# Patient Record
Sex: Female | Born: 1955 | Smoking: Never smoker
Health system: Southern US, Community
[De-identification: ages and names within clinical notes are randomized; demographics above are authoritative.]

## PROBLEM LIST (undated history)

## (undated) DIAGNOSIS — N289 Disorder of kidney and ureter, unspecified: Secondary | ICD-10-CM

## (undated) DIAGNOSIS — D638 Anemia in other chronic diseases classified elsewhere: Secondary | ICD-10-CM

## (undated) DIAGNOSIS — N186 End stage renal disease: Secondary | ICD-10-CM

## (undated) DIAGNOSIS — I4510 Unspecified right bundle-branch block: Secondary | ICD-10-CM

## (undated) DIAGNOSIS — E785 Hyperlipidemia, unspecified: Secondary | ICD-10-CM

## (undated) DIAGNOSIS — E1151 Type 2 diabetes mellitus with diabetic peripheral angiopathy without gangrene: Secondary | ICD-10-CM

## (undated) DIAGNOSIS — I10 Essential (primary) hypertension: Secondary | ICD-10-CM

## (undated) DIAGNOSIS — I350 Nonrheumatic aortic (valve) stenosis: Secondary | ICD-10-CM

## (undated) DIAGNOSIS — E11319 Type 2 diabetes mellitus with unspecified diabetic retinopathy without macular edema: Secondary | ICD-10-CM

## (undated) DIAGNOSIS — E118 Type 2 diabetes mellitus with unspecified complications: Secondary | ICD-10-CM

## (undated) DIAGNOSIS — E119 Type 2 diabetes mellitus without complications: Secondary | ICD-10-CM

## (undated) HISTORY — PX: OTHER SURGICAL HISTORY: SHX169

---

## 2021-11-27 ENCOUNTER — Other Ambulatory Visit: Payer: Self-pay | Admitting: Student

## 2021-11-27 DIAGNOSIS — N186 End stage renal disease: Secondary | ICD-10-CM

## 2021-12-02 ENCOUNTER — Ambulatory Visit
Admission: RE | Admit: 2021-12-02 | Discharge: 2021-12-02 | Disposition: A | Discharge status: Home / Self Care | Payer: Medicare PPO | Source: Ambulatory Visit | Attending: Student | Admitting: Student

## 2021-12-02 DIAGNOSIS — N186 End stage renal disease: Secondary | ICD-10-CM | POA: Insufficient documentation

## 2022-03-25 ENCOUNTER — Other Ambulatory Visit
Admission: RE | Admit: 2022-03-25 | Discharge: 2022-03-25 | Disposition: A | Discharge status: Home / Self Care | Source: Ambulatory Visit | Attending: Nephrology | Admitting: Nephrology

## 2022-03-25 DIAGNOSIS — N186 End stage renal disease: Secondary | ICD-10-CM | POA: Insufficient documentation

## 2022-03-26 LAB — MISC LABCORP TEST (SEND OUT): Labcorp test code: 6510

## 2022-11-25 ENCOUNTER — Emergency Department: Payer: Medicare PPO

## 2022-11-25 ENCOUNTER — Observation Stay
Admission: EM | Admit: 2022-11-25 | Discharge: 2022-11-26 | Disposition: A | Discharge status: Home / Self Care | Payer: Medicare PPO | Attending: Internal Medicine | Admitting: Internal Medicine

## 2022-11-25 ENCOUNTER — Other Ambulatory Visit: Payer: Self-pay

## 2022-11-25 DIAGNOSIS — Z992 Dependence on renal dialysis: Secondary | ICD-10-CM | POA: Diagnosis not present

## 2022-11-25 DIAGNOSIS — R109 Unspecified abdominal pain: Secondary | ICD-10-CM | POA: Insufficient documentation

## 2022-11-25 DIAGNOSIS — R911 Solitary pulmonary nodule: Secondary | ICD-10-CM | POA: Insufficient documentation

## 2022-11-25 DIAGNOSIS — D631 Anemia in chronic kidney disease: Secondary | ICD-10-CM

## 2022-11-25 DIAGNOSIS — I350 Nonrheumatic aortic (valve) stenosis: Secondary | ICD-10-CM | POA: Diagnosis not present

## 2022-11-25 DIAGNOSIS — R079 Chest pain, unspecified: Secondary | ICD-10-CM | POA: Diagnosis present

## 2022-11-25 DIAGNOSIS — E119 Type 2 diabetes mellitus without complications: Secondary | ICD-10-CM

## 2022-11-25 DIAGNOSIS — N186 End stage renal disease: Secondary | ICD-10-CM

## 2022-11-25 DIAGNOSIS — I7025 Atherosclerosis of native arteries of other extremities with ulceration: Secondary | ICD-10-CM | POA: Insufficient documentation

## 2022-11-25 DIAGNOSIS — R918 Other nonspecific abnormal finding of lung field: Secondary | ICD-10-CM

## 2022-11-25 DIAGNOSIS — K2289 Other specified disease of esophagus: Secondary | ICD-10-CM

## 2022-11-25 DIAGNOSIS — I10 Essential (primary) hypertension: Secondary | ICD-10-CM

## 2022-11-25 DIAGNOSIS — Z79899 Other long term (current) drug therapy: Secondary | ICD-10-CM | POA: Insufficient documentation

## 2022-11-25 DIAGNOSIS — I120 Hypertensive chronic kidney disease with stage 5 chronic kidney disease or end stage renal disease: Secondary | ICD-10-CM | POA: Diagnosis not present

## 2022-11-25 DIAGNOSIS — L98499 Non-pressure chronic ulcer of skin of other sites with unspecified severity: Secondary | ICD-10-CM | POA: Diagnosis not present

## 2022-11-25 DIAGNOSIS — E042 Nontoxic multinodular goiter: Secondary | ICD-10-CM

## 2022-11-25 DIAGNOSIS — E1122 Type 2 diabetes mellitus with diabetic chronic kidney disease: Secondary | ICD-10-CM | POA: Insufficient documentation

## 2022-11-25 DIAGNOSIS — R7989 Other specified abnormal findings of blood chemistry: Secondary | ICD-10-CM | POA: Insufficient documentation

## 2022-11-25 DIAGNOSIS — Z7984 Long term (current) use of oral hypoglycemic drugs: Secondary | ICD-10-CM | POA: Insufficient documentation

## 2022-11-25 DIAGNOSIS — I70209 Unspecified atherosclerosis of native arteries of extremities, unspecified extremity: Secondary | ICD-10-CM

## 2022-11-25 HISTORY — DX: Disorder of kidney and ureter, unspecified: N28.9

## 2022-11-25 HISTORY — DX: Essential (primary) hypertension: I10

## 2022-11-25 HISTORY — DX: Type 2 diabetes mellitus without complications: E11.9

## 2022-11-25 LAB — COMPREHENSIVE METABOLIC PANEL
ALT: 15 U/L (ref 0–44)
AST: 20 U/L (ref 15–41)
Albumin: 4 g/dL (ref 3.5–5.0)
Alkaline Phosphatase: 117 U/L (ref 38–126)
Anion gap: 12 (ref 5–15)
BUN: 52 mg/dL — ABNORMAL HIGH (ref 8–23)
CO2: 26 mmol/L (ref 22–32)
Calcium: 9.4 mg/dL (ref 8.9–10.3)
Chloride: 104 mmol/L (ref 98–111)
Creatinine, Ser: 6.94 mg/dL — ABNORMAL HIGH (ref 0.44–1.00)
GFR, Estimated: 6 mL/min — ABNORMAL LOW (ref >60)
Glucose, Bld: 144 mg/dL — ABNORMAL HIGH (ref 70–99)
Potassium: 3.9 mmol/L (ref 3.5–5.1)
Sodium: 142 mmol/L (ref 135–145)
Total Bilirubin: 0.5 mg/dL (ref 0.3–1.2)
Total Protein: 7.6 g/dL (ref 6.5–8.1)

## 2022-11-25 LAB — CBC
HCT: 31.4 % — ABNORMAL LOW (ref 36.0–46.0)
Hemoglobin: 9.5 g/dL — ABNORMAL LOW (ref 12.0–15.0)
MCH: 31 pg (ref 26.0–34.0)
MCHC: 30.3 g/dL (ref 30.0–36.0)
MCV: 102.6 fL — ABNORMAL HIGH (ref 80.0–100.0)
Platelets: 208 10*3/uL (ref 150–400)
RBC: 3.06 MIL/uL — ABNORMAL LOW (ref 3.87–5.11)
RDW: 14.9 % (ref 11.5–15.5)
WBC: 8.1 10*3/uL (ref 4.0–10.5)
nRBC: 0 % (ref 0.0–0.2)

## 2022-11-25 LAB — LIPASE, BLOOD: Lipase: 34 U/L (ref 11–51)

## 2022-11-25 LAB — TROPONIN I (HIGH SENSITIVITY)
Troponin I (High Sensitivity): 24 ng/L — ABNORMAL HIGH (ref <18)
Troponin I (High Sensitivity): 26 ng/L — ABNORMAL HIGH (ref <18)

## 2022-11-25 LAB — HEPATITIS B SURFACE ANTIGEN: Hepatitis B Surface Ag: NONREACTIVE

## 2022-11-25 MED ORDER — TRAZODONE HCL 50 MG PO TABS: 50.0000 mg | ORAL_TABLET | Freq: Every evening | ORAL | Status: DC | PRN

## 2022-11-25 MED ORDER — MORPHINE SULFATE (PF) 2 MG/ML IV SOLN: 2.0000 mg | INTRAVENOUS | Status: DC | PRN

## 2022-11-25 MED ORDER — PENTAFLUOROPROP-TETRAFLUOROETH EX AERO
INHALATION_SPRAY | CUTANEOUS | Status: AC
  Filled 2022-11-25: qty 30

## 2022-11-25 MED ORDER — ACETAMINOPHEN 325 MG PO TABS: 650.0000 mg | ORAL_TABLET | ORAL | Status: DC | PRN

## 2022-11-25 MED ORDER — INSULIN ASPART 100 UNIT/ML IJ SOLN: 0.0000 [IU] | Freq: Three times a day (TID) | INTRAMUSCULAR | Status: DC

## 2022-11-25 MED ORDER — HYDRALAZINE HCL 20 MG/ML IJ SOLN: 5.0000 mg | Freq: Three times a day (TID) | INTRAMUSCULAR | Status: DC | PRN

## 2022-11-25 MED ORDER — HEPARIN SODIUM (PORCINE) 5000 UNIT/ML IJ SOLN
5000.0000 [IU] | Freq: Three times a day (TID) | INTRAMUSCULAR | Status: DC
  Administered 2022-11-26 (×3): 5000 [IU] via SUBCUTANEOUS
  Filled 2022-11-25 (×3): qty 1

## 2022-11-25 MED ORDER — IOHEXOL 350 MG/ML SOLN
100.0000 mL | Freq: Once | INTRAVENOUS | Status: AC | PRN
  Administered 2022-11-25: 100 mL via INTRAVENOUS

## 2022-11-25 MED ORDER — INSULIN ASPART 100 UNIT/ML IJ SOLN: 0.0000 [IU] | Freq: Every day | INTRAMUSCULAR | Status: DC

## 2022-11-25 MED ORDER — FENTANYL CITRATE PF 50 MCG/ML IJ SOSY: 25.0000 ug | PREFILLED_SYRINGE | INTRAMUSCULAR | Status: DC | PRN

## 2022-11-25 MED ORDER — ONDANSETRON HCL 4 MG/2ML IJ SOLN: 4.0000 mg | Freq: Four times a day (QID) | INTRAMUSCULAR | Status: DC | PRN

## 2022-11-25 NOTE — ED Triage Notes (Signed)
Pt to ED [facility] from dialysis for abd pain radiating to chest and back that felt tearing in sensation.  50 mcg fentanyl IM PTA 1.5 in nitro paste to left chest Had 41 mins dialysis.  Reports chronic numbness tingling in arms and legs from neuropathy

## 2022-11-25 NOTE — ED Notes (Signed)
Pt OTF to dialysis

## 2022-11-25 NOTE — ED Notes (Signed)
Difficulty obtaining IV, Dr Lenard Lance aware

## 2022-11-25 NOTE — ED Notes (Signed)
This RN attempted IV access x 1 unsuccessfully. EDP to attempt IV ultrasound.

## 2022-11-25 NOTE — Assessment & Plan Note (Signed)
Patient completed dialysis yesterday.  Continue same schedule.

## 2022-11-25 NOTE — Assessment & Plan Note (Signed)
Started on Protonix for acid reflux, hiatal hernia.  CT scan of the chest negative for dissection.  Patient chest pain-free upon discharge.  High-sensitivity troponin only borderline at 24 and 26.

## 2022-11-25 NOTE — Assessment & Plan Note (Addendum)
Multiple, Measuring up to 5 mm, incidentally found on CT imaging.  Patient is not a smoker.  Can get a repeat CT scan of the chest in 1 year

## 2022-11-25 NOTE — ED Provider Notes (Signed)
[facility] Provider Note    Event Date/Time   First MD Initiated Contact with Patient 11/25/22 [phone]     (approximate)  History   Chief Complaint: Chest Pain  HPI  Christy Curry is a 67 y.o. female with a past medical history of diabetes, hypertension, presents emergency department for chest and abdominal pain.  According to the patient she was receiving dialysis this morning when she developed pain in center of her chest radiating into her abdomen and up to each shoulder.  Patient describes the pain as a "tearing" sensation to the chest.  Patient received fentanyl and nitroglycerin prior to arrival and states the pain is decreased from a 10/10 to a 2/10 currently.  Physical Exam   Triage Vital Signs: ED Triage Vitals  Enc Vitals Group     BP 11/25/22 0855 (!) 174/67     Pulse Rate 11/25/22 0854 95     Resp 11/25/22 0854 18     Temp 11/25/22 0854 98.4 F (36.9 C)     Temp src --      SpO2 11/25/22 0854 97 %     Weight 11/25/22 0855 147 lb (66.7 kg)     Height 11/25/22 0855 5\' 2"  (1.575 m)     Head Circumference --      Peak Flow --      Pain Score 11/25/22 0848 1     Pain Loc --      Pain Edu? --      Excl. in GC? --     Most recent vital signs: Vitals:   11/25/22 0854 11/25/22 0855  BP:  (!) 174/67  Pulse: 95   Resp: 18   Temp: 98.4 F (36.9 C)   SpO2: 97%     General: Awake, no distress.  CV:  Good peripheral perfusion.  Regular rate and rhythm  Resp:  Normal effort.  Equal breath sounds bilaterally.  Abd:  No distention.  Soft, nontender.  No rebound or guarding. Other:  Upper extremity AV fistula   ED Results / Procedures / Treatments   EKG  EKG viewed and interpreted by myself shows normal sinus rhythm at 93 bpm with a narrow QRS, left axis deviation, largely normal intervals, nonspecific ST changes without ST elevation  RADIOLOGY  I reviewed interpret the CT images no obvious aortic dissection or aneurysm seen on  my evaluation. Radiology is read the CT is negative for aneurysm or dissection there is a small ulcer in the left subclavian artery.   MEDICATIONS ORDERED IN ED: Medications - No data to display   IMPRESSION / MDM / ASSESSMENT AND PLAN / ED COURSE  I reviewed the triage vital signs and the nursing notes.  Patient's presentation is most consistent with acute presentation with potential threat to life or bodily function.  Patient presents emergency department for chest and abdominal pain described as a tearing sensation although much improved after fentanyl by EMS.  Given the patient's description of the pain starting in the chest radiating into the abdomen and both shoulders and a tearing sensation concerning for possible aortic syndrome I discussed the patient with Dr. Cherylann Ratel of nephrology who recommends proceeding with a CTA dissection protocol and he will arrange for the patient to be dialyzed later today as an inpatient.  Recommends admission to the hospitalist service which I believe is reasonable given the patient's high risk chest pain regardless.  Will proceed with CTA imaging I have placed an ultrasound-guided 20-gauge IV  to the right upper extremity.  CTA is negative for any significant abnormality.  Patient's troponin is slightly elevated at 24 however she has end-stage renal disease, repeat at 26.  CBC overall reassuring chronic anemia, chemistry reassuring.  Lipase is normal.  Given the patient's chest pain with high risk patient will admit to the hospitalist service for further workup and treatment.  Spoke with nephrology who will plan to dialyze the patient this afternoon.  FINAL CLINICAL IMPRESSION(S) / ED DIAGNOSES   Chest pain Abdominal pain    Note:  This document was prepared using Dragon voice recognition software and may include unintentional dictation errors.   [name], MD 11/25/22 1527

## 2022-11-25 NOTE — Assessment & Plan Note (Signed)
Per CT read as patulous, distal wall thickening, may reflect sequelae of chronic dysmotility and esophagitis.  Protonix prescribed.

## 2022-11-25 NOTE — Progress Notes (Signed)
[facility]  ROUNDING NOTE   Subjective:   Christy Curry is a 67 y.o. female with past medical history of diabetes, hypertension, sleep apnea, and end-stage renal disease on hemodialysis.  Patient presents to the emergency department from their dialysis clinic complaining of chest pain and has been admitted for Chest pain [R07.9]  Patient is known to our practice and receives outpatient dialysis treatments at [facility] on a MWF schedule, supervised by Dr. Cherylann Ratel.  Patient states she was at dialysis treatment when she began feeling a pain that started in her upper abdomen and quickly radiated to her chest and both arms.  Patient denies nausea or vomiting.  Denies shortness of breath.  States she only had the pain.  Currently not experiencing any pain at this time.  Room air, no lower extremity edema.  Denies cough.  Labs on ED arrival unremarkable for renal patient.  Troponin 24.  CT angio chest negative for aortic aneurysm or dissection, coronary artery calcifications noted, bilateral thyroid nodules noted, and multiple small lung nodules also noted.  Patient denies smoking history however husband had extensive tobacco use history but has stopped 38 years ago.  We have been consulted to manage dialysis needs.   Objective:  Vital signs in last 24 hours:  Temp:  [98.1 F (36.7 C)-98.4 F (36.9 C)] 98.2 F (36.8 C) (06/05 1516) Pulse Rate:  [71-95] 78 (06/05 1830) Resp:  [13-20] 14 (06/05 1830) BP: (113-185)/(55-91) 128/60 (06/05 1830) SpO2:  [96 %-100 %] 99 % (06/05 1830) Weight:  [66.7 kg] 66.7 kg (06/05 0855)  Weight change:  Filed Weights   11/25/22 0855  Weight: 66.7 kg    Intake/Output: No intake/output data recorded.   Intake/Output this shift:  No intake/output data recorded.  Physical Exam: General: NAD  Head: Normocephalic, atraumatic. Moist oral mucosal membranes  Eyes: Anicteric  Neck: Supple, trachea midline  Lungs:  Clear to  auscultation, normal effort  Heart: Regular rate and rhythm  Abdomen:  Soft, nontender,   Extremities: No peripheral edema.  Neurologic: Nonfocal, moving all four extremities  Skin: No lesions  Access: Left AVG    Basic Metabolic Panel: Recent Labs  Lab 11/25/22 0857  NA 142  K 3.9  CL 104  CO2 26  GLUCOSE 144*  BUN 52*  CREATININE 6.94*  CALCIUM 9.4    Liver Function Tests: Recent Labs  Lab 11/25/22 0857  AST 20  ALT 15  ALKPHOS 117  BILITOT 0.5  PROT 7.6  ALBUMIN 4.0   Recent Labs  Lab 11/25/22 0857  LIPASE 34   No results for input(s): "AMMONIA" in the last 168 hours.  CBC: Recent Labs  Lab 11/25/22 0857  WBC 8.1  HGB 9.5*  HCT 31.4*  MCV 102.6*  PLT 208    Cardiac Enzymes: No results for input(s): "CKTOTAL", "CKMB", "CKMBINDEX", "TROPONINI" in the last 168 hours.  BNP: Invalid input(s): "POCBNP"  CBG: No results for input(s): "GLUCAP" in the last 168 hours.  Microbiology: No results found for this or any previous visit.  Coagulation Studies: No results for input(s): "LABPROT", "INR" in the last 72 hours.  Urinalysis: No results for input(s): "COLORURINE", "LABSPEC", "PHURINE", "GLUCOSEU", "HGBUR", "BILIRUBINUR", "KETONESUR", "PROTEINUR", "UROBILINOGEN", "NITRITE", "LEUKOCYTESUR" in the last 72 hours.  Invalid input(s): "APPERANCEUR"    Imaging: CT Angio Chest/Abd/Pel for Dissection W and/or Wo Contrast  Result Date: 11/25/2022 CLINICAL DATA:  Acute aortic syndrome suspected. Patient reports from dialysis with abdominal pain that radiates to chest and back  EXAM: CT ANGIOGRAPHY CHEST, ABDOMEN AND PELVIS TECHNIQUE: Non-contrast CT of the chest was initially obtained. Multidetector CT imaging through the chest, abdomen and pelvis was performed using the standard protocol during bolus administration of intravenous contrast. Multiplanar reconstructed images and MIPs were obtained and reviewed to evaluate the vascular anatomy. RADIATION DOSE  REDUCTION: This exam was performed according to the departmental dose-optimization program which includes automated exposure control, adjustment of the mA and/or kV according to patient size and/or use of iterative reconstruction technique. CONTRAST:  OMNIPAQUE IOHEXOL 350 MG/ML SOLN COMPARISON:  None Available. FINDINGS: CTA CHEST FINDINGS Cardiovascular: Preferential opacification of the thoracic aorta. No evidence of thoracic aortic aneurysm or dissection. Normal heart size. No pericardial effusion. There is a small penetrating atherosclerotic ulcer involving the proximal left subclavian artery, image 30/6. Aortic atherosclerosis. Coronary artery calcifications. No signs of central obstructing pulmonary embolism. Mediastinum/Nodes: Bilateral thyroid nodules are identified. The largest arises off the inferior pole of the right lobe measuring 1.5 cm. Trachea appears patent and midline. Patulous esophagus may reflect sequelae of chronic dysmotility. Wall thickening of the distal esophagus is noted. No enlarged mediastinal or hilar lymph nodes. Lungs/Pleura: No pleural effusion, interstitial edema, or pneumothorax. Several small lung nodules are identified. The largest is in the posterior left apex measuring 5 mm, image 27/7. Musculoskeletal: No chest wall abnormality. No acute or significant osseous findings. Scattered endplate Schmorl node deformities identified within the lower thoracic spine. Review of the MIP images confirms the above findings. CTA ABDOMEN AND PELVIS FINDINGS VASCULAR Aorta: Normal caliber aorta without aneurysm, dissection, vasculitis or significant stenosis. Aortic atherosclerotic calcifications Celiac: Patent without evidence of aneurysm, dissection, vasculitis or significant stenosis. SMA: Patent without evidence of aneurysm, dissection, vasculitis or significant stenosis. Renals: Focal narrowing of the proximal left renal artery noted with at least 50% stenosis. Approximately 40%  stenosis is noted at the origin of the right renal artery. No signs of dissection or vasculitis. IMA: Patent without evidence of aneurysm, dissection, vasculitis or significant stenosis. Inflow: Patent without evidence of aneurysm, dissection, vasculitis or significant stenosis. Veins: No obvious venous abnormality within the limitations of this arterial phase study. Review of the MIP images confirms the above findings. NON-VASCULAR Hepatobiliary: Left lobe of liver appears atrophic. No focal liver lesions identified. Cluster of stones calcifications in the region of the gallbladder neck noted, image 134/6. No signs of gallbladder wall inflammation. No bile duct dilatation. Pancreas: Unremarkable. No pancreatic ductal dilatation or surrounding inflammatory changes. Spleen: Normal in size without focal abnormality. Adrenals/Urinary Tract: Normal adrenal glands. Bilateral renal cortical scarring and atrophy. No nephrolithiasis or obstructive uropathy. Small bilateral kidney cysts. The largest arises off the anterior cortex of the interpolar right kidney measuring 1 cm, image 160/6. No hydronephrosis. Urinary bladder appears normal. Stomach/Bowel: Moderate size hiatal hernia. No pathologic dilatation of the large or small bowel loops. The appendix is visualized and appears normal. No bowel wall thickening or inflammation. Lymphatic: No abdominopelvic adenopathy. Reproductive: Uterus and bilateral adnexa are unremarkable. Other: No free fluid or fluid collections. No signs of pneumoperitoneum. No acute or suspicious osseous findings. Superior endplate Schmorl's node deformities are identified at T12, L1, L2 and L4. Inferior endplate Schmorl's node is also noted at L2. Musculoskeletal: No acute or significant osseous findings. Review of the MIP images confirms the above findings. IMPRESSION: 1. No evidence for aortic aneurysm or dissection. 2. 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 3. Coronary artery calcifications. 4. Bilateral thyroid nodules. The largest nodule  is in the inferior pole of the right lobe measuring 1.5 cm. Recommend non-emergent thyroid ultrasound. Reference: J Am Coll Radiol. 2015 Feb;12(2): 143-50 5. Bilateral renal cortical scarring and atrophy. 6. Cholelithiasis. 7. Moderate size hiatal hernia. 8. Multiple small lung nodules measuring up to 5 mm. Most severe: 5 mm. Per Fleischner Society Guidelines, if patient is low risk for malignancy, no routine follow-up imaging is recommended. If patient is high risk for malignancy, a non-contrast Chest CT at 12 months is optional. If performed and the nodule is stable at 12 months, no further follow-up is recommended. 9. Patulous esophagus with distal wall thickening may reflect sequelae of chronic dysmotility and esophagitis. 10. Aortic Atherosclerosis (ICD10-I70.0). Electronically Signed   By: Signa Kell M.D.   On: 11/25/2022 11:30     Medications:     heparin  5,000 Units Subcutaneous Q8H   insulin aspart  0-5 Units Subcutaneous QHS   insulin aspart  0-6 Units Subcutaneous TID WC   acetaminophen, fentaNYL (SUBLIMAZE) injection, hydrALAZINE, morphine injection, ondansetron (ZOFRAN) IV, traZODone  Assessment/ Plan:  Ms. Christy Curry is a 67 y.o.  female with past medical history of diabetes, hypertension, sleep apnea, and end-stage renal disease on hemodialysis.  Patient presents to the emergency department from their dialysis clinic complaining of chest pain and has been admitted for Chest pain [R07.9]  [facility]/MWF/Lt AVG  End stage renal disease on hemodialysis. Received 41 minutes of dialysis prior to chest pain. Attempting 1.3L at treatment. If workup negative, will complete scheduled dialysis.   2. Anemia of chronic kidney disease Lab Results  Component Value Date   HGB 9.5 (L) 11/25/2022   Hgb within desired range, patent receives Mircera at outpatient clinic.   3.  Secondary Hyperparathyroidism: with outpatient labs: PTH 688, phosphorus 6.7, calcium 8.8 on 11/02/22.   Lab Results  Component Value Date   CALCIUM 9.4 11/25/2022    Patient prescribed calcitriol outpatient  4.Hypertension with chronic kidney disease. Home regimen includes amlodipine, carvedilol, and losartan. All currently held.    LOS: 0 [place] [facility]o 6/5/20246:47 PM

## 2022-11-25 NOTE — H&P (Addendum)
History and Physical   Christy Curry:[id number] DOB: [birth year] DOA: 11/25/2022  PCP: Margorie John, MD  Patient coming from: Hemodialysis center via EMS  I have personally briefly reviewed patient's old medical records in [facility] EMR.  Chief Concern: Chest pain, abdominal pain  HPI: [name] is a 67 year old female with history of end-stage renal disease on hemodialysis, hypertension, non-insulin-dependent diabetes mellitus, obstructive sleep apnea, history of bilateral vitreous hemorrhage of both eyes status post vitrectomy in September 2023, who presents to emergency department from dialysis center for chief concerns of abdominal pain.  Vitals in the ED showed temperature 98.4, respiration rate of 18, heart rate of 95, blood pressure 174/67, SpO2 97% on room air.  Serum sodium is 142, potassium 3.9, chloride 104, bicarb 26, BUN of 52, serum creatinine of 6.94, EGFR of 6, nonfasting blood glucose 144.  WBC 8.1, hemoglobin 9.5, platelets of 2 8.  High sensitive troponin is 24, on repeat was 26.  CT chest abdomen pelvis with contrast for dissection: Read as no evidence of aortic aneurysm or dissection.  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  Coronary artery calcification.  Bilateral thyroid nodules.  Bilateral renal cortical scarring and atrophy.  Cholelithiasis.  Moderate sized hiatal hernia.  Multiple small lung nodules.  ED treatment: None ------------------------- At bedside, she was able to tell me her name, age, current year, current location.   She reports that during HD, she developed epigastric region, radiating up to her chest to bilateral shoulders. She denies associated shortness of breath, nausea, vomiting.  She denies trauma to her person.  She reports the last time she had pain like this before was after receiving anesthesia for procedure.  She denies swelling to her lower extremities, fever,  chills, diarrhea, syncope, vision changes.  Social history: She lives at home with her spouse.  She denies history of tobacco, EtOH, recreational drug use.  She formerly worked at [employer].   ROS: Constitutional: no weight change, no fever ENT/Mouth: no sore throat, no rhinorrhea Eyes: no eye pain, no vision changes Cardiovascular: + chest pain, no dyspnea,  no edema, no palpitations Respiratory: no cough, no sputum, no wheezing Gastrointestinal: no nausea, no vomiting, no diarrhea, no constipation Genitourinary: no urinary incontinence, no dysuria, no hematuria Musculoskeletal: no arthralgias, no myalgias Skin: no skin lesions, no pruritus, Neuro: no  weakness, no loss of consciousness, no syncope Psych: no anxiety, no depression, no decrease appetite Heme/Lymph: no bruising, no bleeding  ED Course: Discussed with emergency medicine provider, patient requiring hospitalization for chief concerns of abdominal pain.  Assessment/Plan  Principal Problem:   Chest pain Active Problems:   Essential hypertension   Pulmonary nodules   Esophageal thickening   Anemia due to chronic kidney disease   Diabetes mellitus type 2, noninsulin dependent (HCC)   Elevated troponin   Atherosclerotic peripheral vascular disease with ulceration (HCC)   ESRD on dialysis (HCC)   Assessment and Plan:  * Chest pain Elevated high sensitive troponin Etiology workup in progress at this time differentials include ACS versus esophagitis Complete echo ordered Admit to telemetry cardiac, observation  ESRD on dialysis [facility]) Nephrology aware, patient received dialysis on day of admission  Elevated troponin Complete echo ordered AM team to consult cardiology pending complete echo  Diabetes mellitus type 2, noninsulin dependent (HCC) Home glipizide not resumed on admission Insulin SSI with agents coverage ordered Goal inpatient blood glucose levels 140-180  Esophageal thickening Per CT read  as patulous,  distal wall thickening, may reflect sequelae of chronic dysmotility and esophagitis  Pulmonary nodules Multiple Measuring up to 5 mm, incidentally found on CT imaging Discussed with patient at bedside and patient is aware  Chart reviewed.   DVT prophylaxis: 5000 units every 8 hours Code Status: Full code Diet: Renal/carb modified Family Communication: No Disposition Plan: pending clinical course, pending complete echo evaluation Consults called: Nephrolog for dialysis Admission status: Telemetry cardiac, observation  Past Medical History:  Diagnosis Date   Diabetes mellitus without complication (HCC)    Hypertension    Renal disorder    Past Surgical History:  Procedure Laterality Date   av fistula Left    Social History:  reports that she has never smoked. She has never used smokeless tobacco. She reports that she does not drink alcohol and does not use drugs.  Allergies  Allergen Reactions   2,4-D Dimethylamine Swelling   Tetanus Toxoids Itching   Family History  Problem Relation Age of Onset   Kidney disease Mother    Diabetes Father    Family history: Family history reviewed and not pertinent.  Prior to Admission medications   Medication Sig Start Date End Date Taking? Authorizing Provider  amLODipine (NORVASC) 5 MG tablet Take 5 mg by mouth daily. 06/02/19  Yes [provider]  b complex-vitamin c-folic acid (NEPHRO-VITE) 0.8 MG TABS tablet Take 1 tablet by mouth daily. 09/02/22  Yes [provider]  carvedilol (COREG) 25 MG tablet Take 1 tablet by mouth 2 (two) times daily with a meal. 03/29/19  Yes [provider]  glipiZIDE (GLUCOTROL XL) 2.5 MG 24 hr tablet Take 1 tablet by mouth daily. 07/27/22  Yes [provider]  losartan (COZAAR) 100 MG tablet Take 100 mg by mouth daily.    [provider]   Physical Exam: Vitals:   11/25/22 1600 11/25/22 1630 11/25/22 1700 11/25/22 1730  BP: (!) 169/71 (!) 141/71  131/73 137/76  Pulse: 71 74 76 74  Resp: 13 17 18 16   Temp:      TempSrc:      SpO2: 100% 99% 100% 100%  Weight:      Height:       Constitutional: appears age-appropriate, NAD , calm Eyes: PERRL, lids and conjunctivae normal ENMT: Mucous membranes are moist. Posterior pharynx clear of any exudate or lesions. Age-appropriate dentition. Hearing appropriate Neck: normal, supple, no masses, no thyromegaly Respiratory: clear to auscultation bilaterally, no wheezing, no crackles. Normal respiratory effort. No accessory muscle use.  Cardiovascular: Regular rate and rhythm, no murmurs / rubs / gallops. No extremity edema. 2+ pedal pulses. No carotid bruits.  Fistula with appropriate bruit in the left upper extremity Abdomen: Obese abdomen, no tenderness, no masses palpated, no hepatosplenomegaly. Bowel sounds positive.  Musculoskeletal: no clubbing / cyanosis. No joint deformity upper and lower extremities. Good ROM, no contractures, no atrophy. Normal muscle tone.  Skin: no rashes, lesions, ulcers. No induration Neurologic: Sensation intact. Strength 5/5 in all 4.  Psychiatric: Normal judgment and insight. Alert and oriented x 3. Normal mood.   EKG: independently reviewed, showing sinus rhythm with rate of 93, QTc 479  Chest x-ray on Admission: I personally reviewed and I agree with radiologist reading as below.  CT Angio Chest/Abd/Pel for Dissection W and/or Wo Contrast  Result Date: 11/25/2022 CLINICAL DATA:  Acute aortic syndrome suspected. Patient reports from dialysis with abdominal pain that radiates to chest and back EXAM: CT ANGIOGRAPHY CHEST, ABDOMEN AND PELVIS TECHNIQUE: Non-contrast CT of the  chest was initially obtained. Multidetector CT imaging through the chest, abdomen and pelvis was performed using the standard protocol during bolus administration of intravenous contrast. Multiplanar reconstructed images and MIPs were obtained and reviewed to evaluate the vascular anatomy.  RADIATION DOSE REDUCTION: This exam was performed according to the departmental dose-optimization program which includes automated exposure control, adjustment of the mA and/or kV according to patient size and/or use of iterative reconstruction technique. CONTRAST:  OMNIPAQUE IOHEXOL 350 MG/ML SOLN COMPARISON:  None Available. FINDINGS: CTA CHEST FINDINGS Cardiovascular: Preferential opacification of the thoracic aorta. No evidence of thoracic aortic aneurysm or dissection. Normal heart size. No pericardial effusion. There is a small penetrating atherosclerotic ulcer involving the proximal left subclavian artery, image 30/6. Aortic atherosclerosis. Coronary artery calcifications. No signs of central obstructing pulmonary embolism. Mediastinum/Nodes: Bilateral thyroid nodules are identified. The largest arises off the inferior pole of the right lobe measuring 1.5 cm. Trachea appears patent and midline. Patulous esophagus may reflect sequelae of chronic dysmotility. Wall thickening of the distal esophagus is noted. No enlarged mediastinal or hilar lymph nodes. Lungs/Pleura: No pleural effusion, interstitial edema, or pneumothorax. Several small lung nodules are identified. The largest is in the posterior left apex measuring 5 mm, image 27/7. Musculoskeletal: No chest wall abnormality. No acute or significant osseous findings. Scattered endplate Schmorl node deformities identified within the lower thoracic spine. Review of the MIP images confirms the above findings. CTA ABDOMEN AND PELVIS FINDINGS VASCULAR Aorta: Normal caliber aorta without aneurysm, dissection, vasculitis or significant stenosis. Aortic atherosclerotic calcifications Celiac: Patent without evidence of aneurysm, dissection, vasculitis or significant stenosis. SMA: Patent without evidence of aneurysm, dissection, vasculitis or significant stenosis. Renals: Focal narrowing of the proximal left renal artery noted with at least 50% stenosis.  Approximately 40% stenosis is noted at the origin of the right renal artery. No signs of dissection or vasculitis. IMA: Patent without evidence of aneurysm, dissection, vasculitis or significant stenosis. Inflow: Patent without evidence of aneurysm, dissection, vasculitis or significant stenosis. Veins: No obvious venous abnormality within the limitations of this arterial phase study. Review of the MIP images confirms the above findings. NON-VASCULAR Hepatobiliary: Left lobe of liver appears atrophic. No focal liver lesions identified. Cluster of stones calcifications in the region of the gallbladder neck noted, image 134/6. No signs of gallbladder wall inflammation. No bile duct dilatation. Pancreas: Unremarkable. No pancreatic ductal dilatation or surrounding inflammatory changes. Spleen: Normal in size without focal abnormality. Adrenals/Urinary Tract: Normal adrenal glands. Bilateral renal cortical scarring and atrophy. No nephrolithiasis or obstructive uropathy. Small bilateral kidney cysts. The largest arises off the anterior cortex of the interpolar right kidney measuring 1 cm, image 160/6. No hydronephrosis. Urinary bladder appears normal. Stomach/Bowel: Moderate size hiatal hernia. No pathologic dilatation of the large or small bowel loops. The appendix is visualized and appears normal. No bowel wall thickening or inflammation. Lymphatic: No abdominopelvic adenopathy. Reproductive: Uterus and bilateral adnexa are unremarkable. Other: No free fluid or fluid collections. No signs of pneumoperitoneum. No acute or suspicious osseous findings. Superior endplate Schmorl's node deformities are identified at T12, L1, L2 and L4. Inferior endplate Schmorl's node is also noted at L2. Musculoskeletal: No acute or significant osseous findings. Review of the MIP images confirms the above findings. IMPRESSION: 1. No evidence for aortic aneurysm or dissection. 2. Small penetrating atherosclerotic ulcer involving the  proximal left subclavian artery. 3. Coronary artery calcifications. 4. Bilateral thyroid nodules. The largest nodule is in the inferior pole of the right lobe measuring 1.5 cm.  Recommend non-emergent thyroid ultrasound. Reference: J Am Coll Radiol. 2015 Feb;12(2): 143-50 5. Bilateral renal cortical scarring and atrophy. 6. Cholelithiasis. 7. Moderate size hiatal hernia. 8. Multiple small lung nodules measuring up to 5 mm. Most severe: 5 mm. Per Fleischner Society Guidelines, if patient is low risk for malignancy, no routine follow-up imaging is recommended. If patient is high risk for malignancy, a non-contrast Chest CT at 12 months is optional. If performed and the nodule is stable at 12 months, no further follow-up is recommended. 9. Patulous esophagus with distal wall thickening may reflect sequelae of chronic dysmotility and esophagitis. 10. Aortic Atherosclerosis (ICD10-I70.0). Electronically Signed   By: Signa Kell M.D.   On: 11/25/2022 11:30    Labs on Admission: I have personally reviewed following labs  CBC: Recent Labs  Lab 11/25/22 0857  WBC 8.1  HGB 9.5*  HCT 31.4*  MCV 102.6*  PLT 208   Basic Metabolic Panel: Recent Labs  Lab 11/25/22 0857  NA 142  K 3.9  CL 104  CO2 26  GLUCOSE 144*  BUN 52*  CREATININE 6.94*  CALCIUM 9.4   GFR: Estimated Creatinine Clearance: 7 mL/min (A) (by C-G formula based on SCr of 6.94 mg/dL (H)).  Liver Function Tests: Recent Labs  Lab 11/25/22 0857  AST 20  ALT 15  ALKPHOS 117  BILITOT 0.5  PROT 7.6  ALBUMIN 4.0   Recent Labs  Lab 11/25/22 0857  LIPASE 34   This document was prepared using Dragon Voice Recognition software and may include unintentional dictation errors.  Dr. Sedalia Muta [facility]  If 7PM-7AM, please contact overnight-coverage provider If 7AM-7PM, please contact day attending provider [email or web address]  11/25/2022, 5:44 PM

## 2022-11-25 NOTE — Progress Notes (Signed)
Received patient in bed to unit.    Informed consent signed and in chart.    TX duration:3     Transported By hospital transport back to ER Hand-off given to patient's nurse.    Access used: L Graft Access issues:none    Total UF removed:1.5  Medication(s) given:None Post HD VS: Stable Post HD weight:65.2KG     [facility]

## 2022-11-25 NOTE — Hospital Course (Signed)
[name] is a 67 year old female with history of end-stage renal disease on hemodialysis, hypertension, non-insulin-dependent diabetes mellitus, obstructive sleep apnea, history of bilateral vitreous hemorrhage of both eyes status post vitrectomy in September 2023, who presents to emergency department from dialysis center for chief concerns of abdominal pain.  Vitals in the ED showed temperature 98.4, respiration rate of 18, heart rate of 95, blood pressure 174/67, SpO2 97% on room air.  Serum sodium is 142, potassium 3.9, chloride 104, bicarb 26, BUN of 52, serum creatinine of 6.94, EGFR of 6, nonfasting blood glucose 144.  WBC 8.1, hemoglobin 9.5, platelets of 2 8.  High sensitive troponin is 24, on repeat was 26.  CT chest abdomen pelvis with contrast for dissection: Read as no evidence of aortic aneurysm or dissection.  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  Coronary artery calcification.  Bilateral thyroid nodules.  Bilateral renal cortical scarring and atrophy.  Cholelithiasis.  Moderate sized hiatal hernia.  Multiple small lung nodules.  ED treatment: None

## 2022-11-25 NOTE — Assessment & Plan Note (Signed)
Complete echo ordered AM team to consult cardiology pending complete echo

## 2022-11-25 NOTE — ED Notes (Signed)
RN contacted lab to check on status of troponin, lab states troponin still running and should be finished soon.

## 2022-11-25 NOTE — Assessment & Plan Note (Signed)
Home glipizide not resumed on admission Insulin SSI with agents coverage ordered Goal inpatient blood glucose levels 140-180

## 2022-11-26 ENCOUNTER — Observation Stay (HOSPITAL_BASED_OUTPATIENT_CLINIC_OR_DEPARTMENT_OTHER)
Admit: 2022-11-26 | Discharge: 2022-11-26 | Disposition: A | Discharge status: Home / Self Care | Payer: Medicare PPO | Attending: Internal Medicine | Admitting: Internal Medicine

## 2022-11-26 DIAGNOSIS — R079 Chest pain, unspecified: Secondary | ICD-10-CM

## 2022-11-26 DIAGNOSIS — D631 Anemia in chronic kidney disease: Secondary | ICD-10-CM

## 2022-11-26 DIAGNOSIS — Z992 Dependence on renal dialysis: Secondary | ICD-10-CM

## 2022-11-26 DIAGNOSIS — E119 Type 2 diabetes mellitus without complications: Secondary | ICD-10-CM

## 2022-11-26 DIAGNOSIS — I350 Nonrheumatic aortic (valve) stenosis: Secondary | ICD-10-CM

## 2022-11-26 DIAGNOSIS — K2289 Other specified disease of esophagus: Secondary | ICD-10-CM

## 2022-11-26 DIAGNOSIS — I10 Essential (primary) hypertension: Secondary | ICD-10-CM

## 2022-11-26 DIAGNOSIS — R918 Other nonspecific abnormal finding of lung field: Secondary | ICD-10-CM

## 2022-11-26 DIAGNOSIS — R7989 Other specified abnormal findings of blood chemistry: Secondary | ICD-10-CM

## 2022-11-26 DIAGNOSIS — E042 Nontoxic multinodular goiter: Secondary | ICD-10-CM

## 2022-11-26 DIAGNOSIS — N186 End stage renal disease: Secondary | ICD-10-CM

## 2022-11-26 LAB — CBC
HCT: 31.3 % — ABNORMAL LOW (ref 36.0–46.0)
Hemoglobin: 9.6 g/dL — ABNORMAL LOW (ref 12.0–15.0)
MCH: 31.2 pg (ref 26.0–34.0)
MCHC: 30.7 g/dL (ref 30.0–36.0)
MCV: 101.6 fL — ABNORMAL HIGH (ref 80.0–100.0)
Platelets: 203 10*3/uL (ref 150–400)
RBC: 3.08 MIL/uL — ABNORMAL LOW (ref 3.87–5.11)
RDW: 14.8 % (ref 11.5–15.5)
WBC: 7.4 10*3/uL (ref 4.0–10.5)
nRBC: 0 % (ref 0.0–0.2)

## 2022-11-26 LAB — ECHOCARDIOGRAM COMPLETE
AR max vel: 1.07 cm2
AV Area VTI: 1.26 cm2
AV Area mean vel: 1.05 cm2
AV Mean grad: 18.3 mmHg
AV Peak grad: 32.8 mmHg
Ao pk vel: 2.86 m/s
Area-P 1/2: 2.48 cm2
Height: 62 in
MV VTI: 2.6 cm2
S' Lateral: 2.8 cm
Weight: 2299.84 oz

## 2022-11-26 LAB — HEPATITIS B SURFACE ANTIBODY, QUANTITATIVE: Hep B S AB Quant (Post): 56.7 m[IU]/mL (ref >9.9)

## 2022-11-26 LAB — BASIC METABOLIC PANEL
Anion gap: 10 (ref 5–15)
BUN: 32 mg/dL — ABNORMAL HIGH (ref 8–23)
CO2: 28 mmol/L (ref 22–32)
Calcium: 9.3 mg/dL (ref 8.9–10.3)
Chloride: 101 mmol/L (ref 98–111)
Creatinine, Ser: 5.51 mg/dL — ABNORMAL HIGH (ref 0.44–1.00)
GFR, Estimated: 8 mL/min — ABNORMAL LOW (ref >60)
Glucose, Bld: 135 mg/dL — ABNORMAL HIGH (ref 70–99)
Potassium: 4.3 mmol/L (ref 3.5–5.1)
Sodium: 139 mmol/L (ref 135–145)

## 2022-11-26 LAB — CBG MONITORING, ED
Glucose-Capillary: 148 mg/dL — ABNORMAL HIGH (ref 70–99)
Glucose-Capillary: 185 mg/dL — ABNORMAL HIGH (ref 70–99)
Glucose-Capillary: 94 mg/dL (ref 70–99)

## 2022-11-26 LAB — TSH: TSH: 4.992 u[IU]/mL — ABNORMAL HIGH (ref 0.350–4.500)

## 2022-11-26 MED ORDER — AMLODIPINE BESYLATE 5 MG PO TABS
5.0000 mg | ORAL_TABLET | Freq: Every day | ORAL | Status: DC
  Administered 2022-11-26: 5 mg via ORAL
  Filled 2022-11-26: qty 1

## 2022-11-26 MED ORDER — CARVEDILOL 6.25 MG PO TABS
25.0000 mg | ORAL_TABLET | Freq: Two times a day (BID) | ORAL | Status: DC
  Administered 2022-11-26: 25 mg via ORAL
  Filled 2022-11-26: qty 4

## 2022-11-26 MED ORDER — LOSARTAN POTASSIUM 50 MG PO TABS
100.0000 mg | ORAL_TABLET | Freq: Every day | ORAL | Status: DC
  Administered 2022-11-26: 100 mg via ORAL
  Filled 2022-11-26: qty 2

## 2022-11-26 MED ORDER — PANTOPRAZOLE SODIUM 40 MG PO TBEC
40.0000 mg | DELAYED_RELEASE_TABLET | Freq: Every day | ORAL | Status: DC
  Administered 2022-11-26: 40 mg via ORAL
  Filled 2022-11-26: qty 1

## 2022-11-26 MED ORDER — PANTOPRAZOLE SODIUM 40 MG PO TBEC: 40.0000 mg | DELAYED_RELEASE_TABLET | Freq: Every day | ORAL | 0 refills | Status: DC

## 2022-11-26 NOTE — Progress Notes (Signed)
*  PRELIMINARY RESULTS* Echocardiogram 2D Echocardiogram has been performed.  Christy Curry 11/26/2022, 8:44 AM

## 2022-11-26 NOTE — Assessment & Plan Note (Signed)
Moderate as per echocardiogram.  Follow-up with cardiology as outpatient.  Try not to take off too much fluid at a fast rate as per cardiology.

## 2022-11-26 NOTE — Discharge Summary (Signed)
Physician Discharge Summary   Patient: Christy Curry MRN: [id number] DOB: Nov 03, 1955  Admit date:     11/25/2022  Discharge date: 11/26/22  Discharge Physician: Alford Highland   PCP: Margorie John, MD   Recommendations at discharge:   Follow-up PCP 5 days Follow-up cardiology 3 to 4 weeks Can obtain a CT scan of the chest in 1 year to follow-up pulmonary nodules.  Discharge Diagnoses: Principal Problem:   Chest pain Active Problems:   Aortic stenosis   ESRD on dialysis [facility])   Essential hypertension   Anemia due to chronic kidney disease   Diabetes mellitus type 2, noninsulin dependent (HCC)   Pulmonary nodules   Atherosclerotic peripheral vascular disease with ulceration (HCC)   Esophageal thickening   Multiple thyroid nodules    Hospital Course: Christy Curry is a 67 year old female with history of end-stage renal disease on hemodialysis, hypertension, non-insulin-dependent diabetes mellitus, obstructive sleep apnea, history of bilateral vitreous hemorrhage of both eyes status post vitrectomy in September 2023, who presents to emergency department from dialysis center for chief concerns of abdominal pain.  Vitals in the ED showed temperature 98.4, respiration rate of 18, heart rate of 95, blood pressure 174/67, SpO2 97% on room air.  Serum sodium is 142, potassium 3.9, chloride 104, bicarb 26, BUN of 52, serum creatinine of 6.94, EGFR of 6, nonfasting blood glucose 144.  WBC 8.1, hemoglobin 9.5, platelets of 2 8.  High sensitive troponin is 24, on repeat was 26.  CT chest abdomen pelvis with contrast for dissection: Read as no evidence of aortic aneurysm or dissection.  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  Coronary artery calcification.  Bilateral thyroid nodules.  Bilateral renal cortical scarring and atrophy.  Cholelithiasis.  Moderate sized hiatal hernia.  Multiple small lung nodules.  6/6.  Patient  feeling better.  No chest pain or abdominal pain.  Patient describes the pain starting in her upper abdomen and going up into her chest and radiating to both shoulders.  Started with dialysis.  Resolved now.  Walk back and forth to the bathroom without any symptoms.  Echocardiogram showed a normal EF of 60% with moderate aortic stenosis.  Assessment and Plan: * Chest pain Started on Protonix for acid reflux, hiatal hernia.  CT scan of the chest negative for dissection.  Patient chest pain-free upon discharge.  High-sensitivity troponin only borderline at 24 and 26.  Aortic stenosis Moderate as per echocardiogram.  Follow-up with cardiology as outpatient.  Try not to take off too much fluid at a fast rate as per cardiology.  ESRD on dialysis [facility]) Patient completed dialysis yesterday.  Continue same schedule.  Essential hypertension Continue Norvasc Coreg and losartan  Anemia due to chronic kidney disease Hemoglobin stable at 9.6.  Diabetes mellitus type 2, noninsulin dependent (HCC) Can go back on glipizide but careful watching sugars as outpatient.  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  Case discussed with vascular surgery on-call and recommended outpatient follow-up.  The patient wanted to follow-up with her vascular surgeon.  Pulmonary nodules Multiple, Measuring up to 5 mm, incidentally found on CT imaging.  Patient is not a smoker.  Can get a repeat CT scan of the chest in 1 year  Esophageal thickening Per CT read as patulous, distal wall thickening, may reflect sequelae of chronic dysmotility and esophagitis.  Protonix prescribed.  Multiple thyroid nodules Patient already knows about this and has  had biopsies in the past.  TSH slightly elevated 4.992.  Continue to watch as outpatient.         Consultants: Nephrology.  (Case discussed with cardiology and  vascular surgery). Procedures performed: Dialysis Disposition: Home Diet recommendation:  Cardiac and Carb modified diet DISCHARGE MEDICATION: Allergies as of 11/26/2022       Reactions   2,4-d Dimethylamine Swelling   Tetanus Toxoids Itching        Medication List     TAKE these medications    amLODipine 5 MG tablet Commonly known as: NORVASC Take 5 mg by mouth daily.   b complex-vitamin c-folic acid 0.8 MG Tabs tablet Take 1 tablet by mouth daily.   carvedilol 25 MG tablet Commonly known as: COREG Take 25 mg by mouth 2 (two) times daily with a meal.   glipiZIDE 2.5 MG 24 hr tablet Commonly known as: GLUCOTROL XL Take 2.5 mg by mouth daily.   losartan 100 MG tablet Commonly known as: COZAAR Take 100 mg by mouth daily.   pantoprazole 40 MG tablet Commonly known as: PROTONIX Take 1 tablet (40 mg total) by mouth daily. Start taking on: November 27, 2022        Follow-up Information     Margorie John, MD Follow up in 5 day(s).   Specialty: Internal Medicine Contact information: [address] [place] [address] [phone]         Antonieta Iba, MD Follow up in 3 week(s).   Specialty: Cardiology Contact information: [address] [place] [address] [place] [address] [phone]                Discharge Exam: Ceasar Mons Weights   11/25/22 0855 11/25/22 1916  Weight: 66.7 kg 65.2 kg   Physical Exam HENT:     Head: Normocephalic.     Mouth/Throat:     Pharynx: No oropharyngeal exudate.  Eyes:     General: Lids are normal.     Conjunctiva/sclera: Conjunctivae normal.  Cardiovascular:     Rate and Rhythm: Normal rate and regular rhythm.     Heart sounds: S1 normal and S2 normal. Murmur heard.     Systolic murmur is present with a grade of 2/6.  Pulmonary:     Breath sounds: No decreased breath sounds, wheezing, rhonchi or rales.  Abdominal:     Palpations: Abdomen is soft.     Tenderness: There is no abdominal tenderness.   Musculoskeletal:     Right lower leg: No swelling.     Left lower leg: No swelling.  Skin:    General: Skin is warm.     Findings: No rash.  Neurological:     Mental Status: She is alert and oriented to person, place, and time.      Condition at discharge: stable  The results of significant diagnostics from this hospitalization (including imaging, microbiology, ancillary and laboratory) are listed below for reference.   Imaging Studies: ECHOCARDIOGRAM COMPLETE  Result Date: 11/26/2022    ECHOCARDIOGRAM REPORT   Patient Name:   Christy Curry Date of Exam: 11/26/2022 Medical Rec #:  [id number]               Height:       62.0 in Accession #:    [id number]              Weight:       143.7 lb Date of Birth:  [birth year]  BSA:          1.661 m Patient Age:    66 years                BP:           140/46 mmHg Patient Gender: F                       HR:           70 bpm. Exam Location:  [facility] Procedure: 2D Echo, Color Doppler and Cardiac Doppler Indications:     Chest pain R07.9                  Elevated Troponin  History:         Patient has no prior history of Echocardiogram examinations.                  Risk Factors:Diabetes and Hypertension.  Sonographer:     Cristela Blue Referring Phys:  [id number] AMY N COX Diagnosing Phys: Julien Nordmann MD IMPRESSIONS  1. Left ventricular ejection fraction, by estimation, is 60 to 65%. The left ventricle has normal function. The left ventricle has no regional wall motion abnormalities. There is moderate left ventricular hypertrophy. Left ventricular diastolic parameters are consistent with Grade I diastolic dysfunction (impaired relaxation).  2. Right ventricular systolic function is normal. The right ventricular size is normal. There is normal pulmonary artery systolic pressure. The estimated right ventricular systolic pressure is 24.5 mmHg.  3. The mitral valve is normal in structure. No evidence of mitral valve regurgitation. No evidence of  mitral stenosis.  4. The aortic valve is normal in structure. There is severe calcifcation of the aortic valve. Aortic valve regurgitation is not visualized. Moderate aortic valve stenosis. Aortic valve area, by VTI measures 1.26 cm. Aortic valve mean gradient measures 18.3 mmHg. Aortic valve Vmax measures 2.86 m/s.  5. The inferior vena cava is normal in size with greater than 50% respiratory variability, suggesting right atrial pressure of 3 mmHg. FINDINGS  Left Ventricle: Left ventricular ejection fraction, by estimation, is 60 to 65%. The left ventricle has normal function. The left ventricle has no regional wall motion abnormalities. The left ventricular internal cavity size was normal in size. There is  moderate left ventricular hypertrophy. Left ventricular diastolic parameters are consistent with Grade I diastolic dysfunction (impaired relaxation). Right Ventricle: The right ventricular size is normal. No increase in right ventricular wall thickness. Right ventricular systolic function is normal. There is normal pulmonary artery systolic pressure. The tricuspid regurgitant velocity is 2.21 m/s, and  with an assumed right atrial pressure of 5 mmHg, the estimated right ventricular systolic pressure is 24.5 mmHg. Left Atrium: Left atrial size was normal in size. Right Atrium: Right atrial size was normal in size. Pericardium: There is no evidence of pericardial effusion. Mitral Valve: The mitral valve is normal in structure. Mild mitral annular calcification. No evidence of mitral valve regurgitation. No evidence of mitral valve stenosis. MV peak gradient, 7.4 mmHg. The mean mitral valve gradient is 3.0 mmHg. Tricuspid Valve: The tricuspid valve is normal in structure. Tricuspid valve regurgitation is not demonstrated. No evidence of tricuspid stenosis. Aortic Valve: The aortic valve is normal in structure. There is severe calcifcation of the aortic valve. Aortic valve regurgitation is not visualized. Moderate  aortic stenosis is present. Aortic valve mean gradient measures 18.3 mmHg. Aortic valve peak gradient measures 32.8 mmHg. Aortic valve area, by VTI measures 1.26  cm. Pulmonic Valve: The pulmonic valve was normal in structure. Pulmonic valve regurgitation is not visualized. No evidence of pulmonic stenosis. Aorta: The aortic root is normal in size and structure. Venous: The inferior vena cava is normal in size with greater than 50% respiratory variability, suggesting right atrial pressure of 3 mmHg. IAS/Shunts: No atrial level shunt detected by color flow Doppler.  LEFT VENTRICLE PLAX 2D LVIDd:         4.20 cm   Diastology LVIDs:         2.80 cm   LV e' medial:    6.31 cm/s LV PW:         1.10 cm   LV E/e' medial:  16.0 LV IVS:        1.50 cm   LV e' lateral:   7.40 cm/s LVOT diam:     2.00 cm   LV E/e' lateral: 13.6 LV SV:         76 LV SV Index:   46 LVOT Area:     3.14 cm  RIGHT VENTRICLE RV Basal diam:  3.50 cm RV Mid diam:    2.30 cm RV S prime:     10.70 cm/s TAPSE (M-mode): 1.8 cm LEFT ATRIUM             Index        RIGHT ATRIUM          Index LA diam:        3.50 cm 2.11 cm/m   RA Area:     8.87 cm LA Vol (A2C):   29.5 ml 17.76 ml/m  RA Volume:   16.80 ml 10.11 ml/m LA Vol (A4C):   38.9 ml 23.42 ml/m LA Biplane Vol: 34.7 ml 20.89 ml/m  AORTIC VALVE AV Area (Vmax):    1.07 cm AV Area (Vmean):   1.05 cm AV Area (VTI):     1.26 cm AV Vmax:           286.33 cm/s AV Vmean:          194.333 cm/s AV VTI:            0.604 m AV Peak Grad:      32.8 mmHg AV Mean Grad:      18.3 mmHg LVOT Vmax:         97.40 cm/s LVOT Vmean:        64.900 cm/s LVOT VTI:          0.243 m LVOT/AV VTI ratio: 0.40  AORTA Ao Root diam: 2.60 cm MITRAL VALVE                TRICUSPID VALVE MV Area (PHT): 2.48 cm     TR Peak grad:   19.5 mmHg MV Area VTI:   2.60 cm     TR Vmax:        221.00 cm/s MV Peak grad:  7.4 mmHg MV Mean grad:  3.0 mmHg     SHUNTS MV Vmax:       1.36 m/s     Systemic VTI:  0.24 m MV Vmean:      73.7 cm/s     Systemic Diam: 2.00 cm MV Decel Time: 306 msec MV E velocity: 101.00 cm/s MV A velocity: 134.00 cm/s MV E/A ratio:  0.75 Julien Nordmann MD Electronically signed by Julien Nordmann MD Signature Date/Time: 11/26/2022/12:57:47 PM    Final    CT Angio Chest/Abd/Pel for Dissection W and/or Wo Contrast  Result Date:  11/25/2022 CLINICAL DATA:  Acute aortic syndrome suspected. Patient reports from dialysis with abdominal pain that radiates to chest and back EXAM: CT ANGIOGRAPHY CHEST, ABDOMEN AND PELVIS TECHNIQUE: Non-contrast CT of the chest was initially obtained. Multidetector CT imaging through the chest, abdomen and pelvis was performed using the standard protocol during bolus administration of intravenous contrast. Multiplanar reconstructed images and MIPs were obtained and reviewed to evaluate the vascular anatomy. RADIATION DOSE REDUCTION: This exam was performed according to the departmental dose-optimization program which includes automated exposure control, adjustment of the mA and/or kV according to patient size and/or use of iterative reconstruction technique. CONTRAST:  OMNIPAQUE IOHEXOL 350 MG/ML SOLN COMPARISON:  None Available. FINDINGS: CTA CHEST FINDINGS Cardiovascular: Preferential opacification of the thoracic aorta. No evidence of thoracic aortic aneurysm or dissection. Normal heart size. No pericardial effusion. There is a small penetrating atherosclerotic ulcer involving the proximal left subclavian artery, image 30/6. Aortic atherosclerosis. Coronary artery calcifications. No signs of central obstructing pulmonary embolism. Mediastinum/Nodes: Bilateral thyroid nodules are identified. The largest arises off the inferior pole of the right lobe measuring 1.5 cm. Trachea appears patent and midline. Patulous esophagus may reflect sequelae of chronic dysmotility. Wall thickening of the distal esophagus is noted. No enlarged mediastinal or hilar lymph nodes. Lungs/Pleura: No pleural effusion,  interstitial edema, or pneumothorax. Several small lung nodules are identified. The largest is in the posterior left apex measuring 5 mm, image 27/7. Musculoskeletal: No chest wall abnormality. No acute or significant osseous findings. Scattered endplate Schmorl node deformities identified within the lower thoracic spine. Review of the MIP images confirms the above findings. CTA ABDOMEN AND PELVIS FINDINGS VASCULAR Aorta: Normal caliber aorta without aneurysm, dissection, vasculitis or significant stenosis. Aortic atherosclerotic calcifications Celiac: Patent without evidence of aneurysm, dissection, vasculitis or significant stenosis. SMA: Patent without evidence of aneurysm, dissection, vasculitis or significant stenosis. Renals: Focal narrowing of the proximal left renal artery noted with at least 50% stenosis. Approximately 40% stenosis is noted at the origin of the right renal artery. No signs of dissection or vasculitis. IMA: Patent without evidence of aneurysm, dissection, vasculitis or significant stenosis. Inflow: Patent without evidence of aneurysm, dissection, vasculitis or significant stenosis. Veins: No obvious venous abnormality within the limitations of this arterial phase study. Review of the MIP images confirms the above findings. NON-VASCULAR Hepatobiliary: Left lobe of liver appears atrophic. No focal liver lesions identified. Cluster of stones calcifications in the region of the gallbladder neck noted, image 134/6. No signs of gallbladder wall inflammation. No bile duct dilatation. Pancreas: Unremarkable. No pancreatic ductal dilatation or surrounding inflammatory changes. Spleen: Normal in size without focal abnormality. Adrenals/Urinary Tract: Normal adrenal glands. Bilateral renal cortical scarring and atrophy. No nephrolithiasis or obstructive uropathy. Small bilateral kidney cysts. The largest arises off the anterior cortex of the interpolar right kidney measuring 1 cm, image 160/6. No  hydronephrosis. Urinary bladder appears normal. Stomach/Bowel: Moderate size hiatal hernia. No pathologic dilatation of the large or small bowel loops. The appendix is visualized and appears normal. No bowel wall thickening or inflammation. Lymphatic: No abdominopelvic adenopathy. Reproductive: Uterus and bilateral adnexa are unremarkable. Other: No free fluid or fluid collections. No signs of pneumoperitoneum. No acute or suspicious osseous findings. Superior endplate Schmorl's node deformities are identified at T12, L1, L2 and L4. Inferior endplate Schmorl's node is also noted at L2. Musculoskeletal: No acute or significant osseous findings. Review of the MIP images confirms the above findings. IMPRESSION: 1. No evidence for aortic aneurysm or dissection. 2. Small  penetrating atherosclerotic ulcer involving the proximal left subclavian artery. 3. Coronary artery calcifications. 4. Bilateral thyroid nodules. The largest nodule is in the inferior pole of the right lobe measuring 1.5 cm. Recommend non-emergent thyroid ultrasound. Reference: J Am Coll Radiol. 2015 Feb;12(2): 143-50 5. Bilateral renal cortical scarring and atrophy. 6. Cholelithiasis. 7. Moderate size hiatal hernia. 8. Multiple small lung nodules measuring up to 5 mm. Most severe: 5 mm. Per Fleischner Society Guidelines, if patient is low risk for malignancy, no routine follow-up imaging is recommended. If patient is high risk for malignancy, a non-contrast Chest CT at 12 months is optional. If performed and the nodule is stable at 12 months, no further follow-up is recommended. 9. Patulous esophagus with distal wall thickening may reflect sequelae of chronic dysmotility and esophagitis. 10. Aortic Atherosclerosis (ICD10-I70.0). Electronically Signed   By: Signa Kell M.D.   On: 11/25/2022 11:30    Microbiology: No results found for this or any previous visit.  Labs: CBC: Recent Labs  Lab 11/25/22 0857 11/26/22 0904  WBC 8.1 7.4  HGB 9.5*  9.6*  HCT 31.4* 31.3*  MCV 102.6* 101.6*  PLT 208 203   Basic Metabolic Panel: Recent Labs  Lab 11/25/22 0857 11/26/22 0904  NA 142 139  K 3.9 4.3  CL 104 101  CO2 26 28  GLUCOSE 144* 135*  BUN 52* 32*  CREATININE 6.94* 5.51*  CALCIUM 9.4 9.3   Liver Function Tests: Recent Labs  Lab 11/25/22 0857  AST 20  ALT 15  ALKPHOS 117  BILITOT 0.5  PROT 7.6  ALBUMIN 4.0   CBG: Recent Labs  Lab 11/26/22 0006 11/26/22 0751 11/26/22 1119  GLUCAP 185* 94 148*    Discharge time spent: greater than 30 minutes.  Signed: Alford Highland, MD [facility] 11/26/2022

## 2022-11-26 NOTE — Discharge Instructions (Addendum)
Pulmonary Nodules:  can repeat ct scan in 12 months Follow up with you vascular surgeon in 3- 4 weeks to review CT scan for ulcer left subclavian artery

## 2022-11-26 NOTE — Assessment & Plan Note (Signed)
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  Case discussed with vascular surgery on-call and recommended outpatient follow-up.  The patient wanted to follow-up with her vascular surgeon.

## 2022-11-26 NOTE — Progress Notes (Signed)
[facility]  ROUNDING NOTE   Subjective:   Christy Curry is a 67 y.o. female with past medical history of diabetes, hypertension, sleep apnea, and end-stage renal disease on hemodialysis.  Patient presents to the emergency department from their dialysis clinic complaining of chest pain and has been admitted for Chest pain [R07.9]  Patient is known to our practice and receives outpatient dialysis treatments at [facility] on a MWF schedule, supervised by Dr. Cherylann Ratel.    Patient sitting up in bed Alert and oriented  Remains on room air No lower extremity edema  Denies chest pain  Objective:  Vital signs in last 24 hours:  Temp:  [97.7 F (36.5 C)-98.3 F (36.8 C)] 97.9 F (36.6 C) (06/06 0915) Pulse Rate:  [65-88] 72 (06/06 0915) Resp:  [13-20] 18 (06/06 0915) BP: (113-185)/(42-91) 134/51 (06/06 0955) SpO2:  [96 %-100 %] 99 % (06/06 0915) Weight:  [65.2 kg] 65.2 kg (06/05 1916)  Weight change:  Filed Weights   11/25/22 0855 11/25/22 1916  Weight: 66.7 kg 65.2 kg    Intake/Output: I/O last 3 completed shifts: In: -  Out: 1500 [Other:1500]   Intake/Output this shift:  No intake/output data recorded.  Physical Exam: General: NAD  Head: Normocephalic, atraumatic. Moist oral mucosal membranes  Eyes: Anicteric  Neck: Supple, trachea midline  Lungs:  Clear to auscultation, normal effort  Heart: Regular rate and rhythm  Abdomen:  Soft, nontender,   Extremities: No peripheral edema.  Neurologic: Nonfocal, moving all four extremities  Skin: No lesions  Access: Left AVG    Basic Metabolic Panel: Recent Labs  Lab 11/25/22 0857 11/26/22 0904  NA 142 139  K 3.9 4.3  CL 104 101  CO2 26 28  GLUCOSE 144* 135*  BUN 52* 32*  CREATININE 6.94* 5.51*  CALCIUM 9.4 9.3     Liver Function Tests: Recent Labs  Lab 11/25/22 0857  AST 20  ALT 15  ALKPHOS 117  BILITOT 0.5  PROT 7.6  ALBUMIN 4.0    Recent Labs  Lab 11/25/22 0857   LIPASE 34    No results for input(s): "AMMONIA" in the last 168 hours.  CBC: Recent Labs  Lab 11/25/22 0857 11/26/22 0904  WBC 8.1 7.4  HGB 9.5* 9.6*  HCT 31.4* 31.3*  MCV 102.6* 101.6*  PLT 208 203     Cardiac Enzymes: No results for input(s): "CKTOTAL", "CKMB", "CKMBINDEX", "TROPONINI" in the last 168 hours.  BNP: Invalid input(s): "POCBNP"  CBG: Recent Labs  Lab 11/26/22 0006 11/26/22 0751 11/26/22 1119  GLUCAP 185* 94 148*    Microbiology: No results found for this or any previous visit.  Coagulation Studies: No results for input(s): "LABPROT", "INR" in the last 72 hours.  Urinalysis: No results for input(s): "COLORURINE", "LABSPEC", "PHURINE", "GLUCOSEU", "HGBUR", "BILIRUBINUR", "KETONESUR", "PROTEINUR", "UROBILINOGEN", "NITRITE", "LEUKOCYTESUR" in the last 72 hours.  Invalid input(s): "APPERANCEUR"    Imaging: CT Angio Chest/Abd/Pel for Dissection W and/or Wo Contrast  Result Date: 11/25/2022 CLINICAL DATA:  Acute aortic syndrome suspected. Patient reports from dialysis with abdominal pain that radiates to chest and back EXAM: CT ANGIOGRAPHY CHEST, ABDOMEN AND PELVIS TECHNIQUE: Non-contrast CT of the chest was initially obtained. Multidetector CT imaging through the chest, abdomen and pelvis was performed using the standard protocol during bolus administration of intravenous contrast. Multiplanar reconstructed images and MIPs were obtained and reviewed to evaluate the vascular anatomy. RADIATION DOSE REDUCTION: This exam was performed according to the departmental dose-optimization program which includes automated exposure  control, adjustment of the mA and/or kV according to patient size and/or use of iterative reconstruction technique. CONTRAST:  OMNIPAQUE IOHEXOL 350 MG/ML SOLN COMPARISON:  None Available. FINDINGS: CTA CHEST FINDINGS Cardiovascular: Preferential opacification of the thoracic aorta. No evidence of thoracic aortic aneurysm or dissection.  Normal heart size. No pericardial effusion. There is a small penetrating atherosclerotic ulcer involving the proximal left subclavian artery, image 30/6. Aortic atherosclerosis. Coronary artery calcifications. No signs of central obstructing pulmonary embolism. Mediastinum/Nodes: Bilateral thyroid nodules are identified. The largest arises off the inferior pole of the right lobe measuring 1.5 cm. Trachea appears patent and midline. Patulous esophagus may reflect sequelae of chronic dysmotility. Wall thickening of the distal esophagus is noted. No enlarged mediastinal or hilar lymph nodes. Lungs/Pleura: No pleural effusion, interstitial edema, or pneumothorax. Several small lung nodules are identified. The largest is in the posterior left apex measuring 5 mm, image 27/7. Musculoskeletal: No chest wall abnormality. No acute or significant osseous findings. Scattered endplate Schmorl node deformities identified within the lower thoracic spine. Review of the MIP images confirms the above findings. CTA ABDOMEN AND PELVIS FINDINGS VASCULAR Aorta: Normal caliber aorta without aneurysm, dissection, vasculitis or significant stenosis. Aortic atherosclerotic calcifications Celiac: Patent without evidence of aneurysm, dissection, vasculitis or significant stenosis. SMA: Patent without evidence of aneurysm, dissection, vasculitis or significant stenosis. Renals: Focal narrowing of the proximal left renal artery noted with at least 50% stenosis. Approximately 40% stenosis is noted at the origin of the right renal artery. No signs of dissection or vasculitis. IMA: Patent without evidence of aneurysm, dissection, vasculitis or significant stenosis. Inflow: Patent without evidence of aneurysm, dissection, vasculitis or significant stenosis. Veins: No obvious venous abnormality within the limitations of this arterial phase study. Review of the MIP images confirms the above findings. NON-VASCULAR Hepatobiliary: Left lobe of liver  appears atrophic. No focal liver lesions identified. Cluster of stones calcifications in the region of the gallbladder neck noted, image 134/6. No signs of gallbladder wall inflammation. No bile duct dilatation. Pancreas: Unremarkable. No pancreatic ductal dilatation or surrounding inflammatory changes. Spleen: Normal in size without focal abnormality. Adrenals/Urinary Tract: Normal adrenal glands. Bilateral renal cortical scarring and atrophy. No nephrolithiasis or obstructive uropathy. Small bilateral kidney cysts. The largest arises off the anterior cortex of the interpolar right kidney measuring 1 cm, image 160/6. No hydronephrosis. Urinary bladder appears normal. Stomach/Bowel: Moderate size hiatal hernia. No pathologic dilatation of the large or small bowel loops. The appendix is visualized and appears normal. No bowel wall thickening or inflammation. Lymphatic: No abdominopelvic adenopathy. Reproductive: Uterus and bilateral adnexa are unremarkable. Other: No free fluid or fluid collections. No signs of pneumoperitoneum. No acute or suspicious osseous findings. Superior endplate Schmorl's node deformities are identified at T12, L1, L2 and L4. Inferior endplate Schmorl's node is also noted at L2. Musculoskeletal: No acute or significant osseous findings. Review of the MIP images confirms the above findings. IMPRESSION: 1. No evidence for aortic aneurysm or dissection. 2. 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 3. Coronary artery calcifications. 4. Bilateral thyroid nodules. The largest nodule is in the inferior pole of the right lobe measuring 1.5 cm. Recommend non-emergent thyroid ultrasound. Reference: J Am Coll Radiol. 2015 Feb;12(2): 143-50 5. Bilateral renal cortical scarring and atrophy. 6. Cholelithiasis. 7. Moderate size hiatal hernia. 8. Multiple small lung nodules measuring up to 5 mm. Most severe: 5 mm. Per Fleischner Society Guidelines, if patient is  low risk for malignancy, no routine follow-up imaging is  recommended. If patient is high risk for malignancy, a non-contrast Chest CT at 12 months is optional. If performed and the nodule is stable at 12 months, no further follow-up is recommended. 9. Patulous esophagus with distal wall thickening may reflect sequelae of chronic dysmotility and esophagitis. 10. Aortic Atherosclerosis (ICD10-I70.0). Electronically Signed   By: Signa Kell M.D.   On: 11/25/2022 11:30     Medications:     amLODipine  5 mg Oral Daily   carvedilol  25 mg Oral BID WC   heparin  5,000 Units Subcutaneous Q8H   insulin aspart  0-5 Units Subcutaneous QHS   insulin aspart  0-6 Units Subcutaneous TID WC   losartan  100 mg Oral Daily   pantoprazole  40 mg Oral Daily   acetaminophen, hydrALAZINE, ondansetron (ZOFRAN) IV, traZODone  Assessment/ Plan:  Christy Curry is a 67 y.o.  female with past medical history of diabetes, hypertension, sleep apnea, and end-stage renal disease on hemodialysis.  Patient presents to the emergency department from their dialysis clinic complaining of chest pain and has been admitted for Chest pain [R07.9]  [facility]/MWF/Lt AVG  End stage renal disease on hemodialysis.  Dialysis received yesterday with UF 1.5L achieved. Patient tolerated treatment well without chest pain.  Next treatment scheduled for Friday.   2. Anemia of chronic kidney disease Lab Results  Component Value Date   HGB 9.6 (L) 11/26/2022   Hgb stable. Patent receives Mircera at outpatient clinic.   3. Secondary Hyperparathyroidism: with outpatient labs: PTH 688, phosphorus 6.7, calcium 8.8 on 11/02/22.   Lab Results  Component Value Date   CALCIUM 9.3 11/26/2022    Patient prescribed calcitriol outpatient  4.Hypertension with chronic kidney disease. Home regimen includes amlodipine, carvedilol, and losartan. All medications restarted.    LOS: 0 Christy Curry 6/6/202412:40 PM

## 2022-11-26 NOTE — Assessment & Plan Note (Signed)
Continue Norvasc Coreg and losartan

## 2022-11-26 NOTE — Assessment & Plan Note (Signed)
Hemoglobin stable at 9.6.

## 2022-11-26 NOTE — ED Notes (Addendum)
Pt up and ambulated to and from the bathroom. Pt able to perform ADL's independently. Pt provided cup of ice per request, and was reconnected to monitoring devices. Pt also provided with a smaller pair of skid-free socks for pts comfort and safety. Pt denied needing anything else at this time. Pts call bell still within reach.

## 2022-11-26 NOTE — Assessment & Plan Note (Signed)
Patient already knows about this and has had biopsies in the past.  TSH slightly elevated 4.992.  Continue to watch as outpatient.

## 2022-11-27 LAB — HEMOGLOBIN A1C
Hgb A1c MFr Bld: 5.7 % — ABNORMAL HIGH (ref 4.8–5.6)
Mean Plasma Glucose: 117 mg/dL

## 2023-03-21 DIAGNOSIS — I2510 Atherosclerotic heart disease of native coronary artery without angina pectoris: Secondary | ICD-10-CM | POA: Insufficient documentation

## 2023-03-21 NOTE — Progress Notes (Unsigned)
Cardiology Office Note  Date:  03/22/2023   ID:  Mauriana, Fogleman May 27, 1956, MRN [id number]  PCP:  Margorie John, MD   Chief Complaint  Patient presents with   New Patient (Initial Visit)    Ref by Alford Highland for chest pain & coronary artery calcification. Patient c/o cramping in heads, shortness of breath with little activity & chest pain with quivering. Medications reviewed by the patient verbally.     HPI:  Ms. Towana Badger is a 67 y.o.female patient with past medical history of Diabetes Chronic kidney disease stage IV on dialysis x 3 years, Mon/Wed/Fri Makes urine 3-4 a day Essential hypertension Hyperlipidemia Who presents by referral for  Dr.  Alford Highland for chest pain, aortic valve stenosis  Admitted to the hospital in June 2024 with abdominal pain, BP elevated 200 systolic  Troponin negative CT scan chest showing moderate-sized hiatal hernia, gallstones Small penetrating atherosclerotic ulcer involving proximal left subclavian artery. Coronary artery calcification, mild aortic atherosclerosis Home  Echo with ejection fraction 60% moderate aortic valve stenosis  Daughter died, amputations  Recent hospitalization discussed, She denies any significant chest pain symptoms on exertion Blood pressure currently stable on current regiment Reports she is taking amlodipine 10 mg at home, possibly from old prescription  Recent stressors, lost her spouse 1 week ago, daughter 1 year ago  She reports that she is trying to get listing for kidney transplant Was told that she needs a stress test  Lab work reviewed Total cholesterol 159 LDL 102 A1C 5 to 5.6  EKG personally reviewed by myself on todays visit EKG Interpretation Date/Time:  Monday March 22 2023 11:10:06 EDT Ventricular Rate:  74 PR Interval:  152 QRS Duration:  118 QT Interval:  434 QTC Calculation: 481 R Axis:   -25  Text Interpretation: Normal sinus rhythm Left ventricular  hypertrophy with QRS widening ( R in aVL , Cornell product ) Inferior infarct , age undetermined When compared with ECG of 25-Nov-2022 08:53, PREVIOUS ECG IS PRESENT Confirmed by Julien Nordmann [id number]) on 03/22/2023 11:12:11 AM    PMH:   has a past medical history of Diabetes mellitus without complication (HCC), Hypertension, and Renal disorder.  PSH:    Past Surgical History:  Procedure Laterality Date   av fistula Left     Current Outpatient Medications  Medication Sig Dispense Refill   amLODipine (NORVASC) 5 MG tablet Take 5 mg by mouth daily.     atorvastatin (LIPITOR) 20 MG tablet Take 1 tablet by mouth at bedtime.     b complex-vitamin c-folic acid (NEPHRO-VITE) 0.8 MG TABS tablet Take 1 tablet by mouth daily.     carvedilol (COREG) 25 MG tablet Take 25 mg by mouth 2 (two) times daily with a meal.     cloNIDine (CATAPRES) 0.2 MG tablet Take 0.2 mg by mouth 2 (two) times daily.     losartan (COZAAR) 100 MG tablet Take 100 mg by mouth daily.     glipiZIDE (GLUCOTROL XL) 2.5 MG 24 hr tablet Take 2.5 mg by mouth daily. (Patient not taking: Reported on 03/22/2023)     pantoprazole (PROTONIX) 40 MG tablet Take 1 tablet (40 mg total) by mouth daily. 90 tablet 1   No current facility-administered medications for this visit.     Allergies:   2,4-d dimethylamine; Other; Tetanus antitoxin; Tetanus toxoid; and Tetanus toxoids   Social History:  The patient  reports that she has never smoked. She has never used smokeless tobacco. She reports that she  does not drink alcohol and does not use drugs.   Family History:   family history includes Diabetes in her father; Kidney disease in her mother; Peripheral Artery Disease (age of onset: 60) in her daughter.    Review of Systems: Review of Systems  Constitutional: Negative.   HENT: Negative.    Respiratory: Negative.    Cardiovascular: Negative.   Gastrointestinal: Negative.   Musculoskeletal: Negative.   Neurological: Negative.    Psychiatric/Behavioral: Negative.    All other systems reviewed and are negative.    PHYSICAL EXAM: VS:  BP (!) 122/54 (BP Location: Right Arm, Patient Position: Sitting, Cuff Size: Normal)   Pulse 74   Ht 5\' 1"  (1.549 m)   Wt 139 lb 8 oz (63.3 kg)   SpO2 96%   BMI 26.36 kg/m  , BMI Body mass index is 26.36 kg/m. GEN: Well nourished, well developed, in no acute distress HEENT: normal Neck: no JVD, carotid bruits, or masses Cardiac: RRR; no murmurs, rubs, or gallops,no edema  Respiratory:  clear to auscultation bilaterally, normal work of breathing GI: soft, nontender, nondistended, + BS MS: no deformity or atrophy Skin: warm and dry, no rash Neuro:  Strength and sensation are intact Psych: euthymic mood, full affect   Recent Labs: 11/25/2022: ALT 15 11/26/2022: BUN 32; Creatinine, Ser 5.51; Hemoglobin 9.6; Platelets 203; Potassium 4.3; Sodium 139; TSH 4.992    Lipid Panel No results found for: "CHOL", "HDL", "LDLCALC", "TRIG"    Wt Readings from Last 3 Encounters:  03/22/23 139 lb 8 oz (63.3 kg)  11/25/22 143 lb 11.8 oz (65.2 kg)      ASSESSMENT AND PLAN:  Problem List Items Addressed This Visit       Cardiology Problems   Coronary artery calcification - Primary   Relevant Medications   atorvastatin (LIPITOR) 20 MG tablet   cloNIDine (CATAPRES) 0.2 MG tablet   Other Relevant Orders   EKG 12-Lead (Completed)   Essential hypertension   Relevant Medications   atorvastatin (LIPITOR) 20 MG tablet   cloNIDine (CATAPRES) 0.2 MG tablet   Other Relevant Orders   EKG 12-Lead (Completed)   Atherosclerotic peripheral vascular disease with ulceration (HCC)   Relevant Medications   atorvastatin (LIPITOR) 20 MG tablet   cloNIDine (CATAPRES) 0.2 MG tablet   Other Relevant Orders   EKG 12-Lead (Completed)   Aortic stenosis   Relevant Medications   atorvastatin (LIPITOR) 20 MG tablet   cloNIDine (CATAPRES) 0.2 MG tablet   Other Relevant Orders   EKG 12-Lead  (Completed)     Other   Anemia due to chronic kidney disease   Diabetes mellitus type 2, noninsulin dependent (HCC)   Relevant Medications   atorvastatin (LIPITOR) 20 MG tablet   ESRD on dialysis [facility])   Coronary calcification Noted on CT scan She is going to check with her nephrology team managing transplant listing to determine if she can do stress test locally or if it needs to be done at outside tertiary center Continue statin, will add Zetia 10 mg daily  Aortic atherosclerosis/penetrating ulcer Images pulled up and reviewed in the office today Aggressive lipid management as above Aggressive blood pressure management Was instructed by hospitalist service to follow-up with vascular  End-stage renal disease on hemodialysis Monday Wednesday Friday Reports that she continues to make urine 3-4 times per day She has restricted her fluid intake significantly, denies problems with hypotension  Essential hypertension Blood pressure is well controlled on today's visit. No changes made to the  medications.  Discussed complications from clonidine if she misses or is not on time with her doses, can have rebound hypertension  Diabetes type 2 A1c well-controlled 5.3 She reports glipizide was recently held   Total encounter time more than 60 minutes  Greater than 50% was spent in counseling and coordination of care with the patient    Signed, Dossie Arbour, M.D., Ph.D. [facility] [place], [place] [phone]

## 2023-03-22 ENCOUNTER — Ambulatory Visit: Payer: Medicare PPO | Attending: Cardiovascular Disease | Admitting: Cardiovascular Disease

## 2023-03-22 ENCOUNTER — Encounter: Payer: Self-pay | Admitting: Cardiovascular Disease

## 2023-03-22 VITALS — BP 122/54 | HR 74 | Ht 61.0 in | Wt 139.5 lb

## 2023-03-22 DIAGNOSIS — I2510 Atherosclerotic heart disease of native coronary artery without angina pectoris: Secondary | ICD-10-CM

## 2023-03-22 DIAGNOSIS — I350 Nonrheumatic aortic (valve) stenosis: Secondary | ICD-10-CM | POA: Diagnosis not present

## 2023-03-22 DIAGNOSIS — I10 Essential (primary) hypertension: Secondary | ICD-10-CM

## 2023-03-22 DIAGNOSIS — I7025 Atherosclerosis of native arteries of other extremities with ulceration: Secondary | ICD-10-CM

## 2023-03-22 DIAGNOSIS — I2584 Coronary atherosclerosis due to calcified coronary lesion: Secondary | ICD-10-CM | POA: Diagnosis not present

## 2023-03-22 DIAGNOSIS — D631 Anemia in chronic kidney disease: Secondary | ICD-10-CM

## 2023-03-22 DIAGNOSIS — E119 Type 2 diabetes mellitus without complications: Secondary | ICD-10-CM

## 2023-03-22 DIAGNOSIS — Z992 Dependence on renal dialysis: Secondary | ICD-10-CM

## 2023-03-22 DIAGNOSIS — N186 End stage renal disease: Secondary | ICD-10-CM

## 2023-03-22 DIAGNOSIS — N189 Chronic kidney disease, unspecified: Secondary | ICD-10-CM

## 2023-03-22 MED ORDER — PANTOPRAZOLE SODIUM 40 MG PO TBEC: 40.0000 mg | DELAYED_RELEASE_TABLET | Freq: Every day | ORAL | 1 refills | Status: DC

## 2023-03-22 MED ORDER — EZETIMIBE 10 MG PO TABS
10.0000 mg | ORAL_TABLET | Freq: Every day | ORAL | 3 refills | Status: DC
Start: 2023-03-22 — End: 2023-07-12

## 2023-03-22 NOTE — Patient Instructions (Addendum)
Please call nephrology transplant team and see if stress test can be done in [facility]   Medication Instructions:  Please start zetia 10 mg daily  If you need a refill on your cardiac medications before your next appointment, please call your pharmacy.   Lab work: No new labs needed  Testing/Procedures: Echo in 1 year   Your physician has requested that you have an echocardiogram. Echocardiography is a painless test that uses sound waves to create images of your heart. It provides your doctor with information about the size and shape of your heart and how well your heart's chambers and valves are working.   You may receive an ultrasound enhancing agent through an IV if needed to better visualize your heart during the echo. This procedure takes approximately one hour.  There are no restrictions for this procedure.  This will take place at [address], [place] [address]   Follow-Up: At [facility], you and your health needs are our priority.  As part of our continuing mission to provide you with exceptional heart care, we have created designated Provider Care Teams.  These Care Teams include your primary Cardiologist (physician) and Advanced Practice Providers (APPs -  Physician Assistants and Nurse Practitioners) who all work together to provide you with the care you need, when you need it.  You will need a follow up appointment in 12 months after echo  Providers on your designated Care Team:   Nicolasa Ducking, NP Eula Listen, PA-C Cadence Fransico Michael, [place]  COVID-19 Vaccine Information can be found at: [email or web address] For questions related to vaccine distribution or appointments, please email [email or web address] or call [phone].

## 2023-04-13 ENCOUNTER — Other Ambulatory Visit

## 2023-05-26 ENCOUNTER — Other Ambulatory Visit: Payer: Self-pay

## 2023-05-26 ENCOUNTER — Emergency Department
Admission: EM | Admit: 2023-05-26 | Discharge: 2023-05-26 | Disposition: A | Discharge status: Home / Self Care | Payer: Medicare PPO | Attending: Emergency Medicine | Admitting: Emergency Medicine

## 2023-05-26 ENCOUNTER — Emergency Department: Payer: Medicare PPO

## 2023-05-26 DIAGNOSIS — K219 Gastro-esophageal reflux disease without esophagitis: Secondary | ICD-10-CM | POA: Diagnosis not present

## 2023-05-26 DIAGNOSIS — E1122 Type 2 diabetes mellitus with diabetic chronic kidney disease: Secondary | ICD-10-CM | POA: Diagnosis not present

## 2023-05-26 DIAGNOSIS — Z992 Dependence on renal dialysis: Secondary | ICD-10-CM | POA: Insufficient documentation

## 2023-05-26 DIAGNOSIS — N186 End stage renal disease: Secondary | ICD-10-CM | POA: Insufficient documentation

## 2023-05-26 DIAGNOSIS — K449 Diaphragmatic hernia without obstruction or gangrene: Secondary | ICD-10-CM | POA: Diagnosis not present

## 2023-05-26 DIAGNOSIS — I120 Hypertensive chronic kidney disease with stage 5 chronic kidney disease or end stage renal disease: Secondary | ICD-10-CM | POA: Diagnosis not present

## 2023-05-26 DIAGNOSIS — R109 Unspecified abdominal pain: Secondary | ICD-10-CM | POA: Diagnosis present

## 2023-05-26 LAB — COMPREHENSIVE METABOLIC PANEL
ALT: 12 U/L (ref 0–44)
AST: 25 U/L (ref 15–41)
Albumin: 3.6 g/dL (ref 3.5–5.0)
Alkaline Phosphatase: 78 U/L (ref 38–126)
Anion gap: 12 (ref 5–15)
BUN: 26 mg/dL — ABNORMAL HIGH (ref 8–23)
CO2: 27 mmol/L (ref 22–32)
Calcium: 9.1 mg/dL (ref 8.9–10.3)
Chloride: 99 mmol/L (ref 98–111)
Creatinine, Ser: 5.03 mg/dL — ABNORMAL HIGH (ref 0.44–1.00)
GFR, Estimated: 9 mL/min — ABNORMAL LOW (ref >60)
Glucose, Bld: 170 mg/dL — ABNORMAL HIGH (ref 70–99)
Potassium: 4.1 mmol/L (ref 3.5–5.1)
Sodium: 138 mmol/L (ref 135–145)
Total Bilirubin: 0.7 mg/dL (ref <1.2)
Total Protein: 6.6 g/dL (ref 6.5–8.1)

## 2023-05-26 LAB — CBC WITH DIFFERENTIAL/PLATELET
Abs Immature Granulocytes: 0.03 10*3/uL (ref 0.00–0.07)
Basophils Absolute: 0.1 10*3/uL (ref 0.0–0.1)
Basophils Relative: 1 %
Eosinophils Absolute: 0.2 10*3/uL (ref 0.0–0.5)
Eosinophils Relative: 3 %
HCT: 34.3 % — ABNORMAL LOW (ref 36.0–46.0)
Hemoglobin: 10.5 g/dL — ABNORMAL LOW (ref 12.0–15.0)
Immature Granulocytes: 1 %
Lymphocytes Relative: 21 %
Lymphs Abs: 1.2 10*3/uL (ref 0.7–4.0)
MCH: 30.8 pg (ref 26.0–34.0)
MCHC: 30.6 g/dL (ref 30.0–36.0)
MCV: 100.6 fL — ABNORMAL HIGH (ref 80.0–100.0)
Monocytes Absolute: 0.5 10*3/uL (ref 0.1–1.0)
Monocytes Relative: 8 %
Neutro Abs: 3.7 10*3/uL (ref 1.7–7.7)
Neutrophils Relative %: 66 %
Platelets: 165 10*3/uL (ref 150–400)
RBC: 3.41 MIL/uL — ABNORMAL LOW (ref 3.87–5.11)
RDW: 14.7 % (ref 11.5–15.5)
WBC: 5.6 10*3/uL (ref 4.0–10.5)
nRBC: 0 % (ref 0.0–0.2)

## 2023-05-26 LAB — LIPASE, BLOOD: Lipase: 24 U/L (ref 11–51)

## 2023-05-26 LAB — TROPONIN I (HIGH SENSITIVITY)
Troponin I (High Sensitivity): 25 ng/L — ABNORMAL HIGH (ref <18)
Troponin I (High Sensitivity): 37 ng/L — ABNORMAL HIGH (ref <18)
Troponin I (High Sensitivity): 26 ng/L — ABNORMAL HIGH (ref <18)

## 2023-05-26 LAB — MAGNESIUM: Magnesium: 2 mg/dL (ref 1.7–2.4)

## 2023-05-26 MED ORDER — ALUM & MAG HYDROXIDE-SIMETH 200-200-20 MG/5ML PO SUSP
30.0000 mL | Freq: Once | ORAL | Status: AC
  Administered 2023-05-26: 30 mL via ORAL
  Filled 2023-05-26: qty 30

## 2023-05-26 MED ORDER — FENTANYL CITRATE PF 50 MCG/ML IJ SOSY
50.0000 ug | PREFILLED_SYRINGE | Freq: Once | INTRAMUSCULAR | Status: AC
  Administered 2023-05-26: 50 ug via INTRAVENOUS
  Filled 2023-05-26: qty 1

## 2023-05-26 MED ORDER — ONDANSETRON HCL 4 MG/2ML IJ SOLN
4.0000 mg | Freq: Once | INTRAMUSCULAR | Status: AC
  Administered 2023-05-26: 4 mg via INTRAVENOUS
  Filled 2023-05-26: qty 2

## 2023-05-26 MED ORDER — PANTOPRAZOLE SODIUM 40 MG PO TBEC: 40.0000 mg | DELAYED_RELEASE_TABLET | Freq: Every day | ORAL | 0 refills | Status: DC

## 2023-05-26 MED ORDER — PANTOPRAZOLE SODIUM 40 MG IV SOLR
40.0000 mg | Freq: Once | INTRAVENOUS | Status: AC
  Administered 2023-05-26: 40 mg via INTRAVENOUS
  Filled 2023-05-26: qty 10

## 2023-05-26 NOTE — ED Provider Notes (Addendum)
[facility] Provider Note    Event Date/Time   First MD Initiated Contact with Patient 05/26/23 [phone]     (approximate)   History   Abdominal Pain   HPI  Christy Curry is a 67 y.o. female with ESRD on hemodialysis, hypertension, diabetes who comes in with abdominal pain.  I reviewed a admission summary from 11/26/2022 where patient also had abdominal pain.  Troponins went from 24-26 patient had abdominal pain and had CT imaging that did not show any evidence of aortic aneurysm or dissection there was a small penetrating atherosclerotic ulcer.  She was started on Protonix for acid reflux, hiatal hernia.  Patient reports that she has not been taking her acid reducer because she thought she was post to just take it as needed.  She states that this pain feels pretty similar to previous and denies any shortness of breath.  She reports it is more of the same epigastric discomfort that goes up into her chest.  She received 2 hours of her dialysis.  She states that she has not taken her acid reducer in a very long time.      Physical Exam   Triage Vital Signs: ED Triage Vitals  Encounter Vitals Group     BP 05/26/23 0810 (!) 155/95     Systolic BP Percentile --      Diastolic BP Percentile --      Pulse Rate 05/26/23 0810 86     Resp 05/26/23 0810 18     Temp 05/26/23 0810 98.2 F (36.8 C)     Temp Source 05/26/23 0810 Oral     SpO2 05/26/23 0810 100 %     Weight 05/26/23 0810 136 lb 11 oz (62 kg)     Height 05/26/23 0810 5\' 1"  (1.549 m)     Head Circumference --      Peak Flow --      Pain Score 05/26/23 0809 8     Pain Loc --      Pain Education --      Exclude from Growth Chart --     Most recent vital signs: Vitals:   05/26/23 0810  BP: (!) 155/95  Pulse: 86  Resp: 18  Temp: 98.2 F (36.8 C)  SpO2: 100%     General: Awake, no distress.  CV:  Good peripheral perfusion.  Resp:  Normal effort.  Abd:  No distention.  Soft and  nontender Other:  Fistula clamp noted. Sensation intact in all extremities.equal radial pulses   ED Results / Procedures / Treatments   Labs (all labs ordered are listed, but only abnormal results are displayed) Labs Reviewed  CBC WITH DIFFERENTIAL/PLATELET - Abnormal; Notable for the following components:      Result Value   RBC 3.41 (*)    Hemoglobin 10.5 (*)    HCT 34.3 (*)    MCV 100.6 (*)    All other components within normal limits  COMPREHENSIVE METABOLIC PANEL  MAGNESIUM  LIPASE, BLOOD  TROPONIN I (HIGH SENSITIVITY)     EKG  My interpretation of EKG:  Normal sinus rate of 81 without any ST elevation, T wave version in lead III, right bundle branch block  RADIOLOGY I have reviewed the xray personally and interpreted no free air   PROCEDURES:  Critical Care performed: No  .1-3 Lead EKG Interpretation  Performed by: Concha Se, MD Authorized by: Concha Se, MD     Interpretation: normal  ECG rate:  80   ECG rate assessment: normal     Rhythm: sinus rhythm     Ectopy: none     Conduction: normal      MEDICATIONS ORDERED IN ED: Medications  pantoprazole (PROTONIX) injection 40 mg (40 mg Intravenous Given 05/26/23 0903)  alum & mag hydroxide-simeth (MAALOX/MYLANTA) 200-200-20 MG/5ML suspension 30 mL (30 mLs Oral Given 05/26/23 0903)  fentaNYL (SUBLIMAZE) injection 50 mcg (50 mcg Intravenous Given 05/26/23 0903)  ondansetron (ZOFRAN) injection 4 mg (4 mg Intravenous Given 05/26/23 0903)     IMPRESSION / MDM / ASSESSMENT AND PLAN / ED COURSE  I reviewed the triage vital signs and the nursing notes.   Patient's presentation is most consistent with acute presentation with potential threat to life or bodily function.   Patient comes in with burning sensation from her abdomen up into her chest.  Given her history of hiatal hernia and acid reflux I suspect this is most likely the cause.  Chest x-ray ordered evaluate for any free air and blood work  ordered evaluate for ACS, electrolyte abnormalities given she did not complete her dialysis session.  Her vitals are otherwise stable and given she reports this is similar to previous my suspicion for dissection is very low given she is already had contrast looking for dissection previously and that has been negative and she reports this feels very similar.  We discussed CT imaging and she wanted to hold off.  Patient did look uncomfortable so fentanyl was given   Patient was given 1 dose of IV fentanyl, Zofran, Maalox, Protonix  Patient did get a little bit desatted after the fentanyl was briefly placed on oxygen.  10:35 AM Re-evaluated patient repeat abdominal exam is soft and nontender she denies any continued pain.  We discussed CT imaging of opted to hold off on this time given reassuring workup previously and this seems more likely related to her reflux given she has had this previously.  Repeat abdominal exam remains soft and nontender we rediscussed CT imaging and have opted to hold off.  She denies any current pain.   2:55 PM  We discussed her troponins that were slightly uptrending but then downtrending again.  We offered admission to rule out her heart but patient declined.  She states that this feels exactly like when she had her acid reflux before and she has not been taking her PPI.  She would prefer to go home.  She understands to return to the ER if she develops return of chest pain for repeat evaluation.  At this time we will start her on her PPI again.  I did send a message to Dr. Mariah Milling given he is on-call so that she can get close follow-up from a cardiac perspective but at this time she is declined admission and would prefer to go home.  He stated he would have the scheduler call to make appoitnment.  Abdominal exam remains soft and nontender she has been ambulatory around the room tolerating p.o.  The patient is on the cardiac monitor to evaluate for evidence of arrhythmia and/or  significant heart rate changes.      FINAL CLINICAL IMPRESSION(S) / ED DIAGNOSES   Final diagnoses:  Gastroesophageal reflux disease without esophagitis  Hiatal hernia     Rx / DC Orders   ED Discharge Orders          Ordered    pantoprazole (PROTONIX) 40 MG tablet  Daily  05/26/23 1457             Note:  This document was prepared using Dragon voice recognition software and may include unintentional dictation errors.   Concha Se, MD 05/26/23 1503    Concha Se, MD 05/26/23 1518    Concha Se, MD 05/26/23 1610    Concha Se, MD 05/26/23 [phone]

## 2023-05-26 NOTE — Discharge Instructions (Addendum)
We discussed admission versus going home but given your chest pain is resolved and you are cardiac markers were going back down you have opted to want to go home.  We are starting you back on your acid reducer if you develop return of chest pain please return for repeat evaluation of your heart.  Get over-the-counter Maalox as well but if the symptoms are not getting better then please return

## 2023-05-26 NOTE — Progress Notes (Unsigned)
Cardiology Office Note  Date:  05/27/2023   ID:  Christy Curry, DOB 11-16-1955, MRN [id number]  PCP:  Margorie John, MD   Chief Complaint  Patient presents with   [facility] ER follow up     Patient c/o dizziness & lightheaded. Medications reviewed by the patient verbally.     HPI:  Christy Curry is a 67 y.o.female patient with past medical history of Diabetes Chronic kidney disease stage IV on dialysis x 3 years, Mon/Wed/Fri Makes urine 3-4 a day Essential hypertension Hyperlipidemia Who presents for f/u of her chest pain, aortic valve stenosis  LOV 9/24 Reports that she was at dialysis yesterday when she developed upper epigastric pressure with mild radiation into central mediastinal area Pain was not severe but reports it lasted 2 hours Started 15 minutes into dialysis  Wonders if it could be from the Benadryl which she gets before dialysis, seems to think it has happened before  EMS was called and she was taken to the emergency room given 1 dose of IV fentanyl, Zofran, Maalox, Protonix  Reports that she fell asleep from the fentanyl, woke up and pain had resolved Other workup in the ER negative, troponin negative, EKG nonacute and presents today for further evaluation  Reports that nobody called her with results of echocardiogram at [facility] or recent stress testing Echo with ejection fraction 60% moderate aortic valve stenosis  Stress test showed moderate-sized partially reversible defect apical lateral, mid inferolateral, mid anterolateral segments with concern for ischemia, ejection fraction greater than 60%, coronary calcifications noted Lateral defect new compared to 2023  Reports that she feels out of it, still dealing with the loss of her daughter 1 year ago, loss of spouse over 5 weeks ago  Statics positive today Blood pressure 142/64 supine and sitting Down to 96/52 standing up to 125/60 standing at 3 minutes Took amlodipine 10, clonidine 0.2 twice daily,  losartan 100 daily this morning  EKG performed May 26, 2023 Normal sinus rhythm no significant ST-T wave changes  Other past medical history reviewed Admitted to the hospital in June 2024 with abdominal pain, BP elevated 200 systolic  Troponin negative CT scan chest showing moderate-sized hiatal hernia, gallstones 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 Coronary artery calcification, mild aortic atherosclerosis Home  Daughter died, amputations  Lab work reviewed Total cholesterol 159 LDL 102 A1C 5 to 5.6   PMH:   has a past medical history of Diabetes mellitus without complication (HCC), Hypertension, and Renal disorder.  PSH:    Past Surgical History:  Procedure Laterality Date   av fistula Left     Current Outpatient Medications  Medication Sig Dispense Refill   amLODipine (NORVASC) 10 MG tablet Take 1 tablet (10 mg total) by mouth daily.     atorvastatin (LIPITOR) 20 MG tablet Take 1 tablet by mouth at bedtime.     b complex-vitamin c-folic acid (NEPHRO-VITE) 0.8 MG TABS tablet Take 1 tablet by mouth daily.     carvedilol (COREG) 25 MG tablet Take 25 mg by mouth 2 (two) times daily with a meal.     cloNIDine (CATAPRES) 0.2 MG tablet Take 0.2 mg by mouth 2 (two) times daily.     losartan (COZAAR) 100 MG tablet Take 100 mg by mouth daily.     ondansetron (ZOFRAN) 4 MG tablet Take 4 mg by mouth every 8 (eight) hours as needed.     pantoprazole (PROTONIX) 40 MG tablet Take 1 tablet (40 mg total) by mouth  daily for 14 days. 14 tablet 0   sevelamer carbonate (RENVELA) 0.8 g PACK packet Take 0.8 g by mouth 3 (three) times daily with meals.     ezetimibe (ZETIA) 10 MG tablet Take 1 tablet (10 mg total) by mouth daily. (Patient not taking: Reported on 05/27/2023) 90 tablet 3   glipiZIDE (GLUCOTROL XL) 2.5 MG 24 hr tablet Take 2.5 mg by mouth daily. (Patient not taking: Reported on 05/27/2023)     No current facility-administered  medications for this visit.     Allergies:   2,4-d dimethylamine; Other; Tetanus antitoxin; Tetanus toxoid; and Tetanus toxoids   Social History:  The patient  reports that she has never smoked. She has never used smokeless tobacco. She reports that she does not drink alcohol and does not use drugs.   Family History:   family history includes Diabetes in her father; Kidney disease in her mother; Peripheral Artery Disease (age of onset: 32) in her daughter.    Review of Systems: Review of Systems  Constitutional: Negative.   HENT: Negative.    Respiratory: Negative.    Cardiovascular:  Positive for chest pain.  Gastrointestinal: Negative.   Musculoskeletal: Negative.   Neurological: Negative.   Psychiatric/Behavioral: Negative.    All other systems reviewed and are negative.   PHYSICAL EXAM: VS:  BP (!) 142/64 (BP Location: Left Arm, Patient Position: Sitting, Cuff Size: Normal)   Pulse 65   Ht 5' 1.5" (1.562 m)   Wt 138 lb 8 oz (62.8 kg)   SpO2 99%   BMI 25.75 kg/m  , BMI Body mass index is 25.75 kg/m. Constitutional:  oriented to person, place, and time. No distress.  HENT:  Head: Grossly normal Eyes:  no discharge. No scleral icterus.  Neck: No JVD, no carotid bruits  Cardiovascular: Regular rate and rhythm, no murmurs appreciated Pulmonary/Chest: Clear to auscultation bilaterally, no wheezes or rails Abdominal: Soft.  no distension.  no tenderness.  Musculoskeletal: Normal range of motion Neurological:  normal muscle tone. Coordination normal. No atrophy Skin: Skin warm and dry Psychiatric: normal affect, pleasant  Recent Labs: 11/26/2022: TSH 4.992 05/26/2023: ALT 12; BUN 26; Creatinine, Ser 5.03; Hemoglobin 10.5; Magnesium 2.0; Platelets 165; Potassium 4.1; Sodium 138    Lipid Panel No results found for: "CHOL", "HDL", "LDLCALC", "TRIG"    Wt Readings from Last 3 Encounters:  05/27/23 138 lb 8 oz (62.8 kg)  05/26/23 136 lb 11 oz (62 kg)  03/22/23 139 lb 8  oz (63.3 kg)      ASSESSMENT AND PLAN:  Problem List Items Addressed This Visit       Cardiology Problems   Coronary artery calcification - Primary   Relevant Orders   EKG 12-Lead   Essential hypertension   Relevant Orders   EKG 12-Lead   Atherosclerotic peripheral vascular disease with ulceration (HCC)   Relevant Orders   EKG 12-Lead   Aortic stenosis   Relevant Orders   EKG 12-Lead     Other   Anemia due to chronic kidney disease   Diabetes mellitus type 2, noninsulin dependent (HCC)   Coronary disease with stable angina Recent episode of atypical discomfort at dialysis Records reviewed from [facility], abnormal Myoview November 2024 Known coronary calcification noted on CT scan Discussed that she may need cardiac catheterization She is noncommittal on today's visit, reports that she feels tired and does not want to schedule anything Recommend she talk with her nephrology transplant coordinator Abnormal Myoview may be a barrier to being  listed on transplant list would likely need cardiac catheterization to clarify findings Discussed procedure in detail, typically radial artery access, relatively quick recovery -If she would like to have cardiac catheterization done in [place], recommended she call our office and this could be scheduled  Aortic atherosclerosis/penetrating ulcer Plaque noted on CT scan Stressed the importance of aggressive cholesterol management Suggest she continue Lipitor 20 daily, On her last clinic visit we added Zetia 10 mg daily.  She has crossed this out, she is not taking it  End-stage renal disease on hemodialysis Monday Wednesday Friday Reports that she continues to make urine 3-4 times per day She has restricted her fluid intake significantly, denies problems with hypotension  Essential hypertension Orthostatic numbers positive today On days with dizziness in the mornings postdialysis, may need to cut her medications in half including  amlodipine, clonidine Reports she is not particular orthostatic today  Diabetes type 2 A1c well-controlled 5.3 She reports glipizide was recently held    Signed, Dossie Arbour, M.D., Ph.D. [facility] [place], [place] [phone]

## 2023-05-26 NOTE — ED Triage Notes (Signed)
Pt arrives via [facility] from dialysis where she developed abdominal pain that "feels like indigestion." Pt ran on dialysis for 2 hours before having to discontinue treatment. Pt takes a daily medication for indigestion but does not take her medication prior to dialysis. Pt denies nausea/vomiting and denies worsening pain with palpation. Pt normally takes protonix daily.

## 2023-05-26 NOTE — ED Notes (Signed)
Pt resting comfortably in bed. Daughter at bedside. Pt reports no pain.

## 2023-05-27 ENCOUNTER — Encounter: Payer: Self-pay | Admitting: Cardiovascular Disease

## 2023-05-27 ENCOUNTER — Ambulatory Visit: Payer: Medicare PPO | Attending: Cardiovascular Disease | Admitting: Cardiovascular Disease

## 2023-05-27 VITALS — BP 142/64 | HR 65 | Ht 61.5 in | Wt 138.5 lb

## 2023-05-27 DIAGNOSIS — I7025 Atherosclerosis of native arteries of other extremities with ulceration: Secondary | ICD-10-CM | POA: Diagnosis not present

## 2023-05-27 DIAGNOSIS — N189 Chronic kidney disease, unspecified: Secondary | ICD-10-CM

## 2023-05-27 DIAGNOSIS — E119 Type 2 diabetes mellitus without complications: Secondary | ICD-10-CM

## 2023-05-27 DIAGNOSIS — I2510 Atherosclerotic heart disease of native coronary artery without angina pectoris: Secondary | ICD-10-CM | POA: Diagnosis not present

## 2023-05-27 DIAGNOSIS — I10 Essential (primary) hypertension: Secondary | ICD-10-CM

## 2023-05-27 DIAGNOSIS — I350 Nonrheumatic aortic (valve) stenosis: Secondary | ICD-10-CM

## 2023-05-27 DIAGNOSIS — D631 Anemia in chronic kidney disease: Secondary | ICD-10-CM

## 2023-05-27 NOTE — Patient Instructions (Addendum)
Please call if you would like heart cardiac cath  Medication Instructions:  No changes  If you need a refill on your cardiac medications before your next appointment, please call your pharmacy.   Lab work: No new labs needed  Testing/Procedures: No new testing needed  Follow-Up: At [facility], you and your health needs are our priority.  As part of our continuing mission to provide you with exceptional heart care, we have created designated Provider Care Teams.  These Care Teams include your primary Cardiologist (physician) and Advanced Practice Providers (APPs -  Physician Assistants and Nurse Practitioners) who all work together to provide you with the care you need, when you need it.  You will need a follow up appointment in 6 months  Providers on your designated Care Team:   Nicolasa Ducking, NP Eula Listen, PA-C Cadence Fransico Michael, [place]  COVID-19 Vaccine Information can be found at: [email or web address] For questions related to vaccine distribution or appointments, please email [email or web address] or call [phone].

## 2023-07-04 ENCOUNTER — Other Ambulatory Visit: Payer: Self-pay

## 2023-07-04 ENCOUNTER — Inpatient Hospital Stay: Payer: Medicare PPO

## 2023-07-04 ENCOUNTER — Encounter: Payer: Self-pay | Admitting: Internal Medicine

## 2023-07-04 ENCOUNTER — Inpatient Hospital Stay
Admission: EM | Admit: 2023-07-04 | Discharge: 2023-07-12 | DRG: 871 | Disposition: A | Discharge status: Other Acute Inpatient Hospital | Payer: Medicare PPO | Attending: Internal Medicine | Admitting: Internal Medicine

## 2023-07-04 ENCOUNTER — Emergency Department: Payer: Medicare PPO

## 2023-07-04 DIAGNOSIS — I2583 Coronary atherosclerosis due to lipid rich plaque: Secondary | ICD-10-CM | POA: Diagnosis not present

## 2023-07-04 DIAGNOSIS — R000 Tachycardia, unspecified: Secondary | ICD-10-CM | POA: Diagnosis not present

## 2023-07-04 DIAGNOSIS — I2510 Atherosclerotic heart disease of native coronary artery without angina pectoris: Secondary | ICD-10-CM | POA: Diagnosis not present

## 2023-07-04 DIAGNOSIS — R918 Other nonspecific abnormal finding of lung field: Secondary | ICD-10-CM | POA: Diagnosis not present

## 2023-07-04 DIAGNOSIS — N186 End stage renal disease: Secondary | ICD-10-CM

## 2023-07-04 DIAGNOSIS — I480 Paroxysmal atrial fibrillation: Secondary | ICD-10-CM | POA: Diagnosis not present

## 2023-07-04 DIAGNOSIS — E1122 Type 2 diabetes mellitus with diabetic chronic kidney disease: Secondary | ICD-10-CM | POA: Diagnosis present

## 2023-07-04 DIAGNOSIS — E8721 Acute metabolic acidosis: Secondary | ICD-10-CM | POA: Diagnosis present

## 2023-07-04 DIAGNOSIS — Z7682 Awaiting organ transplant status: Secondary | ICD-10-CM

## 2023-07-04 DIAGNOSIS — I10 Essential (primary) hypertension: Secondary | ICD-10-CM | POA: Diagnosis present

## 2023-07-04 DIAGNOSIS — I214 Non-ST elevation (NSTEMI) myocardial infarction: Secondary | ICD-10-CM | POA: Diagnosis not present

## 2023-07-04 DIAGNOSIS — E042 Nontoxic multinodular goiter: Secondary | ICD-10-CM | POA: Diagnosis present

## 2023-07-04 DIAGNOSIS — T17908A Unspecified foreign body in respiratory tract, part unspecified causing other injury, initial encounter: Secondary | ICD-10-CM

## 2023-07-04 DIAGNOSIS — K219 Gastro-esophageal reflux disease without esophagitis: Secondary | ICD-10-CM | POA: Insufficient documentation

## 2023-07-04 DIAGNOSIS — I350 Nonrheumatic aortic (valve) stenosis: Secondary | ICD-10-CM | POA: Diagnosis present

## 2023-07-04 DIAGNOSIS — E785 Hyperlipidemia, unspecified: Secondary | ICD-10-CM | POA: Insufficient documentation

## 2023-07-04 DIAGNOSIS — J9602 Acute respiratory failure with hypercapnia: Secondary | ICD-10-CM | POA: Diagnosis present

## 2023-07-04 DIAGNOSIS — I700 Atherosclerosis of aorta: Secondary | ICD-10-CM | POA: Diagnosis present

## 2023-07-04 DIAGNOSIS — Z7984 Long term (current) use of oral hypoglycemic drugs: Secondary | ICD-10-CM

## 2023-07-04 DIAGNOSIS — Z841 Family history of disorders of kidney and ureter: Secondary | ICD-10-CM

## 2023-07-04 DIAGNOSIS — T82818A Embolism of vascular prosthetic devices, implants and grafts, initial encounter: Secondary | ICD-10-CM | POA: Diagnosis not present

## 2023-07-04 DIAGNOSIS — I4510 Unspecified right bundle-branch block: Secondary | ICD-10-CM | POA: Diagnosis not present

## 2023-07-04 DIAGNOSIS — J159 Unspecified bacterial pneumonia: Secondary | ICD-10-CM | POA: Diagnosis present

## 2023-07-04 DIAGNOSIS — R0603 Acute respiratory distress: Secondary | ICD-10-CM | POA: Diagnosis present

## 2023-07-04 DIAGNOSIS — D72829 Elevated white blood cell count, unspecified: Secondary | ICD-10-CM | POA: Insufficient documentation

## 2023-07-04 DIAGNOSIS — I120 Hypertensive chronic kidney disease with stage 5 chronic kidney disease or end stage renal disease: Secondary | ICD-10-CM | POA: Diagnosis present

## 2023-07-04 DIAGNOSIS — Y848 Other medical procedures as the cause of abnormal reaction of the patient, or of later complication, without mention of misadventure at the time of the procedure: Secondary | ICD-10-CM | POA: Diagnosis not present

## 2023-07-04 DIAGNOSIS — I82621 Acute embolism and thrombosis of deep veins of right upper extremity: Secondary | ICD-10-CM | POA: Diagnosis present

## 2023-07-04 DIAGNOSIS — I21A1 Myocardial infarction type 2: Secondary | ICD-10-CM | POA: Diagnosis present

## 2023-07-04 DIAGNOSIS — J9601 Acute respiratory failure with hypoxia: Secondary | ICD-10-CM

## 2023-07-04 DIAGNOSIS — N2581 Secondary hyperparathyroidism of renal origin: Secondary | ICD-10-CM | POA: Diagnosis present

## 2023-07-04 DIAGNOSIS — E119 Type 2 diabetes mellitus without complications: Secondary | ICD-10-CM

## 2023-07-04 DIAGNOSIS — A419 Sepsis, unspecified organism: Principal | ICD-10-CM

## 2023-07-04 DIAGNOSIS — R7989 Other specified abnormal findings of blood chemistry: Secondary | ICD-10-CM | POA: Diagnosis not present

## 2023-07-04 DIAGNOSIS — Y832 Surgical operation with anastomosis, bypass or graft as the cause of abnormal reaction of the patient, or of later complication, without mention of misadventure at the time of the procedure: Secondary | ICD-10-CM | POA: Diagnosis not present

## 2023-07-04 DIAGNOSIS — J121 Respiratory syncytial virus pneumonia: Secondary | ICD-10-CM

## 2023-07-04 DIAGNOSIS — E1151 Type 2 diabetes mellitus with diabetic peripheral angiopathy without gangrene: Secondary | ICD-10-CM | POA: Diagnosis present

## 2023-07-04 DIAGNOSIS — G4733 Obstructive sleep apnea (adult) (pediatric): Secondary | ICD-10-CM | POA: Diagnosis present

## 2023-07-04 DIAGNOSIS — D631 Anemia in chronic kidney disease: Secondary | ICD-10-CM | POA: Diagnosis present

## 2023-07-04 DIAGNOSIS — I340 Nonrheumatic mitral (valve) insufficiency: Secondary | ICD-10-CM | POA: Diagnosis not present

## 2023-07-04 DIAGNOSIS — R197 Diarrhea, unspecified: Secondary | ICD-10-CM

## 2023-07-04 DIAGNOSIS — G928 Other toxic encephalopathy: Secondary | ICD-10-CM | POA: Diagnosis present

## 2023-07-04 DIAGNOSIS — N289 Disorder of kidney and ureter, unspecified: Secondary | ICD-10-CM | POA: Diagnosis present

## 2023-07-04 DIAGNOSIS — Z992 Dependence on renal dialysis: Secondary | ICD-10-CM

## 2023-07-04 DIAGNOSIS — Z833 Family history of diabetes mellitus: Secondary | ICD-10-CM

## 2023-07-04 DIAGNOSIS — K5641 Fecal impaction: Secondary | ICD-10-CM | POA: Diagnosis not present

## 2023-07-04 DIAGNOSIS — Z7982 Long term (current) use of aspirin: Secondary | ICD-10-CM

## 2023-07-04 DIAGNOSIS — Z1152 Encounter for screening for COVID-19: Secondary | ICD-10-CM | POA: Diagnosis not present

## 2023-07-04 DIAGNOSIS — J189 Pneumonia, unspecified organism: Secondary | ICD-10-CM | POA: Diagnosis present

## 2023-07-04 DIAGNOSIS — G8929 Other chronic pain: Secondary | ICD-10-CM | POA: Diagnosis present

## 2023-07-04 DIAGNOSIS — R5381 Other malaise: Secondary | ICD-10-CM | POA: Diagnosis present

## 2023-07-04 DIAGNOSIS — T82848A Pain from vascular prosthetic devices, implants and grafts, initial encounter: Secondary | ICD-10-CM | POA: Diagnosis present

## 2023-07-04 DIAGNOSIS — R112 Nausea with vomiting, unspecified: Secondary | ICD-10-CM

## 2023-07-04 DIAGNOSIS — K8020 Calculus of gallbladder without cholecystitis without obstruction: Secondary | ICD-10-CM | POA: Diagnosis present

## 2023-07-04 DIAGNOSIS — I361 Nonrheumatic tricuspid (valve) insufficiency: Secondary | ICD-10-CM | POA: Diagnosis not present

## 2023-07-04 DIAGNOSIS — R0902 Hypoxemia: Secondary | ICD-10-CM

## 2023-07-04 DIAGNOSIS — R6520 Severe sepsis without septic shock: Secondary | ICD-10-CM | POA: Diagnosis present

## 2023-07-04 DIAGNOSIS — J690 Pneumonitis due to inhalation of food and vomit: Secondary | ICD-10-CM | POA: Diagnosis not present

## 2023-07-04 DIAGNOSIS — I083 Combined rheumatic disorders of mitral, aortic and tricuspid valves: Secondary | ICD-10-CM | POA: Diagnosis present

## 2023-07-04 DIAGNOSIS — E1142 Type 2 diabetes mellitus with diabetic polyneuropathy: Secondary | ICD-10-CM | POA: Diagnosis present

## 2023-07-04 DIAGNOSIS — Z79899 Other long term (current) drug therapy: Secondary | ICD-10-CM

## 2023-07-04 DIAGNOSIS — Z8249 Family history of ischemic heart disease and other diseases of the circulatory system: Secondary | ICD-10-CM

## 2023-07-04 HISTORY — DX: Hyperlipidemia, unspecified: E78.5

## 2023-07-04 HISTORY — DX: Unspecified right bundle-branch block: I45.10

## 2023-07-04 HISTORY — DX: Type 2 diabetes mellitus with unspecified diabetic retinopathy without macular edema: E11.319

## 2023-07-04 HISTORY — DX: End stage renal disease: N18.6

## 2023-07-04 HISTORY — DX: Type 2 diabetes mellitus with unspecified complications: E11.8

## 2023-07-04 HISTORY — DX: Type 2 diabetes mellitus with diabetic peripheral angiopathy without gangrene: E11.51

## 2023-07-04 HISTORY — DX: Anemia in other chronic diseases classified elsewhere: D63.8

## 2023-07-04 HISTORY — DX: Nonrheumatic aortic (valve) stenosis: I35.0

## 2023-07-04 LAB — COMPREHENSIVE METABOLIC PANEL
ALT: 13 U/L (ref 0–44)
AST: 23 U/L (ref 15–41)
Albumin: 3.9 g/dL (ref 3.5–5.0)
Alkaline Phosphatase: 81 U/L (ref 38–126)
Anion gap: 15 (ref 5–15)
BUN: 28 mg/dL — ABNORMAL HIGH (ref 8–23)
CO2: 19 mmol/L — ABNORMAL LOW (ref 22–32)
Calcium: 9.1 mg/dL (ref 8.9–10.3)
Chloride: 106 mmol/L (ref 98–111)
Creatinine, Ser: 7.07 mg/dL — ABNORMAL HIGH (ref 0.44–1.00)
GFR, Estimated: 6 mL/min — ABNORMAL LOW (ref >60)
Glucose, Bld: 171 mg/dL — ABNORMAL HIGH (ref 70–99)
Potassium: 3.8 mmol/L (ref 3.5–5.1)
Sodium: 140 mmol/L (ref 135–145)
Total Bilirubin: 0.7 mg/dL (ref 0.0–1.2)
Total Protein: 7.2 g/dL (ref 6.5–8.1)

## 2023-07-04 LAB — URINALYSIS, ROUTINE W REFLEX MICROSCOPIC
Bilirubin Urine: NEGATIVE
Glucose, UA: >=500 mg/dL — AB
Hgb urine dipstick: NEGATIVE
Ketones, ur: NEGATIVE mg/dL
Leukocytes,Ua: NEGATIVE
Nitrite: NEGATIVE
Protein, ur: >=300 mg/dL — AB
Specific Gravity, Urine: 1.012 (ref 1.005–1.030)
pH: 8 (ref 5.0–8.0)

## 2023-07-04 LAB — BLOOD GAS, ARTERIAL
Acid-Base Excess: 2.5 mmol/L — ABNORMAL HIGH (ref 0.0–2.0)
Bicarbonate: 27.9 mmol/L (ref 20.0–28.0)
Delivery systems: POSITIVE
Expiratory PAP: 6 cm[H2O]
FIO2: 60 %
Inspiratory PAP: 10 cm[H2O]
O2 Saturation: 97.2 %
Patient temperature: 37
pCO2 arterial: 45 mm[Hg] (ref 32–48)
pH, Arterial: 7.4 (ref 7.35–7.45)
pO2, Arterial: 195 mm[Hg] — ABNORMAL HIGH (ref 83–108)

## 2023-07-04 LAB — CBC WITH DIFFERENTIAL/PLATELET
Abs Immature Granulocytes: 0.15 10*3/uL — ABNORMAL HIGH (ref 0.00–0.07)
Basophils Absolute: 0.2 10*3/uL — ABNORMAL HIGH (ref 0.0–0.1)
Basophils Relative: 1 %
Eosinophils Absolute: 0.5 10*3/uL (ref 0.0–0.5)
Eosinophils Relative: 2 %
HCT: 40.5 % (ref 36.0–46.0)
Hemoglobin: 11.9 g/dL — ABNORMAL LOW (ref 12.0–15.0)
Immature Granulocytes: 1 %
Lymphocytes Relative: 6 %
Lymphs Abs: 1.4 10*3/uL (ref 0.7–4.0)
MCH: 31.4 pg (ref 26.0–34.0)
MCHC: 29.4 g/dL — ABNORMAL LOW (ref 30.0–36.0)
MCV: 106.9 fL — ABNORMAL HIGH (ref 80.0–100.0)
Monocytes Absolute: 0.9 10*3/uL (ref 0.1–1.0)
Monocytes Relative: 4 %
Neutro Abs: 18.5 10*3/uL — ABNORMAL HIGH (ref 1.7–7.7)
Neutrophils Relative %: 86 %
Platelets: 295 10*3/uL (ref 150–400)
RBC: 3.79 MIL/uL — ABNORMAL LOW (ref 3.87–5.11)
RDW: 15 % (ref 11.5–15.5)
WBC: 21.6 10*3/uL — ABNORMAL HIGH (ref 4.0–10.5)
nRBC: 0 % (ref 0.0–0.2)

## 2023-07-04 LAB — RESP PANEL BY RT-PCR (RSV, FLU A&B, COVID)  RVPGX2
Influenza A by PCR: NEGATIVE
Influenza B by PCR: NEGATIVE
Resp Syncytial Virus by PCR: POSITIVE — AB
SARS Coronavirus 2 by RT PCR: NEGATIVE

## 2023-07-04 LAB — LACTIC ACID, PLASMA: Lactic Acid, Venous: 1.7 mmol/L (ref 0.5–1.9)

## 2023-07-04 LAB — MAGNESIUM: Magnesium: 2.5 mg/dL — ABNORMAL HIGH (ref 1.7–2.4)

## 2023-07-04 LAB — APTT: aPTT: 25 s (ref 24–36)

## 2023-07-04 LAB — LIPASE, BLOOD: Lipase: 21 U/L (ref 11–51)

## 2023-07-04 LAB — CBG MONITORING, ED
Glucose-Capillary: 156 mg/dL — ABNORMAL HIGH (ref 70–99)
Glucose-Capillary: 150 mg/dL — ABNORMAL HIGH (ref 70–99)
Glucose-Capillary: 171 mg/dL — ABNORMAL HIGH (ref 70–99)

## 2023-07-04 LAB — TROPONIN I (HIGH SENSITIVITY)
Troponin I (High Sensitivity): 100 ng/L (ref <18)
Troponin I (High Sensitivity): 169 ng/L (ref <18)

## 2023-07-04 LAB — MRSA NEXT GEN BY PCR, NASAL: MRSA by PCR Next Gen: NOT DETECTED

## 2023-07-04 LAB — PROTIME-INR
INR: 1.2 (ref 0.8–1.2)
Prothrombin Time: 15.3 s — ABNORMAL HIGH (ref 11.4–15.2)

## 2023-07-04 LAB — PROCALCITONIN: Procalcitonin: 0.34 ng/mL

## 2023-07-04 MED ORDER — ATORVASTATIN CALCIUM 20 MG PO TABS
20.0000 mg | ORAL_TABLET | Freq: Every day | ORAL | Status: DC
  Filled 2023-07-04: qty 1

## 2023-07-04 MED ORDER — LOSARTAN POTASSIUM 50 MG PO TABS
100.0000 mg | ORAL_TABLET | Freq: Every day | ORAL | Status: DC
  Administered 2023-07-05: 100 mg via ORAL
  Filled 2023-07-04: qty 2

## 2023-07-04 MED ORDER — SODIUM CHLORIDE 0.9 % IV SOLN
2.0000 g | INTRAVENOUS | Status: DC
  Administered 2023-07-05 – 2023-07-06 (×2): 2 g via INTRAVENOUS
  Filled 2023-07-04 (×3): qty 20

## 2023-07-04 MED ORDER — FENTANYL BOLUS VIA INFUSION
25.0000 ug | INTRAVENOUS | Status: DC | PRN
  Administered 2023-07-05: 50 ug via INTRAVENOUS
  Administered 2023-07-05: 100 ug via INTRAVENOUS
  Administered 2023-07-05: 75 ug via INTRAVENOUS

## 2023-07-04 MED ORDER — SEVELAMER CARBONATE 0.8 G PO PACK
0.8000 g | PACK | Freq: Three times a day (TID) | ORAL | Status: DC
  Administered 2023-07-05: 0.8 g via ORAL
  Filled 2023-07-04 (×4): qty 1

## 2023-07-04 MED ORDER — FENTANYL 2500MCG IN NS 250ML (10MCG/ML) PREMIX INFUSION
25.0000 ug/h | INTRAVENOUS | Status: DC
Start: 2023-07-04 — End: 2023-07-06
  Administered 2023-07-04: 25 ug/h via INTRAVENOUS
  Administered 2023-07-06: 100 ug/h via INTRAVENOUS
  Filled 2023-07-04 (×2): qty 250

## 2023-07-04 MED ORDER — LACTATED RINGERS IV SOLN: 150.0000 mL/h | INTRAVENOUS | Status: DC

## 2023-07-04 MED ORDER — ACETAMINOPHEN 325 MG PO TABS
650.0000 mg | ORAL_TABLET | Freq: Four times a day (QID) | ORAL | Status: DC | PRN
  Administered 2023-07-04 – 2023-07-05 (×2): 650 mg via ORAL
  Filled 2023-07-04: qty 2

## 2023-07-04 MED ORDER — ONDANSETRON HCL 4 MG/2ML IJ SOLN: 4.0000 mg | Freq: Four times a day (QID) | INTRAMUSCULAR | Status: DC | PRN

## 2023-07-04 MED ORDER — DOCUSATE SODIUM 50 MG/5ML PO LIQD
100.0000 mg | Freq: Two times a day (BID) | ORAL | Status: DC
  Administered 2023-07-05 – 2023-07-06 (×3): 100 mg
  Filled 2023-07-04 (×3): qty 10

## 2023-07-04 MED ORDER — ONDANSETRON HCL 4 MG PO TABS
4.0000 mg | ORAL_TABLET | Freq: Four times a day (QID) | ORAL | Status: DC | PRN
  Administered 2023-07-04: 4 mg via ORAL
  Filled 2023-07-04: qty 1

## 2023-07-04 MED ORDER — FENTANYL CITRATE PF 50 MCG/ML IJ SOSY: 25.0000 ug | PREFILLED_SYRINGE | Freq: Once | INTRAMUSCULAR | Status: DC

## 2023-07-04 MED ORDER — ROCURONIUM BROMIDE 10 MG/ML (PF) SYRINGE
PREFILLED_SYRINGE | INTRAVENOUS | Status: AC
  Filled 2023-07-04: qty 10

## 2023-07-04 MED ORDER — NITROGLYCERIN 2 % TD OINT
1.0000 [in_us] | TOPICAL_OINTMENT | Freq: Once | TRANSDERMAL | Status: AC
  Administered 2023-07-04: 1 [in_us] via TOPICAL
  Filled 2023-07-04: qty 1

## 2023-07-04 MED ORDER — HEPARIN SODIUM (PORCINE) 5000 UNIT/ML IJ SOLN
5000.0000 [IU] | Freq: Three times a day (TID) | INTRAMUSCULAR | Status: DC
Start: 2023-07-04 — End: 2023-07-04
  Filled 2023-07-04: qty 1

## 2023-07-04 MED ORDER — SODIUM CHLORIDE 0.9 % IV SOLN
500.0000 mg | INTRAVENOUS | Status: AC
  Administered 2023-07-05 – 2023-07-06 (×2): 500 mg via INTRAVENOUS
  Filled 2023-07-04 (×2): qty 5

## 2023-07-04 MED ORDER — PROPOFOL 1000 MG/100ML IV EMUL
0.0000 ug/kg/min | INTRAVENOUS | Status: DC
  Administered 2023-07-04: 5 ug/kg/min via INTRAVENOUS
  Administered 2023-07-05 – 2023-07-06 (×4): 40 ug/kg/min via INTRAVENOUS
  Filled 2023-07-04 (×4): qty 100

## 2023-07-04 MED ORDER — POLYETHYLENE GLYCOL 3350 17 G PO PACK
17.0000 g | PACK | Freq: Every day | ORAL | Status: DC
  Administered 2023-07-05 – 2023-07-06 (×2): 17 g
  Filled 2023-07-04 (×2): qty 1

## 2023-07-04 MED ORDER — HEPARIN BOLUS VIA INFUSION
4000.0000 [IU] | INTRAVENOUS | Status: AC
  Administered 2023-07-05: 4000 [IU] via INTRAVENOUS
  Filled 2023-07-04: qty 4000

## 2023-07-04 MED ORDER — HEPARIN (PORCINE) 25000 UT/250ML-% IV SOLN
900.0000 [IU]/h | INTRAVENOUS | Status: DC
Start: 2023-07-04 — End: 2023-07-10
  Administered 2023-07-05: 950 [IU]/h via INTRAVENOUS
  Administered 2023-07-05: 1100 [IU]/h via INTRAVENOUS
  Administered 2023-07-07 – 2023-07-09 (×3): 900 [IU]/h via INTRAVENOUS
  Filled 2023-07-04 (×5): qty 250

## 2023-07-04 MED ORDER — LABETALOL HCL 5 MG/ML IV SOLN: 5.0000 mg | INTRAVENOUS | Status: DC | PRN

## 2023-07-04 MED ORDER — ETOMIDATE 2 MG/ML IV SOLN
INTRAVENOUS | Status: AC
  Filled 2023-07-04: qty 10

## 2023-07-04 MED ORDER — ACETAMINOPHEN 650 MG RE SUPP: 650.0000 mg | Freq: Four times a day (QID) | RECTAL | Status: DC | PRN

## 2023-07-04 MED ORDER — HYDRALAZINE HCL 20 MG/ML IJ SOLN: 5.0000 mg | Freq: Four times a day (QID) | INTRAMUSCULAR | Status: DC | PRN

## 2023-07-04 MED ORDER — AZITHROMYCIN 500 MG IV SOLR
500.0000 mg | Freq: Once | INTRAVENOUS | Status: AC
  Administered 2023-07-04: 500 mg via INTRAVENOUS
  Filled 2023-07-04: qty 5

## 2023-07-04 MED ORDER — INSULIN ASPART 100 UNIT/ML IJ SOLN
0.0000 [IU] | Freq: Three times a day (TID) | INTRAMUSCULAR | Status: DC
  Administered 2023-07-05: 1 [IU] via SUBCUTANEOUS
  Filled 2023-07-04: qty 1

## 2023-07-04 MED ORDER — CLONIDINE HCL 0.1 MG PO TABS
0.2000 mg | ORAL_TABLET | Freq: Two times a day (BID) | ORAL | Status: DC
  Administered 2023-07-05 (×2): 0.2 mg via ORAL
  Filled 2023-07-04 (×3): qty 2

## 2023-07-04 MED ORDER — SODIUM CHLORIDE 0.9 % IV SOLN
2.0000 g | Freq: Once | INTRAVENOUS | Status: AC
  Administered 2023-07-04: 2 g via INTRAVENOUS
  Filled 2023-07-04: qty 20

## 2023-07-04 MED ORDER — EZETIMIBE 10 MG PO TABS
10.0000 mg | ORAL_TABLET | Freq: Every day | ORAL | Status: DC
Start: 2023-07-05 — End: 2023-07-05
  Administered 2023-07-05: 10 mg via ORAL
  Filled 2023-07-04: qty 1

## 2023-07-04 MED ORDER — OXYCODONE-ACETAMINOPHEN 5-325 MG PO TABS
1.0000 | ORAL_TABLET | Freq: Four times a day (QID) | ORAL | Status: DC | PRN
Start: 2023-07-04 — End: 2023-07-05

## 2023-07-04 MED ORDER — PANTOPRAZOLE SODIUM 40 MG PO TBEC
40.0000 mg | DELAYED_RELEASE_TABLET | Freq: Every day | ORAL | Status: DC
Start: 2023-07-05 — End: 2023-07-05
  Administered 2023-07-05: 40 mg via ORAL
  Filled 2023-07-04: qty 1

## 2023-07-04 MED ORDER — CARVEDILOL 25 MG PO TABS
25.0000 mg | ORAL_TABLET | Freq: Two times a day (BID) | ORAL | Status: DC
  Administered 2023-07-04 – 2023-07-05 (×3): 25 mg via ORAL
  Filled 2023-07-04 (×3): qty 1

## 2023-07-04 MED ORDER — INSULIN ASPART 100 UNIT/ML IJ SOLN: 0.0000 [IU] | Freq: Every day | INTRAMUSCULAR | Status: DC

## 2023-07-04 NOTE — ED Notes (Signed)
 RN attempted multiple times to get IV placement.

## 2023-07-04 NOTE — Assessment & Plan Note (Addendum)
Home glipizide not resumed on admission Insulin SSI with at bedtime coverage, renal dosing ordered Goal inpatient blood glucose levels 140-180

## 2023-07-04 NOTE — ED Notes (Signed)
 Called Amy Cox DO per pt request for "breathing treatment." "I don't feel like I did earlier." Pt requested emesis bag, was provided. No PRN's available. Phone call wasn't answered

## 2023-07-04 NOTE — Assessment & Plan Note (Addendum)
 Atorvastatin 20 mg nightly, ezetimibe 10 mg daily were resumed

## 2023-07-04 NOTE — Progress Notes (Signed)
 ANTICOAGULATION CONSULT NOTE  Pharmacy Consult for heparin  infusion Indication: VTE Tx  Allergies  Allergen Reactions   2,4-D Dimethylamine Swelling   Other Swelling   Tetanus Antitoxin Itching   Tetanus Toxoid    Tetanus Toxoids Itching    Patient Measurements: Weight: 60.8 kg (134 lb) Heparin  Dosing Weight: 60.8 kg  Vital Signs: Temp: 98.5 F (36.9 C) (01/12 1344) Temp Source: Oral (01/12 1344) BP: 139/94 (01/12 2210) Pulse Rate: 91 (01/12 2210)  Labs: Recent Labs    07/04/23 1348 07/04/23 1541  HGB 11.9*  --   HCT 40.5  --   PLT 295  --   APTT  --  25  LABPROT  --  15.3*  INR  --  1.2  CREATININE 7.07*  --   TROPONINIHS 100* 169*    Estimated Creatinine Clearance: 6.5 mL/min (A) (by C-G formula based on SCr of 7.07 mg/dL (H)).   Medical History: Past Medical History:  Diagnosis Date   Diabetes mellitus without complication (HCC)    Hypertension    Renal disorder     Assessment: Pt is a 68 yo female presenting to ED c/o SOB, found with acute symptomatic nonocclusive RUExt DVT.  Goal of Therapy:  Heparin  level 0.3-0.7 units/ml Monitor platelets by anticoagulation protocol: Yes   Plan:  Bolus 4000 units x 1 Start heparin  infusion at 950 units/hr Will check HL in 8 hr after start of infusion CBC daily while on heparin   Rankin CANDIE Dills, PharmD, [facility] 07/04/2023 11:29 PM

## 2023-07-04 NOTE — ED Notes (Signed)
 Pt family member stormed into ED lobby with a large bag demanding to speak to a patient advocate and a supervisor and walked straight to door B. This RN advised the pt advocate was gone for the night. She then stated I need to see the supervisor. Family appears to be very upset. Charge RN made aware and pt went back.

## 2023-07-04 NOTE — Assessment & Plan Note (Addendum)
 Secondary to multifocal pneumonia in setting of RSV in a patient with end-stage renal disease on hemodialysis Check VBG Continue oxygen supplementation to maintain SpO2 greater than 92% Continuous pulse oximetry ordered on admission

## 2023-07-04 NOTE — Assessment & Plan Note (Addendum)
 Check MRSA by PCR due to increased risk of MRSA pneumonia If MRSA PCR is positive will initiate vancomycin per pharmacy for MRSA pneumonia

## 2023-07-04 NOTE — Progress Notes (Signed)
       CROSS COVER NOTE  NAME: Christy Curry MRN: [id number] DOB : July 01, 1955    Date of Service   07/04/2023   HPI/Events of Note   Nurse paged because patient's respiratory effort size increase with decline in her pulse oximetry.  Chart review showed patient is being treated currently for RSV and pneumonia.  Patient initially was on BiPAP but weaned off to nasal cannula.  Per care nurse patient became unresponsive prior to this NP arriving at bedside to complete assessment.  Interventions   Plan: Patient was intubated in the ED by Dr. Robinette This NP consulted ICU and patient was transferred to critical care. Family was at bedside they were updated.       Yoshito Gaza Lamin Amaiah Cristiano, MSN, APRN, AGACNP-BC [facility] [place] Pager: [phone]. Check Amion for Availability

## 2023-07-04 NOTE — Progress Notes (Signed)
 CODE SEPSIS - PHARMACY COMMUNICATION  **Broad Spectrum Antibiotics should be administered within 1 hour of Sepsis diagnosis**  Time Code Sepsis Called/Page Received: 1527  Antibiotics Ordered: ceftriaxone , azithromycin   Time of 1st antibiotic administration: 1522  Additional action taken by pharmacy: N/A  If necessary, Name of Provider/Nurse Contacted: N/A    Lum VEAR Mania ,PharmD Clinical Pharmacist  07/04/2023  3:28 PM

## 2023-07-04 NOTE — ED Notes (Signed)
 Received call from [facility] transfer [facility]:[id number] per Admin they are declining patient due to capacity @1913 

## 2023-07-04 NOTE — ED Provider Notes (Signed)
-----------------------------------------   11:21 PM on 07/04/2023 -----------------------------------------   Called into the room of this admitted patient for respiratory distress. Patient hypoxic on BiPAP, minimally responsive.  Quick confirmation with daughter who is at bedside that patient is full code.  She was intubated for airway protection with etomidate  and rocuronium  as she is a dialysis patient.  Hospitalist midlevel and CCU NP at bedside to assume care of patient after intubation.  Procedure Name: Intubation Date/Time: 07/05/2023 12:21 AM  Performed by: Robinette Vermell PARAS, MDPre-anesthesia Checklist: Patient identified, Emergency Drugs available, Suction available and Patient being monitored Oxygen Delivery Method: Ambu bag Induction Type: Rapid sequence Ventilation: Mask ventilation without difficulty Laryngoscope Size: Glidescope and 3 Number of attempts: 1 Airway Equipment and Method: Rigid stylet Placement Confirmation: ETT inserted through vocal cords under direct vision, Positive ETCO2, CO2 detector and Breath sounds checked- equal and bilateral Dental Injury: Teeth and Oropharynx as per pre-operative assessment         Robinette Vermell PARAS, MD 07/05/23 [phone]

## 2023-07-04 NOTE — H&P (Addendum)
 Addendum (17:03): per RN message, patient has been trialed off BIPAP for approximately and she is tolerating nasal cannula.  I presented to bedside, patient is sitting up and appears improved. She states she feels so much better. She is not ready to have full diet yet, just would like ginger ale. She reports the IV on the right arm is hurting her.  - BiPAP discontinued - Diet: NPO changed to clear liquid with order: advance as tolerated to full liquid diet - Stepdown changed to telemetry cardiac - Continue 4 L [place] and continuous pulse oximetry   History and Physical   Brie Eppard FMW:[id number] DOB: 1956-01-03 DOA: 07/04/2023  PCP: Gwenyth Barnacle, MD  Outpatient Specialists: Dr. Oral, [facility] Patient coming from: home via EMS  I have personally briefly reviewed patient's old medical records in [facility] EMR.  Chief Concern: shortness of breath  HPI: Mr. 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  Vitals in the ED showed temperature of 98.5, respiration rate 22, heart rate of 111, blood pressure 189/73, SpO2 100% on BiPAP.  Serum sodium is 140, potassium 3.8, chloride 106, bicarb 16, BUN of 27, serum creatinine 707, EGFR 6, nonfasting blood glucose 171, WBC 21.6, hemoglobin 11.9, platelets of 295.  Magnesium level is 2.5.  LFTs were within normal limits.  Lipase is 21.  High sensitivity troponin is 100.  ED treatment: Azithromycin  500 mg IV one-time dose, ceftriaxone  2 g IV one-time dose, nitroglycerin  1 inch. ---------------------------------- At bedside, patient was able to tell me her first and last name, age, location, current calendar year.  Patient is currently on BiPAP therapy.     She states that she feels better than this morning.  She reports that she was up and watching TV at home when she developed shortness of breath.  She denies known sick contacts.  She reports that on Thursday she had several episodes, too many to count of initially loose bowel movements and then diarrhea.  The diarrhea resolved on Friday and Saturday.  Today, she vomited 2-3 times and it was the soup that she had eaten earlier.  There is no report of coffee-ground emesis or blood.  She endorses generalized weakness this morning.  She denies trauma to her person.  She denies chest pain, abdominal pain, dysuria, hematuria, diarrhea, syncope, swelling of her lower extremities.  Social history: She currently lives at home on her own.  She is a widow, her husband of 40+ years passed away in 03-07-23.  Patient denies history of tobacco, EtOH, recreational drug use.  ROS: Constitutional: no weight change, no fever ENT/Mouth: no sore throat, no rhinorrhea Eyes: no eye pain, no vision changes Cardiovascular: no chest pain, + dyspnea,  no edema, no palpitations Respiratory: no cough, no sputum, no wheezing Gastrointestinal: + nausea, + vomiting, + diarrhea, no constipation Genitourinary: no urinary incontinence, no dysuria, no hematuria Musculoskeletal: no arthralgias, no myalgias Skin: no skin lesions, no pruritus, Neuro: + weakness, no loss of consciousness, no syncope Psych: no anxiety, no depression, + decrease appetite Heme/Lymph: no bruising, no bleeding  ED Course: Discussed with the EDP, patient requiring hospitalization for chief concerns of acute hypoxic respiratory failure  Assessment/Plan  Principal Problem:   Multilobar lung infiltrate Active Problems:   ESRD on dialysis (  HCC)   Elevated troponin   Acute hypoxic respiratory failure (HCC)   RSV (respiratory syncytial virus pneumonia)   Nausea vomiting and diarrhea   Essential hypertension   Diabetes mellitus type 2,  noninsulin dependent (HCC)   Multiple thyroid  nodules   Aortic stenosis   Leukocytosis   Hyperlipidemia   GERD (gastroesophageal reflux disease)   Assessment and Plan:  * Multilobar lung infiltrate Check MRSA PCR, procalcitonin on admission Recheck procalcitonin in the a.m. to ensure appropriate therapy Continue with azithromycin  500 mg IV daily, ceftriaxone  2 g IV daily to complete 5-day course Will check lactic acid x 2, pending collection Blood cultures x 2 are ordered and pending collection at this time in process UA has been ordered and pending collection  Acute hypoxic respiratory failure (HCC) Secondary to multifocal pneumonia in setting of RSV in a patient with end-stage renal disease on hemodialysis Check VBG Continue oxygen supplementation to maintain SpO2 greater than 92% Continuous pulse oximetry ordered on admission  Elevated troponin Suspect secondary to demand ischemia in setting of end-stage renal disease on hemodialysis with new multifocal pneumonia and RSV infection Initial high sensitive troponin was 100 and second HS was 169. At bedside, patient denies chest pain and states her shortness of breath has improved significantly compared to when she was at home. No indication for heparin  per pharmacy at this time Recheck Trops in the AM once she has gotten sufficient treatment with bipap therapy and antibiotics  ESRD on dialysis [facility]) Nephrology has been consulted  Nausea vomiting and diarrhea Query gastroenteritis secondary to viral infection Lactated ringer 150 mL/h, 20 hrs ordered on admission Strict I's and O's  RSV (respiratory syncytial virus pneumonia) Check MRSA by PCR due to increased risk of MRSA pneumonia If MRSA PCR is positive will initiate vancomycin per pharmacy for MRSA pneumonia  Diabetes mellitus type 2, noninsulin dependent (HCC) Home glipizide not resumed on admission Insulin  SSI with at bedtime coverage, renal dosing ordered Goal  inpatient blood glucose levels 140-180  Essential hypertension Uncontrolled Home carvedilol  25 mg p.o. twice daily with meals, clonidine  0.25 mg p.o. twice daily, losartan  100 mg daily were resumed on admission Hydralazine  5 mg IV every 6 hours as needed for SBP greater than 165, 5 days ordered; labetalol  5 mg IV every 3 hours as needed for SBP greater 165, not responsive to IV hydralazine , 4 doses ordered  GERD (gastroesophageal reflux disease) Home PPI resumed  Hyperlipidemia Atorvastatin  20 mg nightly, ezetimibe  10 mg daily were resumed  Leukocytosis reCheck CBC in the a.m.  Chart reviewed.   DVT prophylaxis: Heparin  5000 units subcutaneous every 8 hours Code Status: Full code Diet: n.P.o. as patient is on BiPAP Family Communication: Updated daughter, Rosina at bedside with patient's permission Disposition Plan: Pending clinical course, guarded prognosis Consults called: Nephrology Admission status: Inpatient, stepdown  Past Medical History:  Diagnosis Date   Diabetes mellitus without complication (HCC)    Hypertension    Renal disorder    Past Surgical History:  Procedure Laterality Date   av fistula Left    Social History:  reports that she has never smoked. She has never used smokeless tobacco. She reports that she does not drink alcohol and does not use drugs.  Allergies  Allergen Reactions   2,4-D Dimethylamine Swelling   Other Swelling   Tetanus Antitoxin Itching   Tetanus Toxoid    Tetanus Toxoids Itching   Family History  Problem Relation Age of Onset   Kidney disease Mother  Diabetes Father    Peripheral Artery Disease Daughter 42       bilateral amputation   Family history: Family history reviewed and not pertinent.  Prior to Admission medications   Medication Sig Start Date End Date Taking? Authorizing Provider  cloNIDine  (CATAPRES ) 0.1 MG tablet Take 1 tablet by mouth 2 (two) times daily. 03/27/23  Yes [provider]  amLODipine   (NORVASC ) 10 MG tablet Take 1 tablet (10 mg total) by mouth daily. 03/22/23   Gollan, Timothy J, MD  atorvastatin  (LIPITOR ) 20 MG tablet Take 1 tablet by mouth at bedtime. 01/05/23   [provider]  b complex-vitamin c-folic acid  (NEPHRO-VITE) 0.8 MG TABS tablet Take 1 tablet by mouth daily. 09/02/22   [provider]  carvedilol  (COREG ) 25 MG tablet Take 25 mg by mouth 2 (two) times daily with a meal. 03/29/19   [provider]  cloNIDine  (CATAPRES ) 0.2 MG tablet Take 0.2 mg by mouth 2 (two) times daily. 03/03/23   [provider]  ezetimibe  (ZETIA ) 10 MG tablet Take 1 tablet (10 mg total) by mouth daily. Patient not taking: Reported on 05/27/2023 03/22/23   Gollan, Timothy J, MD  glipiZIDE (GLUCOTROL XL) 2.5 MG 24 hr tablet Take 2.5 mg by mouth daily. Patient not taking: Reported on 05/27/2023 07/27/22   [provider]  losartan  (COZAAR ) 100 MG tablet Take 100 mg by mouth daily.    [provider]  ondansetron  (ZOFRAN ) 4 MG tablet Take 4 mg by mouth every 8 (eight) hours as needed. 05/14/23   [provider]  pantoprazole  (PROTONIX ) 40 MG tablet Take 1 tablet (40 mg total) by mouth daily for 14 days. 05/26/23 06/09/23  Ernest Ronal BRAVO, MD  sevelamer  carbonate (RENVELA ) 0.8 g PACK packet Take 0.8 g by mouth 3 (three) times daily with meals. 05/24/23   [provider]   Physical Exam: Vitals:   07/04/23 1344 07/04/23 1350 07/04/23 1405 07/04/23 1635  BP:  (!) 189/73    Pulse: (!) 111  (!) 110 99  Resp: (!) 22  (!) 28 18  Temp: 98.5 F (36.9 C)     TempSrc: Oral     SpO2: 100%  100% 100%  Weight:  60.8 kg     Constitutional: appears age-appropriate, frail, BiPAP therapy in place Eyes: PERRL, lids and conjunctivae normal ENMT: Mucous membranes are moist. Posterior pharynx clear of any exudate or lesions. Age-appropriate dentition. Hearing appropriate Neck: normal, supple, no masses, no thyromegaly Respiratory: Bilevel positive  airway pressure lung sounds auscultated, no wheezing, no crackles.  Increased respiratory effort.  Increased accessory muscle use.  Cardiovascular: Tachycardic, regular rhythm, no murmurs / rubs / gallops. No extremity edema. 2+ pedal pulses. No carotid bruits.  Abdomen: Obese abdomen, no tenderness, no masses palpated, no hepatosplenomegaly. Bowel sounds positive.  Musculoskeletal: no clubbing / cyanosis. No joint deformity upper and lower extremities. Good ROM, no contractures, no atrophy. Normal muscle tone.  Skin: no rashes, lesions, ulcers. No induration Neurologic: Sensation intact. Strength 5/5 in all 4.  Psychiatric: Normal judgment and insight. Alert and oriented x 3. Normal mood.   EKG: independently reviewed, showing sinus tachycardia with rate of 110, QTc 412, right bundle branch block  Chest x-ray on Admission: I personally reviewed and I agree with radiologist reading as below.  DG Chest Portable 1 View Result Date: 07/04/2023 CLINICAL DATA:  Shortness of breath. EXAM: PORTABLE CHEST 1 VIEW COMPARISON:  Chest x-ray dated May 26, 2023. FINDINGS: Borderline cardiomegaly. Asymmetrically increased  interstitial thickening in the right lung. Hazy airspace disease in the right mid lung and left lower lung. Suspected small bilateral pleural effusions. No pneumothorax. No acute osseous abnormality. Left axillary stents noted. IMPRESSION: 1. Asymmetric interstitial thickening in the right lung with hazy airspace disease in the right mid lung and left lower lung, concerning for moderate pulmonary edema versus multifocal pneumonia. 2. Suspected small bilateral pleural effusions. Electronically Signed   By: Elsie ONEIDA Shoulder M.D.   On: 07/04/2023 14:09   Labs on Admission: I have personally reviewed following labs  CBC: Recent Labs  Lab 07/04/23 1348  WBC 21.6*  NEUTROABS 18.5*  HGB 11.9*  HCT 40.5  MCV 106.9*  PLT 295   Basic Metabolic Panel: Recent Labs  Lab 07/04/23 1348  NA 140   K 3.8  CL 106  CO2 19*  GLUCOSE 171*  BUN 28*  CREATININE 7.07*  CALCIUM  9.1  MG 2.5*   GFR: Estimated Creatinine Clearance: 6.5 mL/min (A) (by C-G formula based on SCr of 7.07 mg/dL (H)).  Liver Function Tests: Recent Labs  Lab 07/04/23 1348  AST 23  ALT 13  ALKPHOS 81  BILITOT 0.7  PROT 7.2  ALBUMIN 3.9   Recent Labs  Lab 07/04/23 1348  LIPASE 21   CRITICAL CARE Performed by: Dr. Sherre  Total critical care time: 32 minutes  Critical care time was exclusive of separately billable procedures and treating other patients.  Critical care was necessary to treat or prevent imminent or life-threatening deterioration.  Critical care was time spent personally by me on the following activities: development of treatment plan with patient and/or surrogate as well as nursing, discussions with consultants, evaluation of patient's response to treatment, examination of patient, obtaining history from patient or surrogate, ordering and performing treatments and interventions, ordering and review of laboratory studies, ordering and review of radiographic studies, pulse oximetry and re-evaluation of patient's condition.  This document was prepared using Dragon Voice Recognition software and may include unintentional dictation errors.  Dr. Sherre [facility]  If 7PM-7AM, please contact overnight-coverage provider If 7AM-7PM, please contact day attending provider [email or web address]  07/04/2023, 5:39 PM

## 2023-07-04 NOTE — ED Notes (Signed)
 Attempted to call Dr. Sedalia Muta per family request

## 2023-07-04 NOTE — Assessment & Plan Note (Signed)
 Query gastroenteritis secondary to viral infection Lactated ringer 150 mL/h, 20 hrs ordered on admission Strict I's and O's

## 2023-07-04 NOTE — Assessment & Plan Note (Addendum)
 Suspect secondary to demand ischemia in setting of end-stage renal disease on hemodialysis with new multifocal pneumonia and RSV infection Initial high sensitive troponin was 100 and second HS was 169. At bedside, patient denies chest pain and states her shortness of breath has improved significantly compared to when she was at home. No indication for heparin  per pharmacy at this time Recheck Trops in the AM once she has gotten sufficient treatment with bipap therapy and antibiotics

## 2023-07-04 NOTE — ED Provider Notes (Signed)
 [facility] Provider Note    Event Date/Time   First MD Initiated Contact with Patient 07/04/23 1347     (approximate)   History   Respiratory Distress   HPI  Christy Curry is a 68 y.o. female who presents to the ED for evaluation of Respiratory Distress   Review of cardiology clinic visit from 12/5.  Hemodialysis patient MWF, history of aortic valve stenosis, HTN, HLD, normal EF.  Gallstones.  Patient presents from home via EMS for evaluation of weakness, shortness of breath, cough.  She has been compliant with her dialysis this week and has not missed any sessions, including Friday.   Physical Exam   Triage Vital Signs: ED Triage Vitals  Encounter Vitals Group     BP      Systolic BP Percentile      Diastolic BP Percentile      Pulse      Resp      Temp      Temp src      SpO2      Weight      Height      Head Circumference      Peak Flow      Pain Score      Pain Loc      Pain Education      Exclude from Growth Chart     Most recent vital signs: Vitals:   07/04/23 1350 07/04/23 1405  BP: (!) 189/73   Pulse:  (!) 110  Resp:  (!) 28  Temp:    SpO2:  100%    General: Awake, looks uncomfortable.  Tripoding CV:  Good peripheral perfusion.  Tachycardic Resp:  Tachypneic and tripoding. Abd:  No distention.  Soft MSK:  No deformity noted.  Neuro:  No focal deficits appreciated. Other:     ED Results / Procedures / Treatments   Labs (all labs ordered are listed, but only abnormal results are displayed) Labs Reviewed  RESP PANEL BY RT-PCR (RSV, FLU A&B, COVID)  RVPGX2 - Abnormal; Notable for the following components:      Result Value   Resp Syncytial Virus by PCR POSITIVE (*)    All other components within normal limits  COMPREHENSIVE METABOLIC PANEL - Abnormal; Notable for the following components:   CO2 19 (*)    Glucose, Bld 171 (*)    BUN 28 (*)    Creatinine, Ser 7.07 (*)    GFR, Estimated 6 (*)    All  other components within normal limits  CBC WITH DIFFERENTIAL/PLATELET - Abnormal; Notable for the following components:   WBC 21.6 (*)    RBC 3.79 (*)    Hemoglobin 11.9 (*)    MCV 106.9 (*)    MCHC 29.4 (*)    Neutro Abs 18.5 (*)    Basophils Absolute 0.2 (*)    Abs Immature Granulocytes 0.15 (*)    All other components within normal limits  MAGNESIUM - Abnormal; Notable for the following components:   Magnesium 2.5 (*)    All other components within normal limits  TROPONIN I (HIGH SENSITIVITY) - Abnormal; Notable for the following components:   Troponin I (High Sensitivity) 100 (*)    All other components within normal limits  CULTURE, BLOOD (ROUTINE X 2)  CULTURE, BLOOD (ROUTINE X 2)  LIPASE, BLOOD  URINALYSIS, ROUTINE W REFLEX MICROSCOPIC  LACTIC ACID, PLASMA  LACTIC ACID, PLASMA  PROTIME-INR  APTT    EKG Poor quality EKG  seems to demonstrate a sinus tachycardia with rate of 110 bpm.  Right bundle.  No STEMI.  RADIOLOGY 1 view CXR interpreted by me with right sided infiltrates concerning for pneumonia.  Official radiology report(s): DG Chest Portable 1 View Result Date: 07/04/2023 CLINICAL DATA:  Shortness of breath. EXAM: PORTABLE CHEST 1 VIEW COMPARISON:  Chest x-ray dated May 26, 2023. FINDINGS: Borderline cardiomegaly. Asymmetrically increased interstitial thickening in the right lung. Hazy airspace disease in the right mid lung and left lower lung. Suspected small bilateral pleural effusions. No pneumothorax. No acute osseous abnormality. Left axillary stents noted. IMPRESSION: 1. Asymmetric interstitial thickening in the right lung with hazy airspace disease in the right mid lung and left lower lung, concerning for moderate pulmonary edema versus multifocal pneumonia. 2. Suspected small bilateral pleural effusions. Electronically Signed   By: Elsie ONEIDA Shoulder M.D.   On: 07/04/2023 14:09    PROCEDURES and INTERVENTIONS:  .1-3 Lead EKG Interpretation  Performed  by: Claudene Rover, MD Authorized by: Claudene Rover, MD     Interpretation: abnormal     ECG rate:  110   ECG rate assessment: tachycardic     Rhythm: sinus tachycardia     Ectopy: none     Conduction: normal   .Critical Care  Performed by: Claudene Rover, MD Authorized by: Claudene Rover, MD   Critical care provider statement:    Critical care time (minutes):  30   Critical care time was exclusive of:  Separately billable procedures and treating other patients   Critical care was necessary to treat or prevent imminent or life-threatening deterioration of the following conditions:  Respiratory failure and sepsis   Critical care was time spent personally by me on the following activities:  Development of treatment plan with patient or surrogate, discussions with consultants, evaluation of patient's response to treatment, examination of patient, ordering and review of laboratory studies, ordering and review of radiographic studies, ordering and performing treatments and interventions, pulse oximetry, re-evaluation of patient's condition and review of old charts .Ultrasound ED Peripheral IV (Provider)  Date/Time: 07/04/2023 3:11 PM  Performed by: Claudene Rover, MD Authorized by: Claudene Rover, MD   Procedure details:    Indications: multiple failed IV attempts and poor IV access     Skin Prep: chlorhexidine  gluconate     Location: Right cephalic vein.   Angiocath:  20 G   Bedside Ultrasound Guided: Yes     Images: not archived     Patient tolerated procedure without complications: Yes     Dressing applied: Yes     Medications  cefTRIAXone  (ROCEPHIN ) 2 g in sodium chloride  0.9 % 100 mL IVPB (has no administration in time range)  azithromycin  (ZITHROMAX ) 500 mg in sodium chloride  0.9 % 250 mL IVPB (has no administration in time range)  nitroGLYCERIN  (NITROGLYN) 2 % ointment 1 inch (1 inch Topical Given 07/04/23 1358)     IMPRESSION / MDM / ASSESSMENT AND PLAN / ED COURSE  I reviewed the  triage vital signs and the nursing notes.  Differential diagnosis includes, but is not limited to, ACS, PTX, PNA, muscle strain/spasm, PE, dissection, anxiety, pleural effusion  {Patient presents with symptoms of an acute illness or injury that is potentially life-threatening.  Patient presents short of breath and weak with evidence of hypoxic respiratory failure due to pneumonia and sepsis.  Rapidly improving clinical picture with BiPAP at [place].  Leukocytosis, tachypnea and tachycardia meet sepsis criteria alongside infiltrates on x-ray.  Testing positive for RSV as well.  Troponin is moderately high and we will trend this.  Consult with medicine for admission  Clinical Course as of 07/04/23 1516  Sun Jul 04, 2023  1426 Reassessed.  Looking better on the BiPAP.  Discussed x-ray results my concern for pneumonia. [DS]  1459 USIV placed by me, right cephalic v [DS]    Clinical Course User Index [DS] Claudene Rover, MD     FINAL CLINICAL IMPRESSION(S) / ED DIAGNOSES   Final diagnoses:  Sepsis due to pneumonia [facility])  Hypoxia  Respiratory distress     Rx / DC Orders   ED Discharge Orders     None        Note:  This document was prepared using Dragon voice recognition software and may include unintentional dictation errors.   Claudene Rover, MD 07/04/23 [phone]

## 2023-07-04 NOTE — Assessment & Plan Note (Signed)
 Check MRSA PCR, procalcitonin on admission Recheck procalcitonin in the a.m. to ensure appropriate therapy Continue with azithromycin  500 mg IV daily, ceftriaxone  2 g IV daily to complete 5-day course Will check lactic acid x 2, pending collection Blood cultures x 2 are ordered and pending collection at this time in process UA has been ordered and pending collection

## 2023-07-04 NOTE — Assessment & Plan Note (Signed)
-   Nephrology has been consulted

## 2023-07-04 NOTE — ED Triage Notes (Signed)
 Pt arrived via EMS from home. EMS sts that per FD pt had low o2 sats and they applied 15l/min via NRB. EMS sts that they attempted to take pt off of NRB buy was not able to. Pt is a dialysis pt and goes M,W,F. Pt sts that she had a full treatment on Friday

## 2023-07-04 NOTE — ED Notes (Signed)
 Phlebotomy at bedside.

## 2023-07-04 NOTE — Assessment & Plan Note (Signed)
 Home PPI resumed

## 2023-07-04 NOTE — Hospital Course (Signed)
 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  Vitals in the ED showed temperature of 98.5, respiration rate 22, heart rate of 111, blood pressure 189/73, SpO2 100% on BiPAP.  Serum sodium is 140, potassium 3.8, chloride 106, bicarb 16, BUN of 27, serum creatinine 707, EGFR 6, nonfasting blood glucose 171, WBC 21.6, hemoglobin 11.9, platelets of 295.  Magnesium level is 2.5.  LFTs were within normal limits.  Lipase is 21.  High sensitivity troponin is 100.  ED treatment: Azithromycin  500 mg IV one-time dose, ceftriaxone  2 g IV one-time dose, nitroglycerin  1 inch.

## 2023-07-04 NOTE — ED Notes (Signed)
 Pt notified RN that should would like to be transferred to [facility] due to "the lack of care that she is getting at this hospital." RN notified provider of pt wishes.

## 2023-07-04 NOTE — Assessment & Plan Note (Addendum)
 Uncontrolled Home carvedilol  25 mg p.o. twice daily with meals, clonidine  0.25 mg p.o. twice daily, losartan  100 mg daily were resumed on admission Hydralazine  5 mg IV every 6 hours as needed for SBP greater than 165, 5 days ordered; labetalol  5 mg IV every 3 hours as needed for SBP greater 165, not responsive to IV hydralazine , 4 doses ordered

## 2023-07-04 NOTE — ED Notes (Signed)
 Pt requested Ice pack for her upper right arm. Pt sts that she would like pain meds also for the discomfort. RN to notify MD.

## 2023-07-04 NOTE — Progress Notes (Addendum)
 I was informed by RN that patient's daughter would like to be transferred to [facility] because she is on transplant list and also because daughter does not feel like she is receiving appropriate care here.   Daughter states that her mother did not get enough pillows and that she has for pain management for her mother's arm and this has not been.  Daughter is also requesting an ice pack for the right arm.   I discussed with RN please use acetaminophen  650 mg PO/rectal as needed as ordered.  # Right arm pain at IV site, secondary to IV infiltration  - IV team attempted twice, RN attempted and EDP attempted - PCCM has been consulted for IV access - Percocet 5-325 mg PO q6h prn for moderate pain, 2 doses ordered  # Patient initiated transfer - [facility] was called and states their medical director has closed to all medical transfer from outside facility - [facility] attempted and at the time of this dictation, I am pending callback from [facility] medical director - Per ED secretary, [facility] has declined patient transfer due to capacity. I attempted to call Ms. Laymon Daring at [phone], no pick and voicemail greeting did not indicate a name, therefore I could not leave a message due to HIPAA protection. I attempted to call [phone] in the chart; voicemail did not indicate a name so I could not leave a message due to HIPAA protection.  Addendum: Was able to speak with daughter Rosina Daring and I informed her that both [facility] and [facility] has declined the transfer request due to the capacity.  Dr. Sherre

## 2023-07-04 NOTE — ED Notes (Signed)
 Pt sts that IV is painful. RN assessed the IV site and was able to feel small infiltration. RN to place order for IV team.

## 2023-07-04 NOTE — ED Notes (Signed)
 Pt taken off Bi pap and is able to talk in full sentences. Pt breathing is no longer labored.

## 2023-07-04 NOTE — Assessment & Plan Note (Signed)
 reCheck CBC in the a.m.

## 2023-07-05 ENCOUNTER — Encounter: Payer: Self-pay | Admitting: Internal Medicine

## 2023-07-05 ENCOUNTER — Inpatient Hospital Stay
Admit: 2023-07-05 | Discharge: 2023-07-05 | Disposition: A | Discharge status: Home / Self Care | Payer: Medicare PPO | Attending: Nurse Practitioner | Admitting: Nurse Practitioner

## 2023-07-05 ENCOUNTER — Inpatient Hospital Stay: Admit: 2023-07-05 | Payer: Medicare PPO

## 2023-07-05 ENCOUNTER — Inpatient Hospital Stay: Payer: Medicare PPO

## 2023-07-05 ENCOUNTER — Other Ambulatory Visit: Payer: Self-pay

## 2023-07-05 DIAGNOSIS — R918 Other nonspecific abnormal finding of lung field: Secondary | ICD-10-CM | POA: Diagnosis not present

## 2023-07-05 DIAGNOSIS — J9601 Acute respiratory failure with hypoxia: Secondary | ICD-10-CM | POA: Diagnosis not present

## 2023-07-05 LAB — CBC
HCT: 31.6 % — ABNORMAL LOW (ref 36.0–46.0)
Hemoglobin: 10 g/dL — ABNORMAL LOW (ref 12.0–15.0)
MCH: 30.9 pg (ref 26.0–34.0)
MCHC: 31.6 g/dL (ref 30.0–36.0)
MCV: 97.5 fL (ref 80.0–100.0)
Platelets: 264 10*3/uL (ref 150–400)
RBC: 3.24 MIL/uL — ABNORMAL LOW (ref 3.87–5.11)
RDW: 14.7 % (ref 11.5–15.5)
WBC: 13.5 10*3/uL — ABNORMAL HIGH (ref 4.0–10.5)
nRBC: 0 % (ref 0.0–0.2)

## 2023-07-05 LAB — HEPATITIS B SURFACE ANTIGEN
Hepatitis B Surface Ag: NONREACTIVE
Hepatitis B Surface Ag: NONREACTIVE

## 2023-07-05 LAB — BASIC METABOLIC PANEL
Anion gap: 17 — ABNORMAL HIGH (ref 5–15)
BUN: 35 mg/dL — ABNORMAL HIGH (ref 8–23)
CO2: 23 mmol/L (ref 22–32)
Calcium: 8.8 mg/dL — ABNORMAL LOW (ref 8.9–10.3)
Chloride: 104 mmol/L (ref 98–111)
Creatinine, Ser: 8.01 mg/dL — ABNORMAL HIGH (ref 0.44–1.00)
GFR, Estimated: 5 mL/min — ABNORMAL LOW (ref >60)
Glucose, Bld: 188 mg/dL — ABNORMAL HIGH (ref 70–99)
Potassium: 3.7 mmol/L (ref 3.5–5.1)
Sodium: 144 mmol/L (ref 135–145)

## 2023-07-05 LAB — BLOOD GAS, ARTERIAL
Acid-base deficit: 0.5 mmol/L (ref 0.0–2.0)
Bicarbonate: 26.3 mmol/L (ref 20.0–28.0)
FIO2: 100 %
MECHVT: 370 mL
Mechanical Rate: 18
O2 Saturation: 97.6 %
PEEP: 5 cmH2O
Patient temperature: 37
pCO2 arterial: 51 mm[Hg] — ABNORMAL HIGH (ref 32–48)
pH, Arterial: 7.32 — ABNORMAL LOW (ref 7.35–7.45)
pO2, Arterial: 152 mm[Hg] — ABNORMAL HIGH (ref 83–108)

## 2023-07-05 LAB — TROPONIN I (HIGH SENSITIVITY)
Troponin I (High Sensitivity): 795 ng/L (ref <18)
Troponin I (High Sensitivity): 727 ng/L (ref <18)
Troponin I (High Sensitivity): 553 ng/L (ref <18)

## 2023-07-05 LAB — HEMOGLOBIN A1C
Hgb A1c MFr Bld: 5.2 % (ref 4.8–5.6)
Mean Plasma Glucose: 102.54 mg/dL

## 2023-07-05 LAB — GLUCOSE, CAPILLARY
Glucose-Capillary: 150 mg/dL — ABNORMAL HIGH (ref 70–99)
Glucose-Capillary: 153 mg/dL — ABNORMAL HIGH (ref 70–99)
Glucose-Capillary: 130 mg/dL — ABNORMAL HIGH (ref 70–99)
Glucose-Capillary: 126 mg/dL — ABNORMAL HIGH (ref 70–99)
Glucose-Capillary: 179 mg/dL — ABNORMAL HIGH (ref 70–99)
Glucose-Capillary: 167 mg/dL — ABNORMAL HIGH (ref 70–99)
Glucose-Capillary: 200 mg/dL — ABNORMAL HIGH (ref 70–99)

## 2023-07-05 LAB — TSH: TSH: 2.373 u[IU]/mL (ref 0.350–4.500)

## 2023-07-05 LAB — T4, FREE: Free T4: 1.36 ng/dL — ABNORMAL HIGH (ref 0.61–1.12)

## 2023-07-05 LAB — TRIGLYCERIDES: Triglycerides: 70 mg/dL (ref <150)

## 2023-07-05 LAB — PROCALCITONIN: Procalcitonin: 5.22 ng/mL

## 2023-07-05 LAB — LACTIC ACID, PLASMA: Lactic Acid, Venous: 0.9 mmol/L (ref 0.5–1.9)

## 2023-07-05 LAB — HEPARIN LEVEL (UNFRACTIONATED): Heparin Unfractionated: 0.29 [IU]/mL — ABNORMAL LOW (ref 0.30–0.70)

## 2023-07-05 MED ORDER — PANTOPRAZOLE SODIUM 40 MG IV SOLR
40.0000 mg | Freq: Every day | INTRAVENOUS | Status: DC
  Administered 2023-07-06 – 2023-07-12 (×7): 40 mg via INTRAVENOUS
  Filled 2023-07-05 (×7): qty 10

## 2023-07-05 MED ORDER — CLONIDINE HCL 0.1 MG PO TABS
0.2000 mg | ORAL_TABLET | Freq: Two times a day (BID) | ORAL | Status: DC
  Administered 2023-07-06 – 2023-07-07 (×2): 0.2 mg
  Filled 2023-07-05 (×3): qty 2

## 2023-07-05 MED ORDER — SEVELAMER CARBONATE 0.8 G PO PACK
0.8000 g | PACK | Freq: Three times a day (TID) | ORAL | Status: DC
  Administered 2023-07-07 (×2): 0.8 g
  Filled 2023-07-05 (×12): qty 1

## 2023-07-05 MED ORDER — ONDANSETRON HCL 4 MG/2ML IJ SOLN
4.0000 mg | Freq: Four times a day (QID) | INTRAMUSCULAR | Status: AC | PRN
  Administered 2023-07-06 – 2023-07-08 (×3): 4 mg via INTRAVENOUS
  Filled 2023-07-05 (×5): qty 2

## 2023-07-05 MED ORDER — ORAL CARE MOUTH RINSE
15.0000 mL | OROMUCOSAL | Status: DC
  Administered 2023-07-05 – 2023-07-06 (×18): 15 mL via OROMUCOSAL

## 2023-07-05 MED ORDER — HEPARIN BOLUS VIA INFUSION
1000.0000 [IU] | Freq: Once | INTRAVENOUS | Status: AC
  Administered 2023-07-05: 1000 [IU] via INTRAVENOUS
  Filled 2023-07-05: qty 1000

## 2023-07-05 MED ORDER — ACETAMINOPHEN 650 MG RE SUPP: 650.0000 mg | Freq: Four times a day (QID) | RECTAL | Status: DC | PRN

## 2023-07-05 MED ORDER — FREE WATER
30.0000 mL | Status: DC
Start: 2023-07-05 — End: 2023-07-06
  Administered 2023-07-05 – 2023-07-06 (×6): 30 mL

## 2023-07-05 MED ORDER — ATORVASTATIN CALCIUM 80 MG PO TABS
80.0000 mg | ORAL_TABLET | Freq: Every day | ORAL | Status: DC
  Administered 2023-07-06: 80 mg
  Filled 2023-07-05: qty 1

## 2023-07-05 MED ORDER — ONDANSETRON HCL 4 MG PO TABS
4.0000 mg | ORAL_TABLET | Freq: Four times a day (QID) | ORAL | Status: AC | PRN
  Administered 2023-07-08: 4 mg

## 2023-07-05 MED ORDER — CHLORHEXIDINE GLUCONATE CLOTH 2 % EX PADS
6.0000 | MEDICATED_PAD | Freq: Every day | CUTANEOUS | Status: DC
  Administered 2023-07-06 – 2023-07-11 (×6): 6 via TOPICAL

## 2023-07-05 MED ORDER — RENA-VITE PO TABS
1.0000 | ORAL_TABLET | Freq: Every day | ORAL | Status: DC
Start: 2023-07-05 — End: 2023-07-09
  Administered 2023-07-05 – 2023-07-06 (×2): 1
  Filled 2023-07-05 (×2): qty 1

## 2023-07-05 MED ORDER — VITAL AF 1.2 CAL PO LIQD
1000.0000 mL | ORAL | Status: DC
  Administered 2023-07-05: 1000 mL

## 2023-07-05 MED ORDER — ATORVASTATIN CALCIUM 80 MG PO TABS
80.0000 mg | ORAL_TABLET | Freq: Every day | ORAL | Status: DC
  Administered 2023-07-05: 80 mg via ORAL
  Filled 2023-07-05: qty 1

## 2023-07-05 MED ORDER — ASPIRIN 81 MG PO TBEC
81.0000 mg | DELAYED_RELEASE_TABLET | Freq: Every day | ORAL | Status: DC
  Administered 2023-07-05 – 2023-07-12 (×7): 81 mg via ORAL
  Filled 2023-07-05 (×7): qty 1

## 2023-07-05 MED ORDER — PREDNISONE 20 MG PO TABS
40.0000 mg | ORAL_TABLET | Freq: Every day | ORAL | Status: DC
Start: 2023-07-05 — End: 2023-07-09
  Administered 2023-07-05 – 2023-07-09 (×4): 40 mg
  Filled 2023-07-05 (×4): qty 2

## 2023-07-05 MED ORDER — ORAL CARE MOUTH RINSE: 15.0000 mL | OROMUCOSAL | Status: DC | PRN

## 2023-07-05 MED ORDER — CARVEDILOL 25 MG PO TABS
25.0000 mg | ORAL_TABLET | Freq: Two times a day (BID) | ORAL | Status: DC
Start: 2023-07-06 — End: 2023-07-09
  Administered 2023-07-06 – 2023-07-09 (×4): 25 mg
  Filled 2023-07-05 (×4): qty 1

## 2023-07-05 MED ORDER — ACETAMINOPHEN 325 MG PO TABS: 650.0000 mg | ORAL_TABLET | Freq: Four times a day (QID) | ORAL | Status: DC | PRN

## 2023-07-05 MED ORDER — PROSOURCE TF20 ENFIT COMPATIBL EN LIQD
60.0000 mL | Freq: Every day | ENTERAL | Status: DC
  Administered 2023-07-06: 60 mL
  Filled 2023-07-05: qty 60

## 2023-07-05 MED ORDER — EZETIMIBE 10 MG PO TABS
10.0000 mg | ORAL_TABLET | Freq: Every day | ORAL | Status: DC
  Administered 2023-07-06 – 2023-07-09 (×3): 10 mg
  Filled 2023-07-05 (×3): qty 1

## 2023-07-05 MED ORDER — INSULIN ASPART 100 UNIT/ML IJ SOLN
0.0000 [IU] | INTRAMUSCULAR | Status: DC
  Administered 2023-07-05: 1 [IU] via SUBCUTANEOUS
  Administered 2023-07-06: 2 [IU] via SUBCUTANEOUS
  Administered 2023-07-06 – 2023-07-09 (×4): 1 [IU] via SUBCUTANEOUS
  Administered 2023-07-10: 2 [IU] via SUBCUTANEOUS
  Administered 2023-07-10 – 2023-07-12 (×9): 1 [IU] via SUBCUTANEOUS
  Filled 2023-07-05 (×15): qty 1

## 2023-07-05 NOTE — Progress Notes (Signed)
 RT assisted Dr. Malka and Lonell Moose NP with bedside bronchoscopy. Consent and Time out were obtained and performed by appropriate personnel prior to start of procedure.  Patients FIO2 was increased to 100% for procedure and was able to be successfully weaned back to 40% after procedure was completed.   Disposable AMBU bronchoscope was used for procedure.  Scope Lot #: [id number] Time Scope placed into airway: 1500 Time Scope Removed from airway: 1515  1 Respiratory culture obtained labeled and took to lab for analysis.  PT tolerated procedure well.

## 2023-07-05 NOTE — Progress Notes (Signed)
 ANTICOAGULATION CONSULT NOTE  Pharmacy Consult for heparin  infusion Indication: VTE Tx  Allergies  Allergen Reactions   2,4-D Dimethylamine Swelling   Other Swelling   Tetanus Antitoxin Itching   Tetanus Toxoid    Tetanus Toxoids Itching    Patient Measurements: Height: 5' 1 (154.9 cm) Weight: 60.8 kg (134 lb) IBW/kg (Calculated) : 47.8 Heparin  Dosing Weight: 60.8 kg  Vital Signs: Temp: 100.6 F (38.1 C) (01/13 1330) Temp Source: Oral (01/13 1330) BP: 118/41 (01/13 1330) Pulse Rate: 81 (01/13 1330)  Labs: Recent Labs    07/04/23 1348 07/04/23 1541 07/05/23 0403 07/05/23 0754 07/05/23 1422  HGB 11.9*  --  10.0*  --   --   HCT 40.5  --  31.6*  --   --   PLT 295  --  264  --   --   APTT  --  25  --   --   --   LABPROT  --  15.3*  --   --   --   INR  --  1.2  --   --   --   HEPARINUNFRC  --   --   --   --  0.29*  CREATININE 7.07*  --  8.01*  --   --   TROPONINIHS 100* 169* 795* 727*  --     Estimated Creatinine Clearance: 5.6 mL/min (A) (by C-G formula based on SCr of 8.01 mg/dL (H)).   Medical History: Past Medical History:  Diagnosis Date   Diabetes mellitus without complication (HCC)    Hypertension    Renal disorder     Assessment: Pt is a 68 yo female presenting to ED c/o SOB, found with acute symptomatic nonocclusive RUExt DVT.  Goal of Therapy:  Heparin  level 0.3-0.7 units/ml Monitor platelets by anticoagulation protocol: Yes   Plan:  Bolus 1000 units x 1 Increase heparin  infusion to 1100 units/hr Will recheck HL in 8 hr after rate change CBC daily while on heparin   Christy Curry A Lexy Meininger, PharmD Clinical Pharmacist 07/05/2023 3:07 PM

## 2023-07-05 NOTE — Progress Notes (Signed)
 MD aware pt only has one peripheral iv, currently waiting for IV team for PICC line placement.

## 2023-07-05 NOTE — Progress Notes (Signed)
 Contacted [facility] again per daughter's request to initiate transfer following change in patient's condition requiring intubation. Per [facility] transfer center, MICU is currently at capacity and we should call them back in the morning for bed availability. Notified patient's daughter that we will attempt to call [facility] back to check if beds are available.   Almarie Nose, DNP, CCRN, FNP-C, AGACNP-BC Acute Care & Family Nurse Practitioner  [facility]  See Amion for personal pager PCCM on call pager [phone] until 7 am

## 2023-07-05 NOTE — Significant Event (Signed)
 Chart reviewed.  Patient with respiratory failure admitted to TRH service on NIPPV.  During the course of the night patient's respiratory effort had increased declining pulse oximetry.  Was on nasal cannula at this time but was emergently intubated by EDP.  Overnight NP consulted.  ICU consulted.  Patient noted to be intubated and sedated on mechanical ventilatory support. [facility] hospitalist service to sign off at this time.  Primary care assumed by PCCM team.  Please contact our service when patient stable for transfer back to general medical service  Calvin Robson MD  No charge

## 2023-07-05 NOTE — Progress Notes (Signed)
 Pt transported to ICU 8 on the vent without incident. Pt remains on the vent and is tol well at this time. Report given to ICU RT

## 2023-07-05 NOTE — Plan of Care (Signed)

## 2023-07-05 NOTE — Procedures (Signed)
 Bronchoscopy Procedure Note  Christy Curry  [id number]  1955/09/14  Date:07/05/23  Time:3:22 PM   Provider Performing:Jean-Pierre Selinda Korzeniewski   Procedure(s):  Flexible Bronchoscopy [phone])  Indication(s) RML, RLL consolidation with possible mucous plugging.  Consent Risks of the procedure as well as the alternatives and risks of each were explained to the patient and/or caregiver.  Consent for the procedure was obtained and is signed in the bedside chart  Anesthesia General, patient already intubated in the ICU.   Time Out Verified patient identification, verified procedure, site/side was marked, verified correct patient position, special equipment/implants available, medications/allergies/relevant history reviewed, required imaging and test results available.  Sterile Technique Usual hand hygiene, masks, gowns, and gloves were used  Procedure Description Bronchoscope advanced through endotracheal tube and into airway.  Airways were examined down to subsegmental level with findings noted below.   Following diagnostic evaluation, BAL(s) performed in RLL with normal saline and return of 15cc serous fluid  Findings:  Carina was intact. Secretions noted in the right lower lobe that were suctioned and cleared. No signs of mucous plugging. Left main appeared intact. No significant secretions noted on the left.   Complications/Tolerance None; patient tolerated the procedure well. Chest X-ray is not needed post procedure.  EBL Minimal  Specimen(s) RLL wash sent for gram stain and culture.    Darrin Barn, MD [facility] 07/05/2023 3:27 PM

## 2023-07-05 NOTE — Progress Notes (Signed)
 eLink Physician-Brief Progress Note Patient Name: Christy Curry DOB: [birth year] MRN: [id number]   Date of Service  07/05/2023  HPI/Events of Note  34 F ESRD on HD, HTN, dyslipidemia, DM, aortic valve stenosis, being treated for RSV pneumonia and has been tolerating BiPap but is now intubated.  Ongoing central line insertion Synchronized on vent and does not appear to be in distress  eICU Interventions  Respiratory failure in the background of RSV and possible secondary bacterial pneumonia on ceftriaxone  and azithromycin      Intervention Category Evaluation Type: New Patient Evaluation  Damien ONEIDA Grout 07/05/2023, 1:09 AM

## 2023-07-05 NOTE — Consult Note (Addendum)
 NAME:  Christy Curry, MRN:  [id number], DOB:  October 07, 1955, LOS: 1 ADMISSION DATE:  07/04/2023, CONSULTATION DATE:  07/04/23 REFERRING MD: Sherre, Amy CHIEF COMPLAINT:  Respiratory Distress   HPI  68 y.o female with significant PMH of PAD, T2DM, Diabetic neuropathy, GERD, vitreous hemorrhage of left eye, HTN, Aortic Stenosis, Anemia of CKD, ESRD on HD MWF currently on kidney transplant wait list at [facility] who presented to the ED with chief complaints of respiratory distress.   ED Course: Initial vital signs showed HR of 111 beats/minute, BP 189/73 mm Hg, the RR 22 breaths/minute, and the oxygen saturation 100% on  BiPAP and a temperature of 98.58F (36.9C). Pertinent Labs/Diagnostics Findings: Na+/ K+: 140/3.8 Glucose: 171 BUN/Cr.:28/7.07 WBC: 21.6K/L without bands or neutrophil predominance    PCT: 0.34 COVID PCR: Negative, RSV Positive troponin: 100  ABG: pO2 195; pCO2 45; pH 7.4;  HCO3 27.9, %O2 Sat 97.2.  CXR> see full report below Medication administered in the ED: Patient started on broad-spectrum antibiotics Ceftriaxone  and Azithromycin  for sepsis due to pneumonia. She was placed on BiPAP and given nitropaste Disposition: Admitted to hospitalist service  SEE SIGNIFICANT EVENT BELOW  Past Medical History  PAD, T2DM, Diabetic neuropathy, GERD, vitreous hemorrhage of left eye, HTN, Aortic Stenosis, Anemia of CKD, ESRD on HD MWF currently on kidney transplant wait list at [facility]   1/12: Admit to stepdown with acute hypoxic respiratory failure secondary to RSV and possible secondary bacterial pneumonia.  Failed BiPAP requiring mechanical ventilatory support.  PCCM consulted  Consults:  Nephrology  Procedures:  1/12: Intubation  Significant Diagnostic Tests:  1/12: Chest Xray> IMPRESSION: 1. Asymmetric interstitial thickening in the right lung with hazy airspace disease in the right mid lung and left lower lung, concerning for moderate  pulmonary edema versus multifocal pneumonia. 2. Suspected small bilateral pleural effusions  1/12: US  DVT RUE> IMPRESSION: 1. Nonocclusive superficial venous thrombosis in the right basilic vein. Otherwise unremarkable.  1/13: CTA Chest>  Interim History / Subjective:      Micro Data:  1/12: SARS-CoV-2 PCR> negative 1/12: Influenza PCR> negative 1/12: RSV>positive 1/12: Blood culture x2> 1/12: MRSA PCR>>   Antimicrobials:  Azithromycin  1/12> Ceftriaxone  1/12>  OBJECTIVE  Blood pressure (!) 163/89, pulse 83, temperature (!) 95.4 F (35.2 C), resp. rate 18, height 5' 1 (1.549 m), weight 60.8 kg, SpO2 100%.    Vent Mode: PRVC FiO2 (%):  [60 %-100 %] 100 % Set Rate:  [18 bmp] 18 bmp Vt Set:  [370 mL] 370 mL PEEP:  [5 cmH20] 5 cmH20   Intake/Output Summary (Last 24 hours) at 07/05/2023 0222 Last data filed at 07/05/2023 0030 Gross per 24 hour  Intake 127.73 ml  Output 105 ml  Net 22.73 ml   Filed Weights   07/04/23 1350  Weight: 60.8 kg   Physical Examination  GENERAL: 68 year-old critically ill patient lying in the bed intubated and sedated EYES: PEERLA. No scleral icterus. Extraocular muscles intact.  HEENT: Head atraumatic, normocephalic. Oropharynx and nasopharynx clear.  NECK:  No JVD, supple  LUNGS: Normal breath sounds bilaterally.  No use of accessory muscles of respiration.  CARDIOVASCULAR: S1, S2 normal. No murmurs, rubs, or gallops.  ABDOMEN: Soft, NTND EXTREMITIES: Left upper extremity dialysis fistula .  Right upper extremity swelling without erythema.  Capillary refill < 3 seconds in all extremities. Pulses palpable distally. NEUROLOGIC: The patient is intubated and sedated. No focal neurological deficit appreciated. Cranial nerves are intact.  SKIN: No obvious rash, lesion, or ulcer. Warm to touch Labs/imaging that I havepersonally reviewed  (right click and Reselect all SmartList Selections daily)     Labs   CBC: Recent Labs  Lab  07/04/23 1348  WBC 21.6*  NEUTROABS 18.5*  HGB 11.9*  HCT 40.5  MCV 106.9*  PLT 295    Basic Metabolic Panel: Recent Labs  Lab 07/04/23 1348  NA 140  K 3.8  CL 106  CO2 19*  GLUCOSE 171*  BUN 28*  CREATININE 7.07*  CALCIUM  9.1  MG 2.5*   GFR: Estimated Creatinine Clearance: 6.4 mL/min (A) (by C-G formula based on SCr of 7.07 mg/dL (H)). Recent Labs  Lab 07/04/23 1348 07/04/23 1541  PROCALCITON  --  0.34  WBC 21.6*  --   LATICACIDVEN  --  1.7    Liver Function Tests: Recent Labs  Lab 07/04/23 1348  AST 23  ALT 13  ALKPHOS 81  BILITOT 0.7  PROT 7.2  ALBUMIN 3.9   Recent Labs  Lab 07/04/23 1348  LIPASE 21   No results for input(s): AMMONIA in the last 168 hours.  ABG    Component Value Date/Time   PHART 7.32 (L) 07/05/2023 0010   PCO2ART 51 (H) 07/05/2023 0010   PO2ART 152 (H) 07/05/2023 0010   HCO3 26.3 07/05/2023 0010   ACIDBASEDEF 0.5 07/05/2023 0010   O2SAT 97.6 07/05/2023 0010     Coagulation Profile: Recent Labs  Lab 07/04/23 1541  INR 1.2    Cardiac Enzymes: No results for input(s): CKTOTAL, CKMB, CKMBINDEX, TROPONINI in the last 168 hours.  HbA1C: Hgb A1c MFr Bld  Date/Time Value Ref Range Status  11/26/2022 09:04 AM 5.7 (H) 4.8 - 5.6 % Final    Comment:    (NOTE)         Prediabetes: 5.7 - 6.4         Diabetes: >6.4         Glycemic control for adults with diabetes: <7.0    CBG: Recent Labs  Lab 07/04/23 1637 07/04/23 2207 07/04/23 2309  GLUCAP 156* 150* 171*    Review of Systems:   Unable to be obtained secondary to the patient's intubated and sedated status.   Past Medical History  She,  has a past medical history of Diabetes mellitus without complication (HCC), Hypertension, and Renal disorder.   Surgical History    Past Surgical History:  Procedure Laterality Date   av fistula Left      Social History   reports that she has never smoked. She has never used smokeless tobacco. She reports  that she does not drink alcohol and does not use drugs.   Family History   Her family history includes Diabetes in her father; Kidney disease in her mother; Peripheral Artery Disease (age of onset: 53) in her daughter.   Allergies Allergies  Allergen Reactions   2,4-D Dimethylamine Swelling   Other Swelling   Tetanus Antitoxin Itching   Tetanus Toxoid    Tetanus Toxoids Itching     Home Medications  Prior to Admission medications   Medication Sig Start Date End Date Taking? Authorizing Provider  atorvastatin  (LIPITOR ) 20 MG tablet Take 1 tablet by mouth at bedtime. 01/05/23  Yes [provider]  b complex-vitamin c-folic acid  (NEPHRO-VITE) 0.8 MG TABS tablet Take 1 tablet by mouth daily. 09/02/22  Yes [provider]  carvedilol  (COREG ) 25 MG tablet Take 25 mg by mouth 2 (two) times daily with a meal. 03/29/19  Yes [provider]  cloNIDine  (CATAPRES ) 0.2 MG tablet Take 0.2 mg by mouth 2 (two) times daily. 03/03/23  Yes [provider]  ezetimibe  (ZETIA ) 10 MG tablet Take 1 tablet (10 mg total) by mouth daily. 03/22/23  Yes Gollan, Timothy J, MD  losartan  (COZAAR ) 100 MG tablet Take 100 mg by mouth daily.   Yes [provider]  ondansetron  (ZOFRAN ) 4 MG tablet Take 4 mg by mouth every 8 (eight) hours as needed. 05/14/23  Yes [provider]  pantoprazole  (PROTONIX ) 40 MG tablet Take 1 tablet (40 mg total) by mouth daily for 14 days. 05/26/23 07/04/23 Yes Ernest Ronal BRAVO, MD  sevelamer  carbonate (RENVELA ) 0.8 g PACK packet Take 0.8 g by mouth 3 (three) times daily with meals. 05/24/23  Yes [provider]  amLODipine  (NORVASC ) 10 MG tablet Take 1 tablet (10 mg total) by mouth daily. Patient not taking: Reported on 07/04/2023 03/22/23   Gollan, Timothy J, MD  cloNIDine  (CATAPRES ) 0.1 MG tablet Take 1 tablet by mouth 2 (two) times daily. Patient not taking: Reported on 07/04/2023 03/27/23   [provider]  glipiZIDE (GLUCOTROL  XL) 2.5 MG 24 hr tablet Take 2.5 mg by mouth daily. Patient not taking: Reported on 05/27/2023 07/27/22   [provider]  Scheduled Meds:  atorvastatin   20 mg Oral QHS   carvedilol   25 mg Oral BID WC   Chlorhexidine  Gluconate Cloth  6 each Topical Daily   cloNIDine   0.2 mg Oral BID   docusate  100 mg Per Tube BID   ezetimibe   10 mg Oral Daily   fentaNYL  (SUBLIMAZE ) injection  25 mcg Intravenous Once   insulin  aspart  0-5 Units Subcutaneous QHS   insulin  aspart  0-6 Units Subcutaneous TID WC   losartan   100 mg Oral Daily   mouth rinse  15 mL Mouth Rinse Q2H   pantoprazole   40 mg Oral Daily   polyethylene glycol  17 g Per Tube Daily   sevelamer  carbonate  0.8 g Oral TID WC   Continuous Infusions:  azithromycin      cefTRIAXone  (ROCEPHIN )  IV     fentaNYL  infusion INTRAVENOUS 25 mcg/hr (07/05/23 0230)   heparin  950 Units/hr (07/05/23 0230)   lactated ringers      propofol  (DIPRIVAN ) infusion 5 mcg/kg/min (07/05/23 0230)   PRN Meds:.acetaminophen  **OR** acetaminophen , fentaNYL , hydrALAZINE , labetalol , ondansetron  **OR** ondansetron  (ZOFRAN ) IV, mouth rinse, oxyCODONE -acetaminophen    Active Hospital Problem list   See systems below  Assessment & Plan:  #Acute Hypoxic Respiratory Failure #RSV Multifocal Pneumonia   Presents with respiratory failure secondary to RSV infection, failed BiPAP requiring intubation. Also treating with steroids and nebulizers in addition to supportive care for RSV.  -full mechanical support 6-8cc/kg/Vt  -titrate FiO2, PEEP to maintain O2 sat >90%  -Lung protective ventilation  -PRN Chest X-ray & ABG -PRN and scheduled bronchodilators -SAT/SBT when appropriate  -prn fentanyl , prop for RASS -1   #Sepsis due Multifocal Pneumonia, RSV  meets SIRS criteria: Heart Rate 84 beats/minute, Respiratory Rate 27 breaths/minute, Temperature 91.2(32. 9) -F/u cultures, trend lactic/ PCT -Monitor WBC/ fever curve -IV antibiotics: C ceftriaxone  and  azithromycin  -IVF hydration as needed  #ESRD on HD MWF Last HD on Friday   On transplant wait list at [facility] -trend BMP  -Replace electrolytes as indicated -Strict intake/output  -Avoid nephrotoxic medications as able  -Nephrology consult for HD per recommendations   #Anemia of CKD  -trend CBC  -Monitor for s/sx of bleeding  -transfuse for  hgb <7 -Anemia panel pending    #Type II diabetes mellitus  -CBG's q4hrs  -SSI  -Follow hypo/hyperglycemic protocol  -Hold home glipizide  #Toxic Metabolic Encephalopathy -Maintain a RASS goal of -1 -propofol  to maintain RASS goal, fentanyl  for pain control -Daily wake up assessment   Elevated Troponin/NSTEMI-suspect demand ischemia? #Hypertension #History of Aortic Valve Stenosis, CAD, PAD, HLD Recent hx of abnormal stress test plan for Left heart cath in 07/29/23 -trend troponin until peaked -Hemodynamically stable, not on vasopressors at the moment.  -Continue Heparin  gtt -Continue atorvastatin  and Zetia  -Resume BP meds as tolerated -obtain 2D Echo -Cardiology consult as apprpriate  #RUE DVT -Heparin  as above  Best practice:  Diet:  Tube Feed  Pain/Anxiety/Delirium protocol (if indicated): Yes (RASS goal -1) VAP protocol (if indicated): Yes DVT prophylaxis: Systemic AC GI prophylaxis: PPI Glucose control:  SSI Yes Central venous access:  N/A Arterial line:  N/A Foley:  Yes, and it is still needed Mobility:  bed rest  PT consulted: N/A Last date of multidisciplinary goals of care discussion [Discussed plan of care with daughter at bedside] Code Status:  full code Disposition: ICU   = Goals of Care = Code Status Order: FULL  Primary Emergency Contact: brown,brittany Discussed goals of care with patient's daughter Brittany at the bedside.  Family and patient wishes to pursue full aggressive treatment and intervention options, including CPR and intubation, but goals of care will be addressed on going with family if that  should become necessary.   Critical care time: 45 minutes        Almarie Nose DNP, CCRN, FNP-C, AGACNP-BC Acute Care & Family Nurse Practitioner [facility] PCCM on call pager [phone]

## 2023-07-05 NOTE — Progress Notes (Signed)
 [facility]  ROUNDING NOTE   Subjective:   Patient seen and evaluated at bedside. Unfortunately now critically ill as she is on the ventilator. Found to have RSV infection. Patient is a known dialysis patient and undergoes treatment on MWF. Due for dialysis treatment today. Patient has left upper extremity AV graft in place.  Objective:  Vital signs in last 24 hours:  Temp:  [91.2 F (32.9 C)-101.3 F (38.5 C)] 101.3 F (38.5 C) (01/13 0900) Pulse Rate:  [79-111] 103 (01/13 0900) Resp:  [6-31] 16 (01/13 0900) BP: (127-189)/(49-117) 139/68 (01/13 0900) SpO2:  [92 %-100 %] 95 % (01/13 0900) FiO2 (%):  [40 %-100 %] 50 % (01/13 0900) Weight:  [60.8 kg] 60.8 kg (01/12 1350)  Weight change:  Filed Weights   07/04/23 1350  Weight: 60.8 kg    Intake/Output: I/O last 3 completed shifts: In: 239.2 [I.V.:114.2; IV Piggyback:124.9] Out: 205 [Urine:155; Emesis/NG output:50]   Intake/Output this shift:  Total I/O In: 49.1 [I.V.:49.1] Out: -   Physical Exam: General: Critically ill-appearing  Head: Normocephalic, atraumatic.  Endotracheal tube in place  Neck: Supple  Lungs:  Coarse breath sounds bilateral, vent assisted  Heart: S1S2 no rubs  Abdomen:  Soft, nontender, bowel sounds present  Extremities: Trace peripheral edema.  Neurologic: Awake, alert, following commands  Skin: No acute rash  Access: Left upper extremity AV graft    Basic Metabolic Panel: Recent Labs  Lab 07/04/23 1348 07/05/23 0403  NA 140 144  K 3.8 3.7  CL 106 104  CO2 19* 23  GLUCOSE 171* 188*  BUN 28* 35*  CREATININE 7.07* 8.01*  CALCIUM  9.1 8.8*  MG 2.5*  --     Liver Function Tests: Recent Labs  Lab 07/04/23 1348  AST 23  ALT 13  ALKPHOS 81  BILITOT 0.7  PROT 7.2  ALBUMIN 3.9   Recent Labs  Lab 07/04/23 1348  LIPASE 21   No results for input(s): AMMONIA in the last 168 hours.  CBC: Recent Labs  Lab 07/04/23 1348 07/05/23 0403  WBC 21.6* 13.5*   NEUTROABS 18.5*  --   HGB 11.9* 10.0*  HCT 40.5 31.6*  MCV 106.9* 97.5  PLT 295 264    Cardiac Enzymes: No results for input(s): CKTOTAL, CKMB, CKMBINDEX, TROPONINI in the last 168 hours.  BNP: Invalid input(s): POCBNP  CBG: Recent Labs  Lab 07/04/23 2207 07/04/23 2309 07/05/23 0034 07/05/23 0408 07/05/23 0827  GLUCAP 150* 171* 150* 153* 130*    Microbiology: Results for orders placed or performed during the hospital encounter of 07/04/23  Resp panel by RT-PCR (RSV, Flu A&B, Covid) Anterior Nasal Swab     Status: Abnormal   Collection Time: 07/04/23  1:48 PM   Specimen: Anterior Nasal Swab  Result Value Ref Range Status   SARS Coronavirus 2 by RT PCR NEGATIVE NEGATIVE Final    Comment: (NOTE) SARS-CoV-2 target nucleic acids are NOT DETECTED.  The SARS-CoV-2 RNA is generally detectable in upper respiratory specimens during the acute phase of infection. The lowest concentration of SARS-CoV-2 viral copies this assay can detect is 138 copies/mL. A negative result does not preclude SARS-Cov-2 infection and should not be used as the sole basis for treatment or other patient management decisions. A negative result may occur with  improper specimen collection/handling, submission of specimen other than nasopharyngeal swab, presence of viral mutation(s) within the areas targeted by this assay, and inadequate number of viral copies(<138 copies/mL). A negative result must be combined with clinical  observations, patient history, and epidemiological information. The expected result is Negative.  Fact Sheet for Patients:  bloggercourse.com  Fact Sheet for Healthcare Providers:  seriousbroker.it  This test is no t yet approved or cleared by the [place]  FDA and  has been authorized for detection and/or diagnosis of SARS-CoV-2 by FDA under an Emergency Use Authorization (EUA). This EUA will remain  in effect  (meaning this test can be used) for the duration of the COVID-19 declaration under Section 564(b)(1) of the Act, 21 U.S.C.section 360bbb-3(b)(1), unless the authorization is terminated  or revoked sooner.       Influenza A by PCR NEGATIVE NEGATIVE Final   Influenza B by PCR NEGATIVE NEGATIVE Final    Comment: (NOTE) The Xpert Xpress SARS-CoV-2/FLU/RSV plus assay is intended as an aid in the diagnosis of influenza from Nasopharyngeal swab specimens and should not be used as a sole basis for treatment. Nasal washings and aspirates are unacceptable for Xpert Xpress SARS-CoV-2/FLU/RSV testing.  Fact Sheet for Patients: bloggercourse.com  Fact Sheet for Healthcare Providers: seriousbroker.it  This test is not yet approved or cleared by the [place]  FDA and has been authorized for detection and/or diagnosis of SARS-CoV-2 by FDA under an Emergency Use Authorization (EUA). This EUA will remain in effect (meaning this test can be used) for the duration of the COVID-19 declaration under Section 564(b)(1) of the Act, 21 U.S.C. section 360bbb-3(b)(1), unless the authorization is terminated or revoked.     Resp Syncytial Virus by PCR POSITIVE (A) NEGATIVE Final    Comment: (NOTE) Fact Sheet for Patients: bloggercourse.com  Fact Sheet for Healthcare Providers: seriousbroker.it  This test is not yet approved or cleared by the [place]  FDA and has been authorized for detection and/or diagnosis of SARS-CoV-2 by FDA under an Emergency Use Authorization (EUA). This EUA will remain in effect (meaning this test can be used) for the duration of the COVID-19 declaration under Section 564(b)(1) of the Act, 21 U.S.C. section 360bbb-3(b)(1), unless the authorization is terminated or revoked.  Performed at [facility], [address] [place], [place] [address]   Blood Culture  (routine x 2)     Status: None (Preliminary result)   Collection Time: 07/04/23  3:41 PM   Specimen: BLOOD  Result Value Ref Range Status   Specimen Description BLOOD BLOOD RIGHT WRIST  Final   Special Requests   Final    BOTTLES DRAWN AEROBIC AND ANAEROBIC Blood Culture adequate volume   Culture   Final    NO GROWTH < 12 HOURS Performed at [facility], [address] [place], [place] [address]    Report Status PENDING  Incomplete  Blood Culture (routine x 2)     Status: None (Preliminary result)   Collection Time: 07/04/23  3:41 PM   Specimen: BLOOD  Result Value Ref Range Status   Specimen Description BLOOD BLOOD RIGHT HAND  Final   Special Requests   Final    BOTTLES DRAWN AEROBIC AND ANAEROBIC Blood Culture adequate volume   Culture   Final    NO GROWTH < 12 HOURS Performed at [facility], [address], [place], [place] [address]    Report Status PENDING  Incomplete  MRSA Next Gen by PCR, Nasal     Status: None   Collection Time: 07/04/23  6:20 PM   Specimen: Nasal Mucosa; Nasal Swab  Result Value Ref Range Status   MRSA by PCR Next Gen NOT DETECTED NOT DETECTED Final  Comment: (NOTE) The GeneXpert MRSA Assay (FDA approved for NASAL specimens only), is one component of a comprehensive MRSA colonization surveillance program. It is not intended to diagnose MRSA infection nor to guide or monitor treatment for MRSA infections. Test performance is not FDA approved in patients less than 79 years old. Performed at [facility], [address] [place], [place] [address]     Coagulation Studies: Recent Labs    07/04/23 1541  LABPROT 15.3*  INR 1.2    Urinalysis: Recent Labs    07/04/23 2334  COLORURINE YELLOW*  LABSPEC 1.012  PHURINE 8.0  GLUCOSEU >=500*  HGBUR NEGATIVE  BILIRUBINUR NEGATIVE  KETONESUR NEGATIVE  PROTEINUR >=300*  NITRITE NEGATIVE  LEUKOCYTESUR NEGATIVE      Imaging: DG Abd 1 View Result Date:  07/05/2023 CLINICAL DATA:  NG tube placement EXAM: ABDOMEN - 1 VIEW COMPARISON:  None Available. FINDINGS: Enteric tube terminates in the gastric cardia. IMPRESSION: Enteric tube terminates in the gastric cardia. Electronically Signed   By: Pinkie Pebbles M.D.   On: 07/05/2023 00:04   DG Chest Port 1 View Result Date: 07/05/2023 CLINICAL DATA:  Post intubation EXAM: PORTABLE CHEST 1 VIEW COMPARISON:  07/04/2023 at 1354 hours FINDINGS: Endotracheal tube terminates 2.9 cm above the carina. Progressive patchy airspace opacity in the right upper/mid lung, suggesting infection/pneumonia. Underlying multifocal patchy opacities, likely reflecting multifocal pneumonia, less likely underlying interstitial edema. Possible small right pleural effusion.  No pneumothorax. The heart is normal in size.  Left axillary vascular stent. Enteric tube terminates in the proximal stomach. IMPRESSION: Endotracheal tube terminates 2.9 cm above the carina. Progressive right lung pneumonia.  Small right pleural effusion. Underlying multifocal pneumonia, less likely interstitial edema. Electronically Signed   By: Pinkie Pebbles M.D.   On: 07/05/2023 00:04   US  Venous Img Upper Uni Right(DVT) Result Date: 07/04/2023 CLINICAL DATA:  Right arm pain EXAM: RIGHT UPPER EXTREMITY VENOUS DOPPLER ULTRASOUND TECHNIQUE: Gray-scale sonography with graded compression, as well as color Doppler and duplex ultrasound were performed to evaluate the upper extremity deep venous system from the level of the subclavian vein and including the jugular, axillary, basilic, radial, ulnar and upper cephalic vein. Spectral Doppler was utilized to evaluate flow at rest and with distal augmentation maneuvers. COMPARISON:  None Available. FINDINGS: Contralateral Subclavian Vein: Respiratory phasicity is normal and symmetric with the symptomatic side. No evidence of thrombus. Normal compressibility. Internal Jugular Vein: No evidence of thrombus. Normal  compressibility, respiratory phasicity and response to augmentation. Subclavian Vein: No evidence of thrombus. Normal compressibility, respiratory phasicity and response to augmentation. Axillary Vein: No evidence of thrombus. Normal compressibility, respiratory phasicity and response to augmentation. Cephalic Vein: No evidence of thrombus. Normal compressibility, respiratory phasicity and response to augmentation. Basilic Vein: Nonocclusive thrombus in the distal basilic vein (superficial venous thrombosis). Brachial Veins: No evidence of thrombus. Normal compressibility, respiratory phasicity and response to augmentation. Radial Veins: No evidence of thrombus. Normal compressibility, respiratory phasicity and response to augmentation. Ulnar Veins: No evidence of thrombus. Normal compressibility, respiratory phasicity and response to augmentation. Venous Reflux:  None visualized. Other Findings:  None visualized. IMPRESSION: 1. Nonocclusive superficial venous thrombosis in the right basilic vein. Otherwise unremarkable. Electronically Signed   By: Ryan Salvage M.D.   On: 07/04/2023 20:00   DG Chest Portable 1 View Result Date: 07/04/2023 CLINICAL DATA:  Shortness of breath. EXAM: PORTABLE CHEST 1 VIEW COMPARISON:  Chest x-ray dated May 26, 2023. FINDINGS: Borderline cardiomegaly. Asymmetrically increased interstitial thickening in the right  lung. Hazy airspace disease in the right mid lung and left lower lung. Suspected small bilateral pleural effusions. No pneumothorax. No acute osseous abnormality. Left axillary stents noted. IMPRESSION: 1. Asymmetric interstitial thickening in the right lung with hazy airspace disease in the right mid lung and left lower lung, concerning for moderate pulmonary edema versus multifocal pneumonia. 2. Suspected small bilateral pleural effusions. Electronically Signed   By: Elsie ONEIDA Shoulder M.D.   On: 07/04/2023 14:09     Medications:    azithromycin       cefTRIAXone  (ROCEPHIN )  IV     fentaNYL  infusion INTRAVENOUS Stopped (07/05/23 0753)   heparin  950 Units/hr (07/05/23 0823)   propofol  (DIPRIVAN ) infusion Stopped (07/05/23 0753)    aspirin  EC  81 mg Oral Daily   atorvastatin   80 mg Oral QHS   carvedilol   25 mg Oral BID WC   Chlorhexidine  Gluconate Cloth  6 each Topical Daily   cloNIDine   0.2 mg Oral BID   docusate  100 mg Per Tube BID   ezetimibe   10 mg Oral Daily   fentaNYL  (SUBLIMAZE ) injection  25 mcg Intravenous Once   insulin  aspart  0-5 Units Subcutaneous QHS   insulin  aspart  0-6 Units Subcutaneous TID WC   losartan   100 mg Oral Daily   mouth rinse  15 mL Mouth Rinse Q2H   pantoprazole   40 mg Oral Daily   polyethylene glycol  17 g Per Tube Daily   predniSONE   40 mg Per Tube Q breakfast   sevelamer  carbonate  0.8 g Oral TID WC   acetaminophen  **OR** acetaminophen , fentaNYL , ondansetron  **OR** ondansetron  (ZOFRAN ) IV, mouth rinse  Assessment/ Plan:  68 y.o. female with past medical history of diabetes mellitus type 2, peripheral neuropathy, GERD, hypertension, aortic stenosis, anemia of chronic kidney disease, peripheral arterial disease, ESRD on HD MWF who presented with shortness of breath and found to have RSV infection.  [facility]/MWF/[facility] [address]/left upper extremity AV graft  1.  ESRD on HD MWF.  Patient will be due for hemodialysis treatment today.  Orders have been prepared.  Use left upper extremity AV graft.  2.  Acute respiratory failure secondary to RSV infection.  Patient currently on a ventilator.  Also on azithromycin  and ceftriaxone  to treat superimposed bacterial infection.  3.  Anemia of chronic kidney disease.  Lab Results  Component Value Date   HGB 10.0 (L) 07/05/2023   On Mircera as outpatient.  Hold off on adding this at the moment.  4.  Secondary hyperparathyroidism.  Maintain the patient on Renvela  0.8 g 3 times daily.   LOS: 1 Lynasia Meloche 1/13/202510:17 AM

## 2023-07-05 NOTE — Progress Notes (Signed)
 This RN is getting ready to accessed the fistula of patient. Bruit very faint. Accessed with 15 gauge needle to start dialysis. Blood flow very sluggish and  stopped half way this RN checked  fistula again and bruit is not present anymore. NP/MD made aware os the situation.

## 2023-07-05 NOTE — Progress Notes (Addendum)
 Patient has very poor vasculature, on ultrasound with internal jugular stenosis. Unable to cannulate bilateral internal jugular or femoral vein. Multiple attempts made in the ED by IV team, RN's and EDP to obtain access with no access. Consult to vascular to assist with central line placement.  Almarie Nose, DNP, CCRN, FNP-C, AGACNP-BC Acute Care & Family Nurse Practitioner  [facility]  See Amion for personal pager PCCM on call pager [phone] until 7 am

## 2023-07-05 NOTE — Progress Notes (Signed)
 Initial Nutrition Assessment  DOCUMENTATION CODES:   Not applicable  INTERVENTION:   Vital 1.2@50ml /hr- Initiate at 56ml/hr and increase by 10ml/hr q 8 hours until goal rate is reached.   ProSource TF 20- Give 60ml daily via tube, each supplement provides 80kcal and 20g of protein.   Free water  flushes 30ml q4 hours to maintain tube patency   Regimen provides 1520kcal/day, 110g/day protein and 1182ml/day of free water .   Rena-vit daily via tube   Daily weights  Pt at refeed risk; recommend monitor potassium, magnesium and phosphorus labs daily until stable  NUTRITION DIAGNOSIS:   Inadequate oral intake related to inability to eat (pt sedated and ventilated) as evidenced by NPO status.  GOAL:   Provide needs based on ASPEN/SCCM guidelines  MONITOR:   Vent status, Labs, Weight trends, TF tolerance, I & O's, Skin  REASON FOR ASSESSMENT:   Consult Enteral/tube feeding initiation and management  ASSESSMENT:   68 year old female patient with a past medical history of peripheral artery disease, DVT, type 2 diabetes mellitus, diabetic neuropathy, GERD, hypertension, aortic stenosis, anemia of CKD, end-stage renal disease on HD via left AV fistula MWF currently on the kidney transplant list at [facility] who is admitted with NSTEMI and acute hypoxic respiratory failure secondary to RSV pneumonia with possible superimposed bacterial pneumonia and mucous plugging.  Pt sedated and ventilated. OGT noted to be coiled with tip at the gastric cardia; tube adjusted today and KUB is pending. Will plan to initiate tube feeds today. Pt is likely at refeed risk. Per chart, pt is down 10lbs(7%) over the past 7 months; this is not significant. Plan is for HD today.   Medications reviewed and include: aspirin , colace, insulin , protonix , prednisone , renvela , miralax , azithromycin , ceftriaxone , propofol    Labs reviewed: Na 144 wnl, K 3.7 wnl, BUN 35(H), creat 8.01(H) Wbc- 13.5(H), Hgb 10.0(L), Hct  31.6(L) Cbgs- 126, 130, 153, 150 x 24 hrs  AIC 5.2- 1/13  Patient is currently intubated on ventilator support MV: 6.6 L/min Temp (24hrs), Avg:96.3 F (35.7 C), Min:91.2 F (32.9 C), Max:101.5 F (38.6 C)  Propofol : 5.47 ml/hr- provides 144kcal/day   MAP >83mmHg   UOP-   NUTRITION - FOCUSED PHYSICAL EXAM:  Flowsheet Row Most Recent Value  Orbital Region No depletion  Upper Arm Region Moderate depletion  Thoracic and Lumbar Region No depletion  Buccal Region No depletion  Temple Region Mild depletion  Clavicle Bone Region No depletion  Clavicle and Acromion Bone Region No depletion  Scapular Bone Region No depletion  Dorsal Hand No depletion  Patellar Region Severe depletion  Anterior Thigh Region Severe depletion  Posterior Calf Region Severe depletion  Edema (RD Assessment) None  Hair Reviewed  Eyes Reviewed  Mouth Reviewed  Skin Reviewed  Nails Reviewed   Diet Order:   Diet Order     None      EDUCATION NEEDS:   No education needs have been identified at this time  Skin:  Skin Assessment: Reviewed RN Assessment  Last BM:  PTA  Height:   Ht Readings from Last 1 Encounters:  07/05/23 5' 1 (1.549 m)    Weight:   Wt Readings from Last 1 Encounters:  07/04/23 60.8 kg    Ideal Body Weight:  47.7 kg  BMI:  Body mass index is 25.32 kg/m.  Estimated Nutritional Needs:   Kcal:  1475kcal/day  Protein:  95-110g/day  Fluid:  UOP +1L  Augustin Shams MS, RD, LDN If unable to be reached, please send  secure chat to RD inpatient available from 8:00a-4:00p daily

## 2023-07-05 NOTE — Procedures (Signed)
 Central Venous Catheter Insertion Procedure Note  Christy Curry  [id number]  01/01/1956  Date:07/05/23  Time:3:17 PM   Provider Performing:Mccabe Christy Curry   Procedure: Insertion of Non-tunneled Central Venous [phone]) with US  guidance (23062)   Indication(s) Difficult access and Hemodialysis  Consent Risks of the procedure as well as the alternatives and risks of each were explained to the patient and/or caregiver.  Consent for the procedure was obtained and is signed in the bedside chart  Anesthesia Topical only with 1% lidocaine    Timeout Verified patient identification, verified procedure, site/side was marked, verified correct patient position, special equipment/implants available, medications/allergies/relevant history reviewed, required imaging and test results available.  Sterile Technique Maximal sterile technique including full sterile barrier drape, hand hygiene, sterile gown, sterile gloves, mask, hair covering, sterile ultrasound probe cover (if used).  Procedure Description Area of catheter insertion was cleaned with chlorhexidine  and draped in sterile fashion.  With real-time ultrasound guidance a HD catheter was placed into the right femoral vein. Nonpulsatile blood flow and easy flushing noted in all ports.  The catheter was sutured in place and sterile dressing applied.  Complications/Tolerance None; patient tolerated the procedure well. Chest X-ray is ordered to verify placement for internal jugular or subclavian cannulation.   Chest x-ray is not ordered for femoral cannulation.  EBL Minimal  Specimen(s) None  Christy Curry, AGNP  Pulmonary/Critical Care Pager [phone] (please enter 7 digits) PCCM Consult Pager [phone] (please enter 7 digits)

## 2023-07-06 ENCOUNTER — Inpatient Hospital Stay
Admit: 2023-07-06 | Discharge: 2023-07-06 | Disposition: A | Discharge status: Home / Self Care | Payer: Medicare PPO | Attending: Nurse Practitioner | Admitting: Nurse Practitioner

## 2023-07-06 ENCOUNTER — Inpatient Hospital Stay: Payer: Medicare PPO

## 2023-07-06 DIAGNOSIS — I361 Nonrheumatic tricuspid (valve) insufficiency: Secondary | ICD-10-CM | POA: Diagnosis not present

## 2023-07-06 DIAGNOSIS — I340 Nonrheumatic mitral (valve) insufficiency: Secondary | ICD-10-CM | POA: Diagnosis not present

## 2023-07-06 DIAGNOSIS — T17908A Unspecified foreign body in respiratory tract, part unspecified causing other injury, initial encounter: Secondary | ICD-10-CM

## 2023-07-06 DIAGNOSIS — R918 Other nonspecific abnormal finding of lung field: Secondary | ICD-10-CM | POA: Diagnosis not present

## 2023-07-06 LAB — CBC
HCT: 24.2 % — ABNORMAL LOW (ref 36.0–46.0)
Hemoglobin: 7.8 g/dL — ABNORMAL LOW (ref 12.0–15.0)
MCH: 31.2 pg (ref 26.0–34.0)
MCHC: 32.2 g/dL (ref 30.0–36.0)
MCV: 96.8 fL (ref 80.0–100.0)
Platelets: 212 10*3/uL (ref 150–400)
RBC: 2.5 MIL/uL — ABNORMAL LOW (ref 3.87–5.11)
RDW: 15.3 % (ref 11.5–15.5)
WBC: 12.3 10*3/uL — ABNORMAL HIGH (ref 4.0–10.5)
nRBC: 0 % (ref 0.0–0.2)

## 2023-07-06 LAB — CBC WITH DIFFERENTIAL/PLATELET
Abs Immature Granulocytes: 0.09 10*3/uL — ABNORMAL HIGH (ref 0.00–0.07)
Basophils Absolute: 0 10*3/uL (ref 0.0–0.1)
Basophils Relative: 0 %
Eosinophils Absolute: 0 10*3/uL (ref 0.0–0.5)
Eosinophils Relative: 0 %
HCT: 24.5 % — ABNORMAL LOW (ref 36.0–46.0)
Hemoglobin: 7.9 g/dL — ABNORMAL LOW (ref 12.0–15.0)
Immature Granulocytes: 1 %
Lymphocytes Relative: 6 %
Lymphs Abs: 0.8 10*3/uL (ref 0.7–4.0)
MCH: 31.3 pg (ref 26.0–34.0)
MCHC: 32.2 g/dL (ref 30.0–36.0)
MCV: 97.2 fL (ref 80.0–100.0)
Monocytes Absolute: 0.7 10*3/uL (ref 0.1–1.0)
Monocytes Relative: 5 %
Neutro Abs: 11.6 10*3/uL — ABNORMAL HIGH (ref 1.7–7.7)
Neutrophils Relative %: 88 %
Platelets: 204 10*3/uL (ref 150–400)
RBC: 2.52 MIL/uL — ABNORMAL LOW (ref 3.87–5.11)
RDW: 15.3 % (ref 11.5–15.5)
WBC: 13.2 10*3/uL — ABNORMAL HIGH (ref 4.0–10.5)
nRBC: 0 % (ref 0.0–0.2)

## 2023-07-06 LAB — GLUCOSE, CAPILLARY
Glucose-Capillary: 122 mg/dL — ABNORMAL HIGH (ref 70–99)
Glucose-Capillary: 141 mg/dL — ABNORMAL HIGH (ref 70–99)
Glucose-Capillary: 154 mg/dL — ABNORMAL HIGH (ref 70–99)
Glucose-Capillary: 178 mg/dL — ABNORMAL HIGH (ref 70–99)
Glucose-Capillary: 184 mg/dL — ABNORMAL HIGH (ref 70–99)
Glucose-Capillary: 231 mg/dL — ABNORMAL HIGH (ref 70–99)

## 2023-07-06 LAB — RENAL FUNCTION PANEL
Albumin: 2.7 g/dL — ABNORMAL LOW (ref 3.5–5.0)
Anion gap: 14 (ref 5–15)
BUN: 47 mg/dL — ABNORMAL HIGH (ref 8–23)
CO2: 23 mmol/L (ref 22–32)
Calcium: 8 mg/dL — ABNORMAL LOW (ref 8.9–10.3)
Chloride: 104 mmol/L (ref 98–111)
Creatinine, Ser: 9.09 mg/dL — ABNORMAL HIGH (ref 0.44–1.00)
GFR, Estimated: 4 mL/min — ABNORMAL LOW (ref >60)
Glucose, Bld: 150 mg/dL — ABNORMAL HIGH (ref 70–99)
Phosphorus: 6.7 mg/dL — ABNORMAL HIGH (ref 2.5–4.6)
Potassium: 4.1 mmol/L (ref 3.5–5.1)
Sodium: 141 mmol/L (ref 135–145)

## 2023-07-06 LAB — BLOOD GAS, ARTERIAL
Acid-base deficit: 0.2 mmol/L (ref 0.0–2.0)
Bicarbonate: 25.4 mmol/L (ref 20.0–28.0)
FIO2: 70 %
MECHVT: 370 mL
Mechanical Rate: 18
O2 Saturation: 96.5 %
PEEP: 8 cmH2O
Patient temperature: 37
pCO2 arterial: 44 mm[Hg] (ref 32–48)
pH, Arterial: 7.37 (ref 7.35–7.45)
pO2, Arterial: 146 mm[Hg] — ABNORMAL HIGH (ref 83–108)

## 2023-07-06 LAB — ECHOCARDIOGRAM COMPLETE
AR max vel: 0.68 cm2
AV Area VTI: 0.7 cm2
AV Area mean vel: 0.62 cm2
AV Mean grad: 47 mm[Hg]
AV Peak grad: 60.8 mm[Hg]
Ao pk vel: 3.9 m/s
Area-P 1/2: 5.62 cm2
Height: 61 in
MV M vel: 4.72 m/s
MV Peak grad: 89.1 mm[Hg]
S' Lateral: 3.2 cm
Weight: 2239.87 [oz_av]

## 2023-07-06 LAB — MAGNESIUM: Magnesium: 2.2 mg/dL (ref 1.7–2.4)

## 2023-07-06 LAB — HEPARIN LEVEL (UNFRACTIONATED)
Heparin Unfractionated: 0.51 [IU]/mL (ref 0.30–0.70)
Heparin Unfractionated: 0.76 [IU]/mL — ABNORMAL HIGH (ref 0.30–0.70)
Heparin Unfractionated: 0.92 [IU]/mL — ABNORMAL HIGH (ref 0.30–0.70)
Heparin Unfractionated: >1.1 [IU]/mL — ABNORMAL HIGH (ref 0.30–0.70)

## 2023-07-06 MED ORDER — FENTANYL CITRATE (PF) 100 MCG/2ML IJ SOLN: 100.0000 ug | Freq: Once | INTRAMUSCULAR | Status: AC

## 2023-07-06 MED ORDER — PROPOFOL 1000 MG/100ML IV EMUL
0.0000 ug/kg/min | INTRAVENOUS | Status: DC
  Administered 2023-07-06: 5 ug/kg/min via INTRAVENOUS
  Administered 2023-07-07: 25 ug/kg/min via INTRAVENOUS
  Filled 2023-07-06 (×2): qty 100

## 2023-07-06 MED ORDER — NOREPINEPHRINE 4 MG/250ML-% IV SOLN: 0.0000 ug/min | INTRAVENOUS | Status: DC

## 2023-07-06 MED ORDER — IPRATROPIUM-ALBUTEROL 0.5-2.5 (3) MG/3ML IN SOLN
3.0000 mL | Freq: Four times a day (QID) | RESPIRATORY_TRACT | Status: DC
  Administered 2023-07-06 – 2023-07-07 (×5): 3 mL via RESPIRATORY_TRACT
  Filled 2023-07-06 (×5): qty 3

## 2023-07-06 MED ORDER — ROCURONIUM BROMIDE 10 MG/ML (PF) SYRINGE: 1.0000 mg/kg | PREFILLED_SYRINGE | Freq: Once | INTRAVENOUS | Status: AC

## 2023-07-06 MED ORDER — ORAL CARE MOUTH RINSE
15.0000 mL | OROMUCOSAL | Status: DC
  Administered 2023-07-06 – 2023-07-07 (×10): 15 mL via OROMUCOSAL

## 2023-07-06 MED ORDER — SODIUM CHLORIDE 0.9 % IV SOLN
12.5000 mg | Freq: Four times a day (QID) | INTRAVENOUS | Status: DC | PRN
  Administered 2023-07-08: 12.5 mg via INTRAVENOUS
  Filled 2023-07-06: qty 0.5
  Filled 2023-07-06: qty 12.5

## 2023-07-06 MED ORDER — FENTANYL CITRATE (PF) 100 MCG/2ML IJ SOLN
INTRAMUSCULAR | Status: AC
  Administered 2023-07-06: 100 ug via INTRAVENOUS
  Filled 2023-07-06: qty 2

## 2023-07-06 MED ORDER — IOHEXOL 300 MG/ML  SOLN
100.0000 mL | Freq: Once | INTRAMUSCULAR | Status: AC | PRN
  Administered 2023-07-06: 100 mL via INTRAVENOUS

## 2023-07-06 MED ORDER — MIDAZOLAM HCL 2 MG/2ML IJ SOLN
INTRAMUSCULAR | Status: AC
  Administered 2023-07-06: 4 mg via INTRAVENOUS
  Filled 2023-07-06: qty 4

## 2023-07-06 MED ORDER — SODIUM CHLORIDE 0.9 % IV SOLN
12.5000 mg | Freq: Once | INTRAVENOUS | Status: AC
  Administered 2023-07-06: 12.5 mg via INTRAVENOUS
  Filled 2023-07-06: qty 12.5

## 2023-07-06 MED ORDER — FENTANYL CITRATE PF 50 MCG/ML IJ SOSY: 50.0000 ug | PREFILLED_SYRINGE | Freq: Once | INTRAMUSCULAR | Status: DC

## 2023-07-06 MED ORDER — LABETALOL HCL 5 MG/ML IV SOLN
10.0000 mg | INTRAVENOUS | Status: DC | PRN
  Administered 2023-07-07 – 2023-07-08 (×6): 10 mg via INTRAVENOUS
  Filled 2023-07-06 (×5): qty 4

## 2023-07-06 MED ORDER — DOCUSATE SODIUM 50 MG/5ML PO LIQD
100.0000 mg | Freq: Two times a day (BID) | ORAL | Status: DC
  Administered 2023-07-06 – 2023-07-07 (×2): 100 mg
  Filled 2023-07-06 (×2): qty 10

## 2023-07-06 MED ORDER — POLYETHYLENE GLYCOL 3350 17 G PO PACK
17.0000 g | PACK | Freq: Every day | ORAL | Status: DC
  Administered 2023-07-07: 17 g
  Filled 2023-07-06: qty 1

## 2023-07-06 MED ORDER — ETOMIDATE 2 MG/ML IV SOLN: 20.0000 mg | Freq: Once | INTRAVENOUS | Status: AC

## 2023-07-06 MED ORDER — FENTANYL BOLUS VIA INFUSION: 50.0000 ug | INTRAVENOUS | Status: DC | PRN

## 2023-07-06 MED ORDER — NOREPINEPHRINE 4 MG/250ML-% IV SOLN
INTRAVENOUS | Status: AC
  Filled 2023-07-06: qty 250

## 2023-07-06 MED ORDER — ETOMIDATE 2 MG/ML IV SOLN
INTRAVENOUS | Status: AC
  Administered 2023-07-06: 20 mg via INTRAVENOUS
  Filled 2023-07-06: qty 10

## 2023-07-06 MED ORDER — MIDAZOLAM HCL 2 MG/2ML IJ SOLN: 4.0000 mg | Freq: Once | INTRAMUSCULAR | Status: AC

## 2023-07-06 MED ORDER — FENTANYL 2500MCG IN NS 250ML (10MCG/ML) PREMIX INFUSION
50.0000 ug/h | INTRAVENOUS | Status: DC
Start: 2023-07-06 — End: 2023-07-07
  Administered 2023-07-06: 50 ug/h via INTRAVENOUS
  Filled 2023-07-06: qty 250

## 2023-07-06 MED ORDER — ROCURONIUM BROMIDE 10 MG/ML (PF) SYRINGE
PREFILLED_SYRINGE | INTRAVENOUS | Status: AC
  Administered 2023-07-06: 63.5 mg via INTRAVENOUS
  Filled 2023-07-06: qty 10

## 2023-07-06 NOTE — Progress Notes (Signed)
 ANTICOAGULATION CONSULT NOTE  Pharmacy Consult for heparin  infusion Indication: VTE Tx  Allergies  Allergen Reactions   2,4-D Dimethylamine Swelling   Other Swelling   Tetanus Antitoxin Itching   Tetanus Toxoid    Tetanus Toxoids Itching    Patient Measurements: Height: 5' 1 (154.9 cm) Weight: 63.5 kg (139 lb 15.9 oz) IBW/kg (Calculated) : 47.8 Heparin  Dosing Weight: 60.8 kg  Vital Signs: Temp: 98.4 F (36.9 C) (01/14 1221) Temp Source: Axillary (01/14 1221) BP: 102/51 (01/14 1221) Pulse Rate: 61 (01/14 1200)  Labs: Recent Labs    07/04/23 1348 07/04/23 1541 07/05/23 0403 07/05/23 0754 07/05/23 1422 07/05/23 1422 07/05/23 2339 07/06/23 0051 07/06/23 0403 07/06/23 0846  HGB 11.9*  --  10.0*  --   --   --   --   --  7.9*  --   HCT 40.5  --  31.6*  --   --   --   --   --  24.5*  --   PLT 295  --  264  --   --   --   --   --  204  --   APTT  --  25  --   --   --   --   --   --   --   --   LABPROT  --  15.3*  --   --   --   --   --   --   --   --   INR  --  1.2  --   --   --   --   --   --   --   --   HEPARINUNFRC  --   --   --   --  0.29*   < > >1.10* 0.92*  --  0.76*  CREATININE 7.07*  --  8.01*  --   --   --   --   --  9.09*  --   TROPONINIHS 100* 169* 795* 727* 553*  --   --   --   --   --    < > = values in this interval not displayed.    Estimated Creatinine Clearance: 5.1 mL/min (A) (by C-G formula based on SCr of 9.09 mg/dL (H)).   Medical History: Past Medical History:  Diagnosis Date   Diabetes mellitus without complication (HCC)    Hypertension    Renal disorder     Assessment: Pt is a 68 yo female presenting to ED c/o SOB, found with acute symptomatic nonocclusive RUExt DVT.  Goal of Therapy:  Heparin  level 0.3-0.7 units/ml Monitor platelets by anticoagulation protocol: Yes  Date/Time: HL: Comment/Rate: 1/14@0051  0.92 SUPRAtherapeutic@1100  units/hr 1/14@0846  0.76 SUPRAtherapeutic@1000  units/hr   Plan:  Decrease heparin  drip to 900  units/hr. Will recheck HL in 8 hr after rate change CBC daily while on heparin   Vennela Jutte A Chayah Mckee, PharmD Clinical Pharmacist 07/06/2023 12:46 PM

## 2023-07-06 NOTE — TOC CM/SW Note (Signed)
 Transition of Care [facility]) - Inpatient Brief Assessment   Patient Details  Name: Christy Curry MRN: [id number] Date of Birth: [birth year]  Transition of [facility]) CM/SW Contact:    Corean ONEIDA Haddock, RN Phone Number: 07/06/2023, 9:08 AM   Clinical Narrative:  Patient with high risk for readmission score Patient currently intubated on vent.  TOC to follow up with readmission assessment when appropriate   Transition of Care Asessment: Insurance and Status: Insurance coverage has been reviewed Patient has primary care physician: Yes     Prior/Current Home Services: No current home services Social Drivers of Health Review: SDOH reviewed no interventions necessary   Transition of care needs: transition of care needs identified, TOC will continue to follow

## 2023-07-06 NOTE — Progress Notes (Signed)
 Pt had hypotension 86/60 mmhg during tx.UF off 500 ml.  07/06/23 1221  Vitals  Temp 98.4 F (36.9 C)  Temp Source Axillary  BP (!) 102/51  MAP (mmHg) (!) 60  Oxygen Therapy  O2 Device Ventilator  During Treatment Monitoring  Intra-Hemodialysis Comments Tx completed  Post Treatment  Dialyzer Clearance Lightly streaked  Hemodialysis Intake (mL) 0 mL  Liters Processed 72  Fluid Removed (mL) 500 mL  Tolerated HD Treatment Yes  Post-Hemodialysis Comments Pt goal met.  CVC Triple Lumen 07/05/23 Right Femoral  Placement Date/Time: 07/05/23 (c) 1410   Inserted By: Physician  Inserted by (if other than self): Lonell Moose NP  Orientation: Right  Location: Femoral  Site Assessment Clean, Dry, Intact;Clean;Dry  Dressing Type Transparent  Dressing Status Antimicrobial disc/dressing in place;Clean, Dry, Intact  Dressing Change Due 07/08/23

## 2023-07-06 NOTE — Procedures (Signed)
 Extubation Procedure Note  Patient Details:   Name: Christy Curry DOB: 1955/07/18 MRN: [id number]   Airway Documentation:    Vent end date: 07/06/23 Vent end time: 1453   Evaluation  O2 sats: stable throughout Complications: No apparent complications Patient did tolerate procedure well. Bilateral Breath Sounds: Clear, Diminished   Yes able to cough and speak  [name] 07/06/2023, 2:54 PM

## 2023-07-06 NOTE — Progress Notes (Signed)
 Echocardiogram 2D Echocardiogram has been performed.  Christy Curry 07/06/2023, 5:48 PM

## 2023-07-06 NOTE — Progress Notes (Signed)
 [facility]  ROUNDING NOTE   Subjective:   Patient remains on the ventilator. We had planned to perform dialysis treatment yesterday unfortunately this could not be done given high patient volume and nursing shortage. We are planning for dialysis treatment today.  Objective:  Vital signs in last 24 hours:  Temp:  [97.5 F (36.4 C)-101.5 F (38.6 C)] 97.6 F (36.4 C) (01/14 0400) Pulse Rate:  [59-106] 82 (01/14 0700) Resp:  [13-29] 22 (01/14 0700) BP: (96-154)/(36-77) 145/58 (01/14 0700) SpO2:  [92 %-100 %] 98 % (01/14 0732) FiO2 (%):  [30 %-50 %] 30 % (01/14 0732) Weight:  [62.8 kg] 62.8 kg (01/14 0330)  Weight change: 2.018 kg Filed Weights   07/04/23 1350 07/06/23 0330  Weight: 60.8 kg 62.8 kg    Intake/Output: I/O last 3 completed shifts: In: 1634.6 [I.V.:782.3; NG/GT:377.5; IV Piggyback:474.8] Out: 235 [Urine:185; Emesis/NG output:50]   Intake/Output this shift:  No intake/output data recorded.  Physical Exam: General: Critically ill-appearing  Head: Normocephalic, atraumatic.  Endotracheal tube in place  Neck: Supple  Lungs:  Coarse breath sounds bilateral, vent assisted  Heart: S1S2 no rubs  Abdomen:  Soft, nontender, bowel sounds present  Extremities: Trace peripheral edema.  Neurologic: Intubated but arousable  Skin: No acute rash  Access: Left upper extremity AV graft, right femoral temporary dialysis catheter    Basic Metabolic Panel: Recent Labs  Lab 07/04/23 1348 07/05/23 0403 07/06/23 0403  NA 140 144 141  K 3.8 3.7 4.1  CL 106 104 104  CO2 19* 23 23  GLUCOSE 171* 188* 150*  BUN 28* 35* 47*  CREATININE 7.07* 8.01* 9.09*  CALCIUM  9.1 8.8* 8.0*  MG 2.5*  --  2.2  PHOS  --   --  6.7*    Liver Function Tests: Recent Labs  Lab 07/04/23 1348 07/06/23 0403  AST 23  --   ALT 13  --   ALKPHOS 81  --   BILITOT 0.7  --   PROT 7.2  --   ALBUMIN 3.9 2.7*   Recent Labs  Lab 07/04/23 1348  LIPASE 21   No results for  input(s): AMMONIA in the last 168 hours.  CBC: Recent Labs  Lab 07/04/23 1348 07/05/23 0403 07/06/23 0403  WBC 21.6* 13.5* 13.2*  NEUTROABS 18.5*  --  11.6*  HGB 11.9* 10.0* 7.9*  HCT 40.5 31.6* 24.5*  MCV 106.9* 97.5 97.2  PLT 295 264 204    Cardiac Enzymes: No results for input(s): CKTOTAL, CKMB, CKMBINDEX, TROPONINI in the last 168 hours.  BNP: Invalid input(s): POCBNP  CBG: Recent Labs  Lab 07/05/23 1645 07/05/23 1938 07/05/23 2313 07/06/23 0335 07/06/23 0742  GLUCAP 179* 167* 200* 141* 231*    Microbiology: Results for orders placed or performed during the hospital encounter of 07/04/23  Resp panel by RT-PCR (RSV, Flu A&B, Covid) Anterior Nasal Swab     Status: Abnormal   Collection Time: 07/04/23  1:48 PM   Specimen: Anterior Nasal Swab  Result Value Ref Range Status   SARS Coronavirus 2 by RT PCR NEGATIVE NEGATIVE Final    Comment: (NOTE) SARS-CoV-2 target nucleic acids are NOT DETECTED.  The SARS-CoV-2 RNA is generally detectable in upper respiratory specimens during the acute phase of infection. The lowest concentration of SARS-CoV-2 viral copies this assay can detect is 138 copies/mL. A negative result does not preclude SARS-Cov-2 infection and should not be used as the sole basis for treatment or other patient management decisions. A negative result may  occur with  improper specimen collection/handling, submission of specimen other than nasopharyngeal swab, presence of viral mutation(s) within the areas targeted by this assay, and inadequate number of viral copies(<138 copies/mL). A negative result must be combined with clinical observations, patient history, and epidemiological information. The expected result is Negative.  Fact Sheet for Patients:  bloggercourse.com  Fact Sheet for Healthcare Providers:  seriousbroker.it  This test is no t yet approved or cleared by the [place]  [place] FDA and  has been authorized for detection and/or diagnosis of SARS-CoV-2 by FDA under an Emergency Use Authorization (EUA). This EUA will remain  in effect (meaning this test can be used) for the duration of the COVID-19 declaration under Section 564(b)(1) of the Act, 21 U.S.C.section 360bbb-3(b)(1), unless the authorization is terminated  or revoked sooner.       Influenza A by PCR NEGATIVE NEGATIVE Final   Influenza B by PCR NEGATIVE NEGATIVE Final    Comment: (NOTE) The Xpert Xpress SARS-CoV-2/FLU/RSV plus assay is intended as an aid in the diagnosis of influenza from Nasopharyngeal swab specimens and should not be used as a sole basis for treatment. Nasal washings and aspirates are unacceptable for Xpert Xpress SARS-CoV-2/FLU/RSV testing.  Fact Sheet for Patients: bloggercourse.com  Fact Sheet for Healthcare Providers: seriousbroker.it  This test is not yet approved or cleared by the [place]  FDA and has been authorized for detection and/or diagnosis of SARS-CoV-2 by FDA under an Emergency Use Authorization (EUA). This EUA will remain in effect (meaning this test can be used) for the duration of the COVID-19 declaration under Section 564(b)(1) of the Act, 21 U.S.C. section 360bbb-3(b)(1), unless the authorization is terminated or revoked.     Resp Syncytial Virus by PCR POSITIVE (A) NEGATIVE Final    Comment: (NOTE) Fact Sheet for Patients: bloggercourse.com  Fact Sheet for Healthcare Providers: seriousbroker.it  This test is not yet approved or cleared by the [place]  FDA and has been authorized for detection and/or diagnosis of SARS-CoV-2 by FDA under an Emergency Use Authorization (EUA). This EUA will remain in effect (meaning this test can be used) for the duration of the COVID-19 declaration under Section 564(b)(1) of the Act, 21 U.S.C. section  360bbb-3(b)(1), unless the authorization is terminated or revoked.  Performed at [facility], [address] [place], [place] [address]   Blood Culture (routine x 2)     Status: None (Preliminary result)   Collection Time: 07/04/23  3:41 PM   Specimen: BLOOD  Result Value Ref Range Status   Specimen Description BLOOD BLOOD RIGHT WRIST  Final   Special Requests   Final    BOTTLES DRAWN AEROBIC AND ANAEROBIC Blood Culture adequate volume   Culture   Final    NO GROWTH < 12 HOURS Performed at [facility], [address], [place], [place] [address]    Report Status PENDING  Incomplete  Blood Culture (routine x 2)     Status: None (Preliminary result)   Collection Time: 07/04/23  3:41 PM   Specimen: BLOOD  Result Value Ref Range Status   Specimen Description BLOOD BLOOD RIGHT HAND  Final   Special Requests   Final    BOTTLES DRAWN AEROBIC AND ANAEROBIC Blood Culture adequate volume   Culture   Final    NO GROWTH < 12 HOURS Performed at [facility], [address], [place], [place] [address]    Report Status PENDING  Incomplete  MRSA Next Gen by PCR, Nasal  Status: None   Collection Time: 07/04/23  6:20 PM   Specimen: Nasal Mucosa; Nasal Swab  Result Value Ref Range Status   MRSA by PCR Next Gen NOT DETECTED NOT DETECTED Final    Comment: (NOTE) The GeneXpert MRSA Assay (FDA approved for NASAL specimens only), is one component of a comprehensive MRSA colonization surveillance program. It is not intended to diagnose MRSA infection nor to guide or monitor treatment for MRSA infections. Test performance is not FDA approved in patients less than 49 years old. Performed at [facility], [address] [place], [place] [address]   Culture, Respiratory w Gram Stain     Status: None (Preliminary result)   Collection Time: 07/05/23  8:07 AM   Specimen: Tracheal Aspirate; Respiratory  Result Value Ref Range Status   Specimen Description    Final    TRACHEAL ASPIRATE Performed at [facility], [address], [place], [place] [address]    Special Requests   Final    NONE Performed at [facility], [address] [place], [place] [address]    Gram Stain   Final    MODERATE WBC PRESENT, PREDOMINANTLY PMN RARE GRAM POSITIVE COCCI IN PAIRS Performed at [facility], [address] [place], [place] [address]    Culture PENDING  Incomplete   Report Status PENDING  Incomplete  Culture, Respiratory w Gram Stain     Status: None (Preliminary result)   Collection Time: 07/05/23  3:26 PM   Specimen: Bronchoalveolar Lavage; Respiratory  Result Value Ref Range Status   Specimen Description   Final    BRONCHIAL ALVEOLAR LAVAGE Performed at [facility], [address], [place], [place] [address]    Special Requests   Final    NONE Performed at [facility], [address] [place], [place] [address]    Gram Stain   Final    FEW WBC PRESENT, PREDOMINANTLY PMN NO ORGANISMS SEEN Performed at [facility], [address], [place], [place] [address]    Culture PENDING  Incomplete   Report Status PENDING  Incomplete    Coagulation Studies: Recent Labs    07/04/23 1541  LABPROT 15.3*  INR 1.2    Urinalysis: Recent Labs    07/04/23 2334  COLORURINE YELLOW*  LABSPEC 1.012  PHURINE 8.0  GLUCOSEU >=500*  HGBUR NEGATIVE  BILIRUBINUR NEGATIVE  KETONESUR NEGATIVE  PROTEINUR >=300*  NITRITE NEGATIVE  LEUKOCYTESUR NEGATIVE      Imaging: DG Chest 1 View Result Date: 07/05/2023 CLINICAL DATA:  NG placement. EXAM: CHEST  1 VIEW; ABDOMEN - 1 VIEW COMPARISON:  Chest radiograph dated 05/03/2024. FINDINGS: Endotracheal tube in stable position. Similar positioning of the enteric tube with tip in the region of the gastric fundus. Right mid to lower lung field airspace opacity as well as left lung infiltrate. No pneumothorax. Stable cardiomediastinal silhouette. No bowel  dilatation or evidence of obstruction. No free air. Bilateral tubal ligation clips. No acute osseous pathology. IMPRESSION: 1. Enteric tube with tip in the region of the gastric fundus. 2. Bilateral lung infiltrates. Electronically Signed   By: Vanetta Chou M.D.   On: 07/05/2023 12:21   DG Abd 1 View Result Date: 07/05/2023 CLINICAL DATA:  NG placement. EXAM: CHEST  1 VIEW; ABDOMEN - 1 VIEW COMPARISON:  Chest radiograph dated 05/03/2024. FINDINGS: Endotracheal tube in stable position. Similar positioning of the enteric tube with tip in the region of the gastric fundus. Right mid to lower  lung field airspace opacity as well as left lung infiltrate. No pneumothorax. Stable cardiomediastinal silhouette. No bowel dilatation or evidence of obstruction. No free air. Bilateral tubal ligation clips. No acute osseous pathology. IMPRESSION: 1. Enteric tube with tip in the region of the gastric fundus. 2. Bilateral lung infiltrates. Electronically Signed   By: Vanetta Chou M.D.   On: 07/05/2023 12:21   US  EKG SITE RITE Result Date: 07/05/2023 If Site Rite image not attached, placement could not be confirmed due to current cardiac rhythm.  DG Abd 1 View Result Date: 07/05/2023 CLINICAL DATA:  NG tube placement EXAM: ABDOMEN - 1 VIEW COMPARISON:  None Available. FINDINGS: Enteric tube terminates in the gastric cardia. IMPRESSION: Enteric tube terminates in the gastric cardia. Electronically Signed   By: Pinkie Pebbles M.D.   On: 07/05/2023 00:04   DG Chest Port 1 View Result Date: 07/05/2023 CLINICAL DATA:  Post intubation EXAM: PORTABLE CHEST 1 VIEW COMPARISON:  07/04/2023 at 1354 hours FINDINGS: Endotracheal tube terminates 2.9 cm above the carina. Progressive patchy airspace opacity in the right upper/mid lung, suggesting infection/pneumonia. Underlying multifocal patchy opacities, likely reflecting multifocal pneumonia, less likely underlying interstitial edema. Possible small right pleural effusion.   No pneumothorax. The heart is normal in size.  Left axillary vascular stent. Enteric tube terminates in the proximal stomach. IMPRESSION: Endotracheal tube terminates 2.9 cm above the carina. Progressive right lung pneumonia.  Small right pleural effusion. Underlying multifocal pneumonia, less likely interstitial edema. Electronically Signed   By: Pinkie Pebbles M.D.   On: 07/05/2023 00:04   US  Venous Img Upper Uni Right(DVT) Result Date: 07/04/2023 CLINICAL DATA:  Right arm pain EXAM: RIGHT UPPER EXTREMITY VENOUS DOPPLER ULTRASOUND TECHNIQUE: Gray-scale sonography with graded compression, as well as color Doppler and duplex ultrasound were performed to evaluate the upper extremity deep venous system from the level of the subclavian vein and including the jugular, axillary, basilic, radial, ulnar and upper cephalic vein. Spectral Doppler was utilized to evaluate flow at rest and with distal augmentation maneuvers. COMPARISON:  None Available. FINDINGS: Contralateral Subclavian Vein: Respiratory phasicity is normal and symmetric with the symptomatic side. No evidence of thrombus. Normal compressibility. Internal Jugular Vein: No evidence of thrombus. Normal compressibility, respiratory phasicity and response to augmentation. Subclavian Vein: No evidence of thrombus. Normal compressibility, respiratory phasicity and response to augmentation. Axillary Vein: No evidence of thrombus. Normal compressibility, respiratory phasicity and response to augmentation. Cephalic Vein: No evidence of thrombus. Normal compressibility, respiratory phasicity and response to augmentation. Basilic Vein: Nonocclusive thrombus in the distal basilic vein (superficial venous thrombosis). Brachial Veins: No evidence of thrombus. Normal compressibility, respiratory phasicity and response to augmentation. Radial Veins: No evidence of thrombus. Normal compressibility, respiratory phasicity and response to augmentation. Ulnar Veins: No  evidence of thrombus. Normal compressibility, respiratory phasicity and response to augmentation. Venous Reflux:  None visualized. Other Findings:  None visualized. IMPRESSION: 1. Nonocclusive superficial venous thrombosis in the right basilic vein. Otherwise unremarkable. Electronically Signed   By: Ryan Salvage M.D.   On: 07/04/2023 20:00   DG Chest Portable 1 View Result Date: 07/04/2023 CLINICAL DATA:  Shortness of breath. EXAM: PORTABLE CHEST 1 VIEW COMPARISON:  Chest x-ray dated May 26, 2023. FINDINGS: Borderline cardiomegaly. Asymmetrically increased interstitial thickening in the right lung. Hazy airspace disease in the right mid lung and left lower lung. Suspected small bilateral pleural effusions. No pneumothorax. No acute osseous abnormality. Left axillary stents noted. IMPRESSION: 1. Asymmetric interstitial thickening in the right lung with hazy airspace  disease in the right mid lung and left lower lung, concerning for moderate pulmonary edema versus multifocal pneumonia. 2. Suspected small bilateral pleural effusions. Electronically Signed   By: Elsie ONEIDA Shoulder M.D.   On: 07/04/2023 14:09     Medications:    azithromycin  Stopped (07/05/23 1625)   cefTRIAXone  (ROCEPHIN )  IV Stopped (07/05/23 1516)   feeding supplement (VITAL AF 1.2 CAL) 25 mL/hr at 07/06/23 0600   fentaNYL  infusion INTRAVENOUS 50 mcg/hr (07/06/23 0600)   heparin  1,000 Units/hr (07/06/23 0600)   propofol  (DIPRIVAN ) infusion 40 mcg/kg/min (07/06/23 0600)    aspirin  EC  81 mg Oral Daily   atorvastatin   80 mg Per Tube QHS   carvedilol   25 mg Per Tube BID WC   Chlorhexidine  Gluconate Cloth  6 each Topical Daily   cloNIDine   0.2 mg Per Tube BID   docusate  100 mg Per Tube BID   ezetimibe   10 mg Per Tube Daily   feeding supplement (PROSource TF20)  60 mL Per Tube Daily   fentaNYL  (SUBLIMAZE ) injection  25 mcg Intravenous Once   free water   30 mL Per Tube Q4H   insulin  aspart  0-6 Units Subcutaneous Q4H    multivitamin  1 tablet Per Tube QHS   mouth rinse  15 mL Mouth Rinse Q2H   pantoprazole  (PROTONIX ) IV  40 mg Intravenous Daily   polyethylene glycol  17 g Per Tube Daily   predniSONE   40 mg Per Tube Q breakfast   sevelamer  carbonate  0.8 g Per Tube TID WC   acetaminophen  **OR** acetaminophen , fentaNYL , ondansetron  **OR** ondansetron  (ZOFRAN ) IV, mouth rinse  Assessment/ Plan:  68 y.o. female with past medical history of diabetes mellitus type 2, peripheral neuropathy, GERD, hypertension, aortic stenosis, anemia of chronic kidney disease, peripheral arterial disease, ESRD on HD MWF who presented with shortness of breath and found to have RSV infection.  [facility]/MWF/[facility] [address]/left upper extremity AV graft  1.  ESRD on HD MWF.  Unfortunately dialysis could not be performed yesterday due to high patient volume and nursing shortage.  Hemodialysis access appears to be clotted.  Temporary right femoral dialysis catheter placed.  Will plan for hemodialysis treatment today.  2.  Acute respiratory failure secondary to RSV infection.  Continue ventilatory support as well as ceftriaxone  and azithromycin .  3.  Anemia of chronic kidney disease.  Lab Results  Component Value Date   HGB 7.9 (L) 07/06/2023   On Mircera as outpatient.  Hold off on adding this at the moment.  Follow CBC.  No immediate need for blood transfusion.  4.  Secondary hyperparathyroidism.  Maintain the patient on Renvela  0.8 g 3 times daily.   LOS: 2 Willisha Sligar 1/14/20257:49 AM

## 2023-07-06 NOTE — Procedures (Signed)
 Intubation Procedure Note  Christy Curry  [id number]  June 12, 1956  Date:07/06/23  Time:9:58 PM   Provider Performing:Christy Curry    Procedure: Intubation (31500)  Indication(s) Respiratory Failure  Consent Risks of the procedure as well as the alternatives and risks of each were explained to the patient and/or caregiver.  Consent for the procedure was obtained and is signed in the bedside chart   Anesthesia Etomidate , Versed , Fentanyl , and Rocuronium    Time Out Verified patient identification, verified procedure, site/side was marked, verified correct patient position, special equipment/implants available, medications/allergies/relevant history reviewed, required imaging and test results available.   Sterile Technique Usual hand hygeine, masks, and gloves were used   Procedure Description Patient positioned in bed supine.  Sedation given as noted above.  Patient was intubated with endotracheal tube using Glidescope.  View was Grade 1 full glottis .  Number of attempts was 1.  Colorimetric CO2 detector was consistent with tracheal placement.   Complications/Tolerance None; patient tolerated the procedure well. Chest X-ray is ordered to verify placement.   EBL Minimal   Specimen(s) None  Christy Curry, AGACNP-BC Acute Care Nurse Practitioner [facility]   [phone] / [phone] Please see Amion for details.

## 2023-07-06 NOTE — Progress Notes (Signed)
 ANTICOAGULATION CONSULT NOTE  Pharmacy Consult for heparin  infusion Indication: VTE Tx  Allergies  Allergen Reactions   2,4-D Dimethylamine Swelling   Other Swelling   Tetanus Antitoxin Itching   Tetanus Toxoid    Tetanus Toxoids Itching    Patient Measurements: Height: 5' 1 (154.9 cm) Weight: 63.5 kg (139 lb 15.9 oz) IBW/kg (Calculated) : 47.8 Heparin  Dosing Weight: 60.8 kg  Vital Signs: Temp: 99 F (37.2 C) (01/14 2000) Temp Source: Axillary (01/14 2000) BP: 155/65 (01/14 2000) Pulse Rate: 98 (01/14 2000)  Labs: Recent Labs    07/04/23 1348 07/04/23 1541 07/05/23 0403 07/05/23 0754 07/05/23 1422 07/05/23 2339 07/06/23 0051 07/06/23 0403 07/06/23 0846 07/06/23 1302 07/06/23 2026  HGB 11.9*  --  10.0*  --   --   --   --  7.9*  --  7.8*  --   HCT 40.5  --  31.6*  --   --   --   --  24.5*  --  24.2*  --   PLT 295  --  264  --   --   --   --  204  --  212  --   APTT  --  25  --   --   --   --   --   --   --   --   --   LABPROT  --  15.3*  --   --   --   --   --   --   --   --   --   INR  --  1.2  --   --   --   --   --   --   --   --   --   HEPARINUNFRC  --   --   --   --  0.29*   < > 0.92*  --  0.76*  --  0.51  CREATININE 7.07*  --  8.01*  --   --   --   --  9.09*  --   --   --   TROPONINIHS 100* 169* 795* 727* 553*  --   --   --   --   --   --    < > = values in this interval not displayed.    Estimated Creatinine Clearance: 5.1 mL/min (A) (by C-G formula based on SCr of 9.09 mg/dL (H)).   Medical History: Past Medical History:  Diagnosis Date   Diabetes mellitus without complication (HCC)    Hypertension    Renal disorder     Assessment: Pt is a 68 yo female presenting to ED c/o SOB, found with acute symptomatic nonocclusive RUExt DVT.  Goal of Therapy:  Heparin  level 0.3-0.7 units/ml Monitor platelets by anticoagulation protocol: Yes  Date/Time: HL: Comment/Rate: 1/14@0051  0.92 SUPRAtherapeutic@1100   units/hr 1/14@0846  0.76 SUPRAtherapeutic@1000  units/hr 1/14@2026  0.51   Plan:  Heparin  level is therapeutic. Will continue heparin  infusion at 900 units/hr. Recheck heparin  level in 8 hours. CBC daily while on heparin .   Cathaleen GORMAN Blanch, PharmD Clinical Pharmacist 07/06/2023 9:11 PM

## 2023-07-06 NOTE — Progress Notes (Signed)
 NAME:  Christy Curry, MRN:  [id number], DOB:  14-Feb-1956, LOS: 2 ADMISSION DATE:  07/04/2023 History of Present Illness:    Case of 68 year old female patient with a past medical history of peripheral artery disease, type 2 diabetes mellitus, diabetic neuropathy, GERD, hypertension, aortic stenosis, anemia of CKD, end-stage renal disease on HD via left AV fistula MWF currently on the kidney transplant list at [facility] who presented to [facility] on 01/12 with shortness of breath.   History taken mostly from chart review.   She presents from home with weakness, shortness of breath, cough for couple of days.  Initially on BiPAP with SpO2 of 100%.  Hypertensive and tachycardic.  Labs with leukocytosis neutrophilic predominant 21.6.  H&H stable.  Electrolytes unremarkable.  Creatinine 7.7 g/dL and BUN 28.  Troponin was noted to be elevated initially at 100 and subsequently at 795.  Over the course of the night her respiratory status got worse required intubation and mechanical ventilation.   Chest x-ray with multifocal pneumonia.  Chest x-ray postintubation with possible mucous plugging of the right upper lobe.  RSV positive.    Pertinent  Medical History  -- Hypertension on Amlodipine , Carvedilol , Clonidine , losartan  -- Type II DM Glipizide -- HLD on atorvastatin  and ezetimibe  -- GERD on PPI  -- ESRD on HD, Sevelamer   Significant Hospital Events: Including procedures, antibiotic start and stop dates in addition to other pertinent events   07/04/2022 Intubation and MV, s/p bronchoscopy and airway clearance.   Interim History / Subjective:  Deeply sedated this am. Not following commands.   Objective   Blood pressure (!) 145/58, pulse 82, temperature 97.6 F (36.4 C), temperature source Oral, resp. rate (!) 22, height 5' 1 (1.549 m), weight 62.8 kg, SpO2 98%.    Vent Mode: PRVC FiO2 (%):  [30 %-50 %] 30 % Set Rate:  [18 bmp] 18 bmp Vt Set:  [370 mL] 370 mL PEEP:  [5 cmH20] 5  cmH20 Plateau Pressure:  [17 cmH20-18 cmH20] 17 cmH20   Intake/Output Summary (Last 24 hours) at 07/06/2023 0842 Last data filed at 07/06/2023 0600 Gross per 24 hour  Intake 1346.32 ml  Output 30 ml  Net 1316.32 ml   Filed Weights   07/04/23 1350 07/06/23 0330  Weight: 60.8 kg 62.8 kg    Examination: GEN intubated lightly sedated not able to follow commands HEENT supple neck, reactive pupils CVS loud systolic murmur heard best at the right sternal border Lungs diminished over the right side.  Diffuse rhonchi. Abdomen soft nontender nondistended positive bowel sounds Extremities warm well-perfused no edema   Labs and imaging were reviewed  Assessment & Plan:  Case of 68 year old female patient with a past medical history of peripheral artery disease, type 2 diabetes mellitus, diabetic neuropathy, GERD, hypertension, aortic stenosis, anemia of CKD, end-stage renal disease on HD via left AV fistula MWF currently on the kidney transplant list at [facility] who presented to [facility] on 01/12 with shortness of breath.  Found to be in acute hypoxic respiratory failure requiring intubation and mechanical ventilation secondary to RSV pneumonia possible superimposed bacterial pneumonia and mucous plugging.   #Acute hypoxic respiratory failure requiring intubation and mechanical ventilation 07/05/2023 secondary to.... #RSV pneumonia with possible superimposed bacterial pneumonia and mucous plugging #NSTEMI with troponin increasing peaked at 796, now downtrending. no significant EKG changes likely type II in the setting of respiratory distress. #History of end-stage renal disease on HD MWF via left AV fistula #Moderate aortic stenosis with AV surface  1.2 cm #Hypertension, hyperlipidemia and type 2 diabetes mellitus   Neuro: Propofol  for RASS 0 to -1. Fentanyl  for CPOT<2. c/w home clonidine . CVS: Goal MAP > = 65. C/w home clonidine  and carvedilol . Hold home losartan .  Lungs: MRSA swab -, RSV +, Trach  aspirate pending, Ceftriaxone  and azithromycin . Prednisone  40mg  daily per CAPE COD.  GI: Start TF. PPI daily. Lax as needed.   Renal: Renal onboard. Plan for HD this AM.  c/w home sevelamer .  Heme: Heparin  drip  Endo: ISS POC goal 140-180   Best Practice (right click and Reselect all SmartList Selections daily)   Diet/type: tubefeeds DVT prophylaxis systemic heparin  Pressure ulcer(s): N/A GI prophylaxis: PPI Lines: Dialysis Catheter Foley:  N/A Code Status:  full code  Last date of multidisciplinary goals of care discussion [07/06/2023]  I spent 55 minutes caring for this patient today, including preparing to see the patient, obtaining a medical history , reviewing a separately obtained history, performing a medically appropriate examination and/or evaluation, ordering medications, tests, or procedures, documenting clinical information in the electronic health record, and independently interpreting results (not separately reported/billed) and communicating results to the patient/family/caregiver  Darrin Barn, MD [facility] 07/06/2023 8:52 AM

## 2023-07-06 NOTE — Progress Notes (Signed)
 ANTICOAGULATION CONSULT NOTE  Pharmacy Consult for heparin  infusion Indication: VTE Tx  Allergies  Allergen Reactions   2,4-D Dimethylamine Swelling   Other Swelling   Tetanus Antitoxin Itching   Tetanus Toxoid    Tetanus Toxoids Itching    Patient Measurements: Height: 5' 1 (154.9 cm) Weight: 60.8 kg (134 lb) IBW/kg (Calculated) : 47.8 Heparin  Dosing Weight: 60.8 kg  Vital Signs: Temp: 98 F (36.7 C) (01/14 0000) Temp Source: Oral (01/14 0000) BP: 115/52 (01/14 0030) Pulse Rate: 62 (01/14 0030)  Labs: Recent Labs    07/04/23 1348 07/04/23 1541 07/05/23 0403 07/05/23 0754 07/05/23 1422 07/05/23 2339 07/06/23 0051  HGB 11.9*  --  10.0*  --   --   --   --   HCT 40.5  --  31.6*  --   --   --   --   PLT 295  --  264  --   --   --   --   APTT  --  25  --   --   --   --   --   LABPROT  --  15.3*  --   --   --   --   --   INR  --  1.2  --   --   --   --   --   HEPARINUNFRC  --   --   --   --  0.29* >1.10* 0.92*  CREATININE 7.07*  --  8.01*  --   --   --   --   TROPONINIHS 100* 169* 795* 727* 553*  --   --     Estimated Creatinine Clearance: 5.6 mL/min (A) (by C-G formula based on SCr of 8.01 mg/dL (H)).   Medical History: Past Medical History:  Diagnosis Date   Diabetes mellitus without complication (HCC)    Hypertension    Renal disorder     Assessment: Pt is a 68 yo female presenting to ED c/o SOB, found with acute symptomatic nonocclusive RUExt DVT.  Goal of Therapy:  Heparin  level 0.3-0.7 units/ml Monitor platelets by anticoagulation protocol: Yes   Plan:  1/13 @ 2339:  HL = > 1.10, elevated - RN stated was drawn by lab.  Spoke with lab who said it was drawn from peripheral line.  RN says there is only 1 peripheral line running and it is also infusing heparin  so possibly drawn incorrectly.  STAT repeat HL ordered.   RN will draw from central line.   1/14 @ 0051:  HL = 0.92, elevated - STAT repeat of previous level - repeat HL also elevated so  will decrease heparin  drip to 1000 units/hr. Will recheck HL in 8 hr after rate change CBC daily while on heparin   Tashi Andujo D, PharmD Clinical Pharmacist 07/06/2023 1:19 AM

## 2023-07-06 NOTE — Significant Event (Signed)
 Acute Hypoxic Respiratory Failure secondary to Suspected Aspiration in the setting of recent extubation Called bedside as patient was in respiratory distress with continues, lung sounds coarse throughout. Patient had vomited earlier in day post extubation. Supplemental oxygen applied, phenergan  administered but patient persistently hypoxic with increased WOB. Discussed with son and patient bedside, decision made to emergently intubate and place back on mechanical ventilatory support. Case discussed with Intensivist on call. - Ventilator settings: PRVC  8 mL/kg, 100% FiO2, 8 PEEP, continue ventilator support & lung protective strategies - Wean PEEP & FiO2 as tolerated, maintain SpO2 > 90% - Head of bed elevated 30 degrees, VAP protocol in place - Plateau pressures less than 30 cm H20  - Intermittent chest x-ray & ABG PRN - Daily WUA with SBT as tolerated  - Ensure adequate pulmonary hygiene  - F/u cultures, trend PCT - Continue CAP/Aspiration Pna coverage ceftriaxone /azithromycin  - bronchodilators PRN - PAD protocol in place: continue Fentanyl  drip & Propofol  drip - f/u CT abdomen/pelvis   Jenita Ruth Rust-Chester, AGACNP-BC Acute Care Nurse Practitioner [facility]   [phone] / [phone] Please see Amion for details.

## 2023-07-07 DIAGNOSIS — R918 Other nonspecific abnormal finding of lung field: Secondary | ICD-10-CM | POA: Diagnosis not present

## 2023-07-07 LAB — CBC WITH DIFFERENTIAL/PLATELET
Abs Immature Granulocytes: 0.11 10*3/uL — ABNORMAL HIGH (ref 0.00–0.07)
Basophils Absolute: 0 10*3/uL (ref 0.0–0.1)
Basophils Relative: 0 %
Eosinophils Absolute: 0 10*3/uL (ref 0.0–0.5)
Eosinophils Relative: 0 %
HCT: 24.7 % — ABNORMAL LOW (ref 36.0–46.0)
Hemoglobin: 7.9 g/dL — ABNORMAL LOW (ref 12.0–15.0)
Immature Granulocytes: 1 %
Lymphocytes Relative: 6 %
Lymphs Abs: 0.7 10*3/uL (ref 0.7–4.0)
MCH: 31 pg (ref 26.0–34.0)
MCHC: 32 g/dL (ref 30.0–36.0)
MCV: 96.9 fL (ref 80.0–100.0)
Monocytes Absolute: 0.8 10*3/uL (ref 0.1–1.0)
Monocytes Relative: 6 %
Neutro Abs: 10.3 10*3/uL — ABNORMAL HIGH (ref 1.7–7.7)
Neutrophils Relative %: 87 %
Platelets: 220 10*3/uL (ref 150–400)
RBC: 2.55 MIL/uL — ABNORMAL LOW (ref 3.87–5.11)
RDW: 14.9 % (ref 11.5–15.5)
WBC: 11.9 10*3/uL — ABNORMAL HIGH (ref 4.0–10.5)
nRBC: 0 % (ref 0.0–0.2)

## 2023-07-07 LAB — HEPATIC FUNCTION PANEL
ALT: 11 U/L (ref 0–44)
AST: 15 U/L (ref 15–41)
Albumin: 2.6 g/dL — ABNORMAL LOW (ref 3.5–5.0)
Alkaline Phosphatase: 53 U/L (ref 38–126)
Bilirubin, Direct: <0.1 mg/dL (ref 0.0–0.2)
Total Bilirubin: 0.6 mg/dL (ref 0.0–1.2)
Total Protein: 5.5 g/dL — ABNORMAL LOW (ref 6.5–8.1)

## 2023-07-07 LAB — GLUCOSE, CAPILLARY
Glucose-Capillary: 122 mg/dL — ABNORMAL HIGH (ref 70–99)
Glucose-Capillary: 92 mg/dL (ref 70–99)
Glucose-Capillary: 89 mg/dL (ref 70–99)
Glucose-Capillary: 121 mg/dL — ABNORMAL HIGH (ref 70–99)
Glucose-Capillary: 116 mg/dL — ABNORMAL HIGH (ref 70–99)
Glucose-Capillary: 102 mg/dL — ABNORMAL HIGH (ref 70–99)

## 2023-07-07 LAB — RENAL FUNCTION PANEL
Albumin: 2.6 g/dL — ABNORMAL LOW (ref 3.5–5.0)
Anion gap: 11 (ref 5–15)
BUN: 36 mg/dL — ABNORMAL HIGH (ref 8–23)
CO2: 26 mmol/L (ref 22–32)
Calcium: 8 mg/dL — ABNORMAL LOW (ref 8.9–10.3)
Chloride: 99 mmol/L (ref 98–111)
Creatinine, Ser: 5.86 mg/dL — ABNORMAL HIGH (ref 0.44–1.00)
GFR, Estimated: 7 mL/min — ABNORMAL LOW (ref >60)
Glucose, Bld: 134 mg/dL — ABNORMAL HIGH (ref 70–99)
Phosphorus: 6.3 mg/dL — ABNORMAL HIGH (ref 2.5–4.6)
Potassium: 4.1 mmol/L (ref 3.5–5.1)
Sodium: 136 mmol/L (ref 135–145)

## 2023-07-07 LAB — TRIGLYCERIDES: Triglycerides: 91 mg/dL (ref <150)

## 2023-07-07 LAB — HEPARIN LEVEL (UNFRACTIONATED): Heparin Unfractionated: 0.45 [IU]/mL (ref 0.30–0.70)

## 2023-07-07 LAB — MAGNESIUM: Magnesium: 2.2 mg/dL (ref 1.7–2.4)

## 2023-07-07 MED ORDER — DEXMEDETOMIDINE HCL IN NACL 400 MCG/100ML IV SOLN
0.0000 ug/kg/h | INTRAVENOUS | Status: DC
  Administered 2023-07-08: 0.2 ug/kg/h via INTRAVENOUS
  Administered 2023-07-09: 0.3 ug/kg/h via INTRAVENOUS
  Administered 2023-07-10: 0.5 ug/kg/h via INTRAVENOUS
  Filled 2023-07-07 (×3): qty 100

## 2023-07-07 MED ORDER — ACETAMINOPHEN 650 MG RE SUPP: 650.0000 mg | Freq: Four times a day (QID) | RECTAL | Status: DC | PRN

## 2023-07-07 MED ORDER — PROSOURCE TF20 ENFIT COMPATIBL EN LIQD
60.0000 mL | Freq: Every day | ENTERAL | Status: DC
  Filled 2023-07-07: qty 60

## 2023-07-07 MED ORDER — ORAL CARE MOUTH RINSE: 15.0000 mL | OROMUCOSAL | Status: DC | PRN

## 2023-07-07 MED ORDER — FREE WATER: 30.0000 mL | Status: DC

## 2023-07-07 MED ORDER — ACETAMINOPHEN 325 MG PO TABS: 650.0000 mg | ORAL_TABLET | Freq: Four times a day (QID) | ORAL | Status: DC | PRN

## 2023-07-07 MED ORDER — VITAL AF 1.2 CAL PO LIQD: 1000.0000 mL | ORAL | Status: DC

## 2023-07-07 MED ORDER — FENTANYL CITRATE PF 50 MCG/ML IJ SOSY
12.5000 ug | PREFILLED_SYRINGE | Freq: Once | INTRAMUSCULAR | Status: AC
  Administered 2023-07-07: 12.5 ug via INTRAVENOUS
  Filled 2023-07-07: qty 1

## 2023-07-07 MED ORDER — IPRATROPIUM-ALBUTEROL 0.5-2.5 (3) MG/3ML IN SOLN
3.0000 mL | Freq: Four times a day (QID) | RESPIRATORY_TRACT | Status: DC | PRN
  Administered 2023-07-08 (×2): 3 mL via RESPIRATORY_TRACT
  Filled 2023-07-07: qty 6
  Filled 2023-07-07 (×2): qty 3

## 2023-07-07 MED ORDER — PIPERACILLIN-TAZOBACTAM IN DEX 2-0.25 GM/50ML IV SOLN
2.2500 g | Freq: Three times a day (TID) | INTRAVENOUS | Status: AC
  Administered 2023-07-07 – 2023-07-11 (×14): 2.25 g via INTRAVENOUS
  Filled 2023-07-07 (×14): qty 50

## 2023-07-07 NOTE — Progress Notes (Addendum)
 Fentanyl  stop and propofol  cut to 15 mcg at 1207 to trial SBT. Pt laying in bed comfortable with daughter at bedside.  1330 sedation cut completely off. Pt awake following commands. RT at beside to place pt in spontaneous breathing therapy.  1410. Pt continues to tolerate SBT, RT at bedside.   1540 MD notified pt continues to tolerate SBT. Per MD pt's OG to be connected to low intermittent suction. Dialysis RN at bedside dialyzing pt.   1835 pt extubated by Cathleen Coach RT. Pt able to follow commands, state her name, cough and take a deep breath. Pt was put into 3L of oxygen nasal canula.

## 2023-07-07 NOTE — Progress Notes (Signed)
 NAME:  Christy Curry, MRN:  [id number], DOB:  1956/06/05, LOS: 3 ADMISSION DATE:  07/04/2023 History of Present Illness:    Case of 68 year old female patient with a past medical history of peripheral artery disease, type 2 diabetes mellitus, diabetic neuropathy, GERD, hypertension, aortic stenosis, anemia of CKD, end-stage renal disease on HD via left AV fistula MWF currently on the kidney transplant list at [facility] who presented to [facility] on 01/12 with shortness of breath.   History taken mostly from chart review.   She presents from home with weakness, shortness of breath, cough for couple of days.  Initially on BiPAP with SpO2 of 100%.  Hypertensive and tachycardic.  Labs with leukocytosis neutrophilic predominant 21.6.  H&H stable.  Electrolytes unremarkable.  Creatinine 7.7 g/dL and BUN 28.  Troponin was noted to be elevated initially at 100 and subsequently at 795.  Over the course of the night her respiratory status got worse required intubation and mechanical ventilation.   Chest x-ray with multifocal pneumonia.  Chest x-ray postintubation with possible mucous plugging of the right upper lobe.  RSV positive.    Pertinent  Medical History  -- Hypertension on Amlodipine , Carvedilol , Clonidine , losartan  -- Type II DM Glipizide -- HLD on atorvastatin  and ezetimibe  -- GERD on PPI  -- ESRD on HD, Sevelamer   Significant Hospital Events: Including procedures, antibiotic start and stop dates in addition to other pertinent events   07/04/2022 Intubation and MV, s/p bronchoscopy and airway clearance.  07/05/2022 Extubated but unfortunately had a multiple vomiting events through the day and night --> significant aspiration event requiring reintubation 07/05/2022 at night.   Interim History / Subjective:  Deeply sedated this am. Not following commands.   Objective   Blood pressure (!) 148/61, pulse 70, temperature 98.5 F (36.9 C), temperature source Oral, resp. rate 18, height 5\' 1"   (1.549 m), weight 60.9 kg, SpO2 100%.    Vent Mode: PRVC FiO2 (%):  [30 %-100 %] 40 % Set Rate:  [18 bmp] 18 bmp Vt Set:  [370 mL] 370 mL PEEP:  [5 cmH20-8 cmH20] 8 cmH20 Pressure Support:  [5 cmH20-10 cmH20] 5 cmH20 Plateau Pressure:  [20 cmH20] 20 cmH20   Intake/Output Summary (Last 24 hours) at 07/07/2023 0815 Last data filed at 07/07/2023 0400 Gross per 24 hour  Intake 1167.59 ml  Output 550 ml  Net 617.59 ml   Filed Weights   07/06/23 0920 07/06/23 1221 07/07/23 0330  Weight: 64 kg 63.5 kg 60.9 kg    Examination: GEN intubated sedated not able to follow commands HEENT supple neck, reactive pupils CVS loud systolic murmur heard best at the right sternal border Lungs diminished over the right side.  Diffuse rhonchi. Abdomen soft nontender nondistended positive bowel sounds Extremities warm well-perfused no edema   Labs and imaging were reviewed  Back to minimal vent settings.   Assessment & Plan:  Case of 68 year old female patient with a past medical history of peripheral artery disease, type 2 diabetes mellitus, diabetic neuropathy, GERD, hypertension, aortic stenosis, anemia of CKD, end-stage renal disease on HD via left AV fistula MWF currently on the kidney transplant list at [facility] who presented to [facility] on 01/12 with shortness of breath.  Found to be in acute hypoxic respiratory failure requiring intubation and mechanical ventilation secondary to RSV pneumonia possible superimposed bacterial pneumonia and mucous plugging.   #Acute hypoxic respiratory failure requiring intubation and mechanical ventilation 07/05/2023 secondary to.... #RSV pneumonia with possible superimposed bacterial pneumonia and mucous  plugging s/p extubation 07/05/2022 with course complicated by aspiration event requring re-intubation 07/05/2022 at night.  #Type II NSTEMI secondary to demand #History of end-stage renal disease on HD MWF via left AV fistula #Moderate aortic stenosis with AV surface  1.2 cm #Hypertension, hyperlipidemia and type 2 diabetes mellitus   Neuro: Propofol  for RASS 0 to -1. Fentanyl  for CPOT<2. c/w home clonidine . CVS: Goal MAP > = 65. C/w home clonidine  and carvedilol . Hold home losartan .  Lungs: MRSA swab -, RSV +, Trach aspirate remains negative. Completed Azithromycin . Switch Ceftriaxone  to Zosyn  given inhospital aspiration event. Prednisone  40mg  daily (D4) per CAPE COD.  GI: Start TF. PPI daily. Lax as needed.   Renal: Renal onboard.  c/w home sevelamer .  Heme: Heparin  drip  Endo: ISS POC goal 140-180   Best Practice (right click and "Reselect all SmartList Selections" daily)   Diet/type: tubefeeds DVT prophylaxis systemic heparin  Pressure ulcer(s): N/A GI prophylaxis: PPI Lines: Dialysis Catheter Foley:  N/A Code Status:  full code  Last date of multidisciplinary goals of care discussion [07/06/2023]  I spent 50 minutes caring for this patient today, including preparing to see the patient, obtaining a medical history , reviewing a separately obtained history, performing a medically appropriate examination and/or evaluation, ordering medications, tests, or procedures, documenting clinical information in the electronic health record, and independently interpreting results (not separately reported/billed) and communicating results to the patient/family/caregiver  Christy Kindler, MD [facility] 07/07/2023 8:15 AM

## 2023-07-07 NOTE — Discharge Planning (Signed)
 ESTABLISHED HEMODIALYSIS Outpatient Facility  [facility]  [address]  [place], [place] [address] [phone]  Schedule: MWF 6:45am  Arnette Lansing Dialysis Coordinator II  [employer] [facility] Cell: [phone] eFax: [phone] [email or web address]

## 2023-07-07 NOTE — Progress Notes (Signed)
 ANTICOAGULATION CONSULT NOTE  Pharmacy Consult for heparin  infusion Indication: VTE Tx  Allergies  Allergen Reactions   2,4-D Dimethylamine Swelling   Other Swelling   Tetanus Antitoxin Itching   Tetanus Toxoid    Tetanus Toxoids Itching    Patient Measurements: Height: 5\' 1"  (154.9 cm) Weight: 60.9 kg (134 lb 4.2 oz) IBW/kg (Calculated) : 47.8 Heparin  Dosing Weight: 60.8 kg  Vital Signs: Temp: 99.1 F (37.3 C) (01/15 0400) Temp Source: Axillary (01/15 0400) BP: 149/66 (01/15 0415) Pulse Rate: 70 (01/15 0415)  Labs: Recent Labs    07/04/23 1541 07/05/23 0403 07/05/23 0754 07/05/23 1422 07/05/23 2339 07/06/23 0403 07/06/23 0846 07/06/23 1302 07/06/23 2026 07/07/23 0404  HGB  --  10.0*  --   --   --  7.9*  --  7.8*  --  7.9*  HCT  --  31.6*  --   --   --  24.5*  --  24.2*  --  24.7*  PLT  --  264  --   --   --  204  --  212  --  220  APTT 25  --   --   --   --   --   --   --   --   --   LABPROT 15.3*  --   --   --   --   --   --   --   --   --   INR 1.2  --   --   --   --   --   --   --   --   --   HEPARINUNFRC  --   --   --  0.29*   < >  --  0.76*  --  0.51 0.45  CREATININE  --  8.01*  --   --   --  9.09*  --   --   --  5.86*  TROPONINIHS 169* 795* 727* 553*  --   --   --   --   --   --    < > = values in this interval not displayed.    Estimated Creatinine Clearance: 7.7 mL/min (A) (by C-G formula based on SCr of 5.86 mg/dL (H)).   Medical History: Past Medical History:  Diagnosis Date   Diabetes mellitus without complication (HCC)    Hypertension    Renal disorder     Assessment: Pt is a 68 yo female presenting to ED c/o SOB, found with "acute symptomatic nonocclusive RUExt DVT."  Goal of Therapy:  Heparin  level 0.3-0.7 units/ml Monitor platelets by anticoagulation protocol: Yes  Date/Time: HL: Comment/Rate: 1/14@0051  0.92 SUPRAtherapeutic@1100  units/hr 1/14@0846  0.76 SUPRAtherapeutic@1000  units/hr 1/14@2026  0.51 1/15@0404      0.45      Therapeutic X 2    Plan:  1/15:  HL @ 0404 = 0.45, therapeutic X 2 - Will continue pt on current rate and recheck HL on 1/16 with AM labs.  Alvenia Job, PharmD Clinical Pharmacist 07/07/2023 4:57 AM

## 2023-07-07 NOTE — Progress Notes (Signed)
 [facility]  ROUNDING NOTE   Subjective:   Patient underwent hemodialysis treatment yesterday. Due for hemodialysis treatment again today per usual schedule. Still on the ventilator.  Objective:  Vital signs in last 24 hours:  Temp:  [97.7 F (36.5 C)-99.1 F (37.3 C)] 98.5 F (36.9 C) (01/15 0754) Pulse Rate:  [61-109] 70 (01/15 0700) Resp:  [0-28] 18 (01/15 0700) BP: (86-188)/(38-93) 148/61 (01/15 0700) SpO2:  [81 %-100 %] 100 % (01/15 0700) FiO2 (%):  [30 %-100 %] 40 % (01/15 0445) Weight:  [60.9 kg-64 kg] 60.9 kg (01/15 0330)  Weight change: 1.2 kg Filed Weights   07/06/23 0920 07/06/23 1221 07/07/23 0330  Weight: 64 kg 63.5 kg 60.9 kg    Intake/Output: I/O last 3 completed shifts: In: 1798.4 [I.V.:769; NG/GT:636.3; IV Piggyback:393.1] Out: 550 [Urine:50; Other:500]   Intake/Output this shift:  No intake/output data recorded.  Physical Exam: General: Critically ill-appearing  Head: Normocephalic, atraumatic.  Endotracheal tube in place  Neck: Supple  Lungs:  Coarse breath sounds bilateral, vent assisted  Heart: S1S2 no rubs  Abdomen:  Soft, nontender, bowel sounds present  Extremities: Trace peripheral edema.  Neurologic: Intubated but arousable  Skin: No acute rash  Access: Left upper extremity AV graft, right femoral temporary dialysis catheter    Basic Metabolic Panel: Recent Labs  Lab 07/04/23 1348 07/05/23 0403 07/06/23 0403 07/07/23 0404  NA 140 144 141 136  K 3.8 3.7 4.1 4.1  CL 106 104 104 99  CO2 19* 23 23 26   GLUCOSE 171* 188* 150* 134*  BUN 28* 35* 47* 36*  CREATININE 7.07* 8.01* 9.09* 5.86*  CALCIUM  9.1 8.8* 8.0* 8.0*  MG 2.5*  --  2.2 2.2  PHOS  --   --  6.7* 6.3*    Liver Function Tests: Recent Labs  Lab 07/04/23 1348 07/06/23 0403 07/07/23 0404  AST 23  --  15  ALT 13  --  11  ALKPHOS 81  --  53  BILITOT 0.7  --  0.6  PROT 7.2  --  5.5*  ALBUMIN 3.9 2.7* 2.6*  2.6*   Recent Labs  Lab 07/04/23 1348   LIPASE 21   No results for input(s): "AMMONIA" in the last 168 hours.  CBC: Recent Labs  Lab 07/04/23 1348 07/05/23 0403 07/06/23 0403 07/06/23 1302 07/07/23 0404  WBC 21.6* 13.5* 13.2* 12.3* 11.9*  NEUTROABS 18.5*  --  11.6*  --  10.3*  HGB 11.9* 10.0* 7.9* 7.8* 7.9*  HCT 40.5 31.6* 24.5* 24.2* 24.7*  MCV 106.9* 97.5 97.2 96.8 96.9  PLT 295 264 204 212 220    Cardiac Enzymes: No results for input(s): "CKTOTAL", "CKMB", "CKMBINDEX", "TROPONINI" in the last 168 hours.  BNP: Invalid input(s): "POCBNP"  CBG: Recent Labs  Lab 07/06/23 1649 07/06/23 1940 07/06/23 2347 07/07/23 0320 07/07/23 0751  GLUCAP 184* 178* 154* 122* 92    Microbiology: Results for orders placed or performed during the hospital encounter of 07/04/23  Resp panel by RT-PCR (RSV, Flu A&B, Covid) Anterior Nasal Swab     Status: Abnormal   Collection Time: 07/04/23  1:48 PM   Specimen: Anterior Nasal Swab  Result Value Ref Range Status   SARS Coronavirus 2 by RT PCR NEGATIVE NEGATIVE Final    Comment: (NOTE) SARS-CoV-2 target nucleic acids are NOT DETECTED.  The SARS-CoV-2 RNA is generally detectable in upper respiratory specimens during the acute phase of infection. The lowest concentration of SARS-CoV-2 viral copies this assay can detect is 138  copies/mL. A negative result does not preclude SARS-Cov-2 infection and should not be used as the sole basis for treatment or other patient management decisions. A negative result may occur with  improper specimen collection/handling, submission of specimen other than nasopharyngeal swab, presence of viral mutation(s) within the areas targeted by this assay, and inadequate number of viral copies(<138 copies/mL). A negative result must be combined with clinical observations, patient history, and epidemiological information. The expected result is Negative.  Fact Sheet for Patients:  BloggerCourse.com  Fact Sheet for  Healthcare Providers:  SeriousBroker.it  This test is no t yet approved or cleared by the [place]  FDA and  has been authorized for detection and/or diagnosis of SARS-CoV-2 by FDA under an Emergency Use Authorization (EUA). This EUA will remain  in effect (meaning this test can be used) for the duration of the COVID-19 declaration under Section 564(b)(1) of the Act, 21 U.S.C.section 360bbb-3(b)(1), unless the authorization is terminated  or revoked sooner.       Influenza A by PCR NEGATIVE NEGATIVE Final   Influenza B by PCR NEGATIVE NEGATIVE Final    Comment: (NOTE) The Xpert Xpress SARS-CoV-2/FLU/RSV plus assay is intended as an aid in the diagnosis of influenza from Nasopharyngeal swab specimens and should not be used as a sole basis for treatment. Nasal washings and aspirates are unacceptable for Xpert Xpress SARS-CoV-2/FLU/RSV testing.  Fact Sheet for Patients: BloggerCourse.com  Fact Sheet for Healthcare Providers: SeriousBroker.it  This test is not yet approved or cleared by the [place]  FDA and has been authorized for detection and/or diagnosis of SARS-CoV-2 by FDA under an Emergency Use Authorization (EUA). This EUA will remain in effect (meaning this test can be used) for the duration of the COVID-19 declaration under Section 564(b)(1) of the Act, 21 U.S.C. section 360bbb-3(b)(1), unless the authorization is terminated or revoked.     Resp Syncytial Virus by PCR POSITIVE (A) NEGATIVE Final    Comment: (NOTE) Fact Sheet for Patients: BloggerCourse.com  Fact Sheet for Healthcare Providers: SeriousBroker.it  This test is not yet approved or cleared by the [place]  FDA and has been authorized for detection and/or diagnosis of SARS-CoV-2 by FDA under an Emergency Use Authorization (EUA). This EUA will remain in effect (meaning  this test can be used) for the duration of the COVID-19 declaration under Section 564(b)(1) of the Act, 21 U.S.C. section 360bbb-3(b)(1), unless the authorization is terminated or revoked.  Performed at [facility], [address] [place], [place] [address]   Blood Culture (routine x 2)     Status: None (Preliminary result)   Collection Time: 07/04/23  3:41 PM   Specimen: BLOOD  Result Value Ref Range Status   Specimen Description BLOOD BLOOD RIGHT WRIST  Final   Special Requests   Final    BOTTLES DRAWN AEROBIC AND ANAEROBIC Blood Culture adequate volume   Culture   Final    NO GROWTH 2 DAYS Performed at [facility], [address], [place], [place] [address]    Report Status PENDING  Incomplete  Blood Culture (routine x 2)     Status: None (Preliminary result)   Collection Time: 07/04/23  3:41 PM   Specimen: BLOOD  Result Value Ref Range Status   Specimen Description BLOOD BLOOD RIGHT HAND  Final   Special Requests   Final    BOTTLES DRAWN AEROBIC AND ANAEROBIC Blood Culture adequate volume   Culture   Final    NO GROWTH 2 DAYS Performed at  [facility], [address] [place], [place] [address]    Report Status PENDING  Incomplete  MRSA Next Gen by PCR, Nasal     Status: None   Collection Time: 07/04/23  6:20 PM   Specimen: Nasal Mucosa; Nasal Swab  Result Value Ref Range Status   MRSA by PCR Next Gen NOT DETECTED NOT DETECTED Final    Comment: (NOTE) The GeneXpert MRSA Assay (FDA approved for NASAL specimens only), is one component of a comprehensive MRSA colonization surveillance program. It is not intended to diagnose MRSA infection nor to guide or monitor treatment for MRSA infections. Test performance is not FDA approved in patients less than 46 years old. Performed at [facility], [address] [place], [place] [address]   Culture, Respiratory w Gram Stain     Status: None (Preliminary result)   Collection Time:  07/05/23  8:07 AM   Specimen: Tracheal Aspirate; Respiratory  Result Value Ref Range Status   Specimen Description   Final    TRACHEAL ASPIRATE Performed at [facility], [address] [place], [place] [address]    Special Requests   Final    NONE Performed at [facility], [address] [place], [place] [address]    Gram Stain   Final    MODERATE WBC PRESENT, PREDOMINANTLY PMN RARE GRAM POSITIVE COCCI IN PAIRS    Culture   Final    NO GROWTH < 24 HOURS Performed at [facility], [address] [place], [place] [address]    Report Status PENDING  Incomplete  Culture, Respiratory w Gram Stain     Status: None (Preliminary result)   Collection Time: 07/05/23  3:26 PM   Specimen: Bronchoalveolar Lavage; Respiratory  Result Value Ref Range Status   Specimen Description   Final    BRONCHIAL ALVEOLAR LAVAGE Performed at [facility], [address] [place], [place] [address]    Special Requests   Final    NONE Performed at [facility], [address] [place], [place] [address]    Gram Stain   Final    FEW WBC PRESENT, PREDOMINANTLY PMN NO ORGANISMS SEEN    Culture   Final    NO GROWTH < 12 HOURS Performed at [facility], [address] [place], [place] [address]    Report Status PENDING  Incomplete    Coagulation Studies: Recent Labs    07/04/23 1541  LABPROT 15.3*  INR 1.2    Urinalysis: Recent Labs    07/04/23 2334  COLORURINE YELLOW*  LABSPEC 1.012  PHURINE 8.0  GLUCOSEU >=500*  HGBUR NEGATIVE  BILIRUBINUR NEGATIVE  KETONESUR NEGATIVE  PROTEINUR >=300*  NITRITE NEGATIVE  LEUKOCYTESUR NEGATIVE      Imaging: CT ABDOMEN PELVIS W CONTRAST Result Date: 07/06/2023 CLINICAL DATA:  Aspiration possible bowel obstruction vomiting EXAM: CT ABDOMEN AND PELVIS WITH CONTRAST TECHNIQUE: Multidetector CT imaging of the abdomen and pelvis was performed using the standard protocol following bolus  administration of intravenous contrast. RADIATION DOSE REDUCTION: This exam was performed according to the departmental dose-optimization program which includes automated exposure control, adjustment of the mA and/or kV according to patient size and/or use of iterative reconstruction technique. CONTRAST:  OMNIPAQUE  IOHEXOL  300 MG/ML  SOLN COMPARISON:  Radiograph 07/06/2023, CT 11/25/2022 FINDINGS: Lower chest: Lung bases demonstrate moderate incompletely visualized pleural effusions. Extensive consolidation and ground-glass density in the right upper, right middle and bilateral lower lobes with additional ground-glass disease  in the lingula. Cardiac size is normal. Coronary vascular calcification Hepatobiliary: Left hepatic lobe appears atrophic. Small gallstones. No biliary dilatation Pancreas: Unremarkable. No pancreatic ductal dilatation or surrounding inflammatory changes. Spleen: Normal in size without focal abnormality. Adrenals/Urinary Tract: Adrenal glands are normal. Kidneys show no hydronephrosis. Cyst in the right kidney for which no imaging follow-up is recommended. The bladder is unremarkable Stomach/Bowel: Enteric tube tip in the distal stomach. There is no dilated small bowel. No acute bowel wall thickening. Vascular/Lymphatic: Moderate severe aortic atherosclerosis. No aneurysm. No suspicious lymph nodes. Reproductive: No adnexal mass.  Posterior uterine wall fibroid Other: Negative for free air.  No significant free fluid Musculoskeletal: No acute or suspicious osseous abnormality IMPRESSION: 1. No CT evidence for acute intra-abdominal or pelvic abnormality. No bowel obstruction or acute bowel wall thickening. 2. Enteric tube tip in the distal stomach. 3. Moderate incompletely visualized moderate bilateral pleural effusions with extensive consolidation and ground-glass density in the lung bases, suspect for multifocal pneumonia. There may be associated/underlying edema. 4. Gallstones. 5.  Aortic atherosclerosis. Aortic Atherosclerosis (ICD10-I70.0). Electronically Signed   By: Esmeralda Hedge M.D.   On: 07/06/2023 23:07   DG Chest Port 1 View Result Date: 07/06/2023 CLINICAL DATA:  Hypoxia, intubated EXAM: PORTABLE CHEST 1 VIEW COMPARISON:  07/06/2023 at 0857 hours FINDINGS: Multifocal patchy right lung opacities, progressive. Aspiration/pneumonia is favored over moderate interstitial edema. Possible small right pleural effusion, equivocal. No pneumothorax. The heart is normal in size. Endotracheal tube terminates 2.8 cm above the carina. Enteric tube courses below stomach. IMPRESSION: Multifocal patchy right lung opacities, progressive. Aspiration/pneumonia is favored over moderate interstitial edema. Support apparatus as above. Electronically Signed   By: Zadie Herter M.D.   On: 07/06/2023 22:12   DG Abd 1 View Result Date: 07/06/2023 CLINICAL DATA:  Vomiting EXAM: ABDOMEN - 1 VIEW COMPARISON:  None Available. FINDINGS: Nonobstructive bowel gas pattern. Enteric tube terminates in the gastric antrum. Right groin catheter. Bilateral tubal ligation clips. IMPRESSION: Enteric tube terminates in the gastric antrum. Electronically Signed   By: Zadie Herter M.D.   On: 07/06/2023 22:11   ECHOCARDIOGRAM COMPLETE Result Date: 07/06/2023    ECHOCARDIOGRAM REPORT   Patient Name:   Christy Curry Date of Exam: 07/06/2023 Medical Rec #:  [id number]               Height:       61.0 in Accession #:    [id number]              Weight:       140.0 lb Date of Birth:  10-27-1955                BSA:          1.623 m Patient Age:    67 years                BP:           86/60 mmHg Patient Gender: F                       HR:           91 bpm. Exam Location:  [facility] Procedure: 2D Echo, Cardiac Doppler and Color Doppler Indications:     Other abnormalities of the heart R00.8  History:         Patient has prior history of Echocardiogram examinations, most  recent 11/26/2022. ESRD,  Signs/Symptoms:Chest Pain; Risk                  Factors:Hypertension, Diabetes and Dyslipidemia.  Sonographer:     Terrilee Few RCS Referring Phys:  [id number] Phoebe Breed OUMA Diagnosing Phys: Sammy Crisp MD IMPRESSIONS  1. Left ventricular ejection fraction, by estimation, is 50 to 55%. The left ventricle has low normal function. Left ventricular endocardial border not optimally defined to evaluate regional wall motion. There is mild left ventricular hypertrophy. Left ventricular diastolic parameters are consistent with Grade II diastolic dysfunction (pseudonormalization). Elevated left atrial pressure.  2. Right ventricular systolic function is normal. The right ventricular size is normal. There is normal pulmonary artery systolic pressure.  3. The mitral valve is degenerative. Mild to moderate mitral valve regurgitation. Moderate mitral annular calcification.  4. Tricuspid valve regurgitation is mild to moderate.  5. The aortic valve has an indeterminant number of cusps. There is severe calcifcation of the aortic valve. There is moderate thickening of the aortic valve. Aortic valve regurgitation is trivial. Severe aortic valve stenosis. Aortic valve area, by VTI measures 0.70 cm. Aortic valve mean gradient measures 47.0 mmHg.  6. The inferior vena cava is normal in size with <50% respiratory variability, suggesting right atrial pressure of 8 mmHg. FINDINGS  Left Ventricle: Left ventricular ejection fraction, by estimation, is 50 to 55%. The left ventricle has low normal function. Left ventricular endocardial border not optimally defined to evaluate regional wall motion. The left ventricular internal cavity  size was normal in size. There is mild left ventricular hypertrophy. Left ventricular diastolic parameters are consistent with Grade II diastolic dysfunction (pseudonormalization). Elevated left atrial pressure. Right Ventricle: The right ventricular size is normal. No increase in right  ventricular wall thickness. Right ventricular systolic function is normal. There is normal pulmonary artery systolic pressure. The tricuspid regurgitant velocity is 2.55 m/s, and  with an assumed right atrial pressure of 8 mmHg, the estimated right ventricular systolic pressure is 34.0 mmHg. Left Atrium: Left atrial size was normal in size. Right Atrium: Right atrial size was normal in size. Pericardium: There is no evidence of pericardial effusion. Mitral Valve: The mitral valve is degenerative in appearance. There is mild thickening of the mitral valve leaflet(s). There is mild calcification of the mitral valve leaflet(s). Moderate mitral annular calcification. Mild to moderate mitral valve regurgitation. Tricuspid Valve: The tricuspid valve is not well visualized. Tricuspid valve regurgitation is mild to moderate. Aortic Valve: The aortic valve has an indeterminant number of cusps. There is severe calcifcation of the aortic valve. There is moderate thickening of the aortic valve. Aortic valve regurgitation is trivial. Severe aortic stenosis is present. Aortic valve mean gradient measures 47.0 mmHg. Aortic valve peak gradient measures 60.8 mmHg. Aortic valve area, by VTI measures 0.70 cm. Pulmonic Valve: The pulmonic valve was not well visualized. Pulmonic valve regurgitation is not visualized. No evidence of pulmonic stenosis. Aorta: The aortic root and ascending aorta are structurally normal, with no evidence of dilitation. Pulmonary Artery: The pulmonary artery is not well seen. Venous: The inferior vena cava is normal in size with less than 50% respiratory variability, suggesting right atrial pressure of 8 mmHg. IAS/Shunts: No atrial level shunt detected by color flow Doppler.  LEFT VENTRICLE PLAX 2D LVIDd:         4.40 cm   Diastology LVIDs:         3.20 cm   LV e' medial:    6.09 cm/s LV PW:  1.01 cm   LV E/e' medial:  25.0 LV IVS:        1.10 cm   LV e' lateral:   7.51 cm/s LVOT diam:     1.80 cm    LV E/e' lateral: 20.2 LV SV:         57 LV SV Index:   35 LVOT Area:     2.54 cm  RIGHT VENTRICLE            IVC RV S prime:     9.79 cm/s  IVC diam: 2.00 cm TAPSE (M-mode): 2.0 cm LEFT ATRIUM             Index        RIGHT ATRIUM           Index LA diam:        3.80 cm 2.34 cm/m   RA Area:     12.30 cm LA Vol (A2C):   50.8 ml 31.30 ml/m  RA Volume:   30.40 ml  18.73 ml/m LA Vol (A4C):   50.1 ml 30.87 ml/m LA Biplane Vol: 50.5 ml 31.11 ml/m  AORTIC VALVE AV Area (Vmax):    0.68 cm AV Area (Vmean):   0.62 cm AV Area (VTI):     0.70 cm AV Vmax:           389.75 cm/s AV Vmean:          293.000 cm/s AV VTI:            0.822 m AV Peak Grad:      60.8 mmHg AV Mean Grad:      47.0 mmHg LVOT Vmax:         104.00 cm/s LVOT Vmean:        71.500 cm/s LVOT VTI:          0.225 m LVOT/AV VTI ratio: 0.27  AORTA Ao Root diam: 2.80 cm Ao Asc diam:  2.40 cm MITRAL VALVE                TRICUSPID VALVE MV Area (PHT): 5.62 cm     TR Peak grad:   26.0 mmHg MV Decel Time: 135 msec     TR Vmax:        255.00 cm/s MR Peak grad: 89.1 mmHg MR Vmax:      472.00 cm/s   SHUNTS MV E velocity: 152.00 cm/s  Systemic VTI:  0.22 m MV A velocity: 119.00 cm/s  Systemic Diam: 1.80 cm MV E/A ratio:  1.28 Sammy Crisp MD Electronically signed by Sammy Crisp MD Signature Date/Time: 07/06/2023/5:52:13 PM    Final    DG Chest Port 1 View Result Date: 07/06/2023 CLINICAL DATA:  Intubation. EXAM: PORTABLE CHEST 1 VIEW COMPARISON:  Radiographs 07/05/2023 and 07/04/2023.  CT 11/25/2022. FINDINGS: 0857 hours. Tip of the endotracheal tube is unchanged, projecting over the mid trachea. Enteric tube projects below the diaphragm, tip not visualized. Left axillary and brachial vascular stents are noted. The heart size and mediastinal contours are stable. Further improved aeration of the right lung base with persistent basilar opacity and a probable right pleural effusion. No evidence of pneumothorax. Multiple telemetry leads overlie the chest.  IMPRESSION: 1. Further improved aeration of the right lung base with persistent basilar opacity and probable right pleural effusion. 2. Stable support system. Electronically Signed   By: Elmon Hagedorn M.D.   On: 07/06/2023 12:53   DG Chest 1 View Result Date: 07/05/2023 CLINICAL DATA:  NG  placement. EXAM: CHEST  1 VIEW; ABDOMEN - 1 VIEW COMPARISON:  Chest radiograph dated 05/03/2024. FINDINGS: Endotracheal tube in stable position. Similar positioning of the enteric tube with tip in the region of the gastric fundus. Right mid to lower lung field airspace opacity as well as left lung infiltrate. No pneumothorax. Stable cardiomediastinal silhouette. No bowel dilatation or evidence of obstruction. No free air. Bilateral tubal ligation clips. No acute osseous pathology. IMPRESSION: 1. Enteric tube with tip in the region of the gastric fundus. 2. Bilateral lung infiltrates. Electronically Signed   By: Angus Bark M.D.   On: 07/05/2023 12:21   DG Abd 1 View Result Date: 07/05/2023 CLINICAL DATA:  NG placement. EXAM: CHEST  1 VIEW; ABDOMEN - 1 VIEW COMPARISON:  Chest radiograph dated 05/03/2024. FINDINGS: Endotracheal tube in stable position. Similar positioning of the enteric tube with tip in the region of the gastric fundus. Right mid to lower lung field airspace opacity as well as left lung infiltrate. No pneumothorax. Stable cardiomediastinal silhouette. No bowel dilatation or evidence of obstruction. No free air. Bilateral tubal ligation clips. No acute osseous pathology. IMPRESSION: 1. Enteric tube with tip in the region of the gastric fundus. 2. Bilateral lung infiltrates. Electronically Signed   By: Angus Bark M.D.   On: 07/05/2023 12:21   US  EKG SITE RITE Result Date: 07/05/2023 If Site Rite image not attached, placement could not be confirmed due to current cardiac rhythm.    Medications:    cefTRIAXone  (ROCEPHIN )  IV Stopped (07/06/23 0910)   fentaNYL  infusion INTRAVENOUS 50 mcg/hr  (07/07/23 0400)   heparin  900 Units/hr (07/07/23 0400)   norepinephrine  (LEVOPHED ) Adult infusion Stopped (07/06/23 2245)   promethazine  (PHENERGAN ) injection (IM or IVPB)     propofol  (DIPRIVAN ) infusion 25 mcg/kg/min (07/07/23 0800)    aspirin  EC  81 mg Oral Daily   atorvastatin   80 mg Per Tube QHS   carvedilol   25 mg Per Tube BID WC   Chlorhexidine  Gluconate Cloth  6 each Topical Daily   cloNIDine   0.2 mg Per Tube BID   docusate  100 mg Per Tube BID   ezetimibe   10 mg Per Tube Daily   fentaNYL  (SUBLIMAZE ) injection  50 mcg Intravenous Once   insulin  aspart  0-6 Units Subcutaneous Q4H   ipratropium-albuterol   3 mL Nebulization Q6H   multivitamin  1 tablet Per Tube QHS   mouth rinse  15 mL Mouth Rinse Q2H   pantoprazole  (PROTONIX ) IV  40 mg Intravenous Daily   polyethylene glycol  17 g Per Tube Daily   predniSONE   40 mg Per Tube Q breakfast   sevelamer  carbonate  0.8 g Per Tube TID WC   acetaminophen  **OR** acetaminophen , fentaNYL , labetalol , ondansetron  **OR** ondansetron  (ZOFRAN ) IV, mouth rinse, promethazine  (PHENERGAN ) injection (IM or IVPB)  Assessment/ Plan:  68 y.o. female with past medical history of diabetes mellitus type 2, peripheral neuropathy, GERD, hypertension, aortic stenosis, anemia of chronic kidney disease, peripheral arterial disease, ESRD on HD MWF who presented with shortness of breath and found to have RSV infection.  [facility]/MWF/[facility] [address]/left upper extremity AV graft  1.  ESRD on HD MWF.  Patient underwent hemodialysis treatment yesterday to make up for lack of treatment on Monday.  We are planning for dialysis treatment today as well.  2.  Acute respiratory failure secondary to RSV infection.  Still on ventilatory support.  Maintain the patient on vent support and weaning efforts as per pulmonary/critical care.  3.  Anemia of  chronic kidney disease.  Lab Results  Component Value Date   HGB 7.9 (L) 07/07/2023   Continue to trend CBC.  Consider  adding Epogen  if hemoglobin continues to trend down.  4.  Secondary hyperparathyroidism.  Maintain the patient on Renvela  0.8 g 3 times daily.   LOS: 3 Natanael Saladin 1/15/20258:10 AM

## 2023-07-07 NOTE — Progress Notes (Signed)
 Nutrition Follow Up Note   DOCUMENTATION CODES:   Not applicable  INTERVENTION:   If patient does not extubate:  Vital 1.2@50ml /hr- Initiate at 4ml/hr and increase by 10ml/hr q 8 hours until goal rate is reached.   ProSource TF 20- Give 60ml daily via tube, each supplement provides 80kcal and 20g of protein.   Free water  flushes 30ml q4 hours to maintain tube patency   Regimen provides 1520kcal/day, 110g/day protein and 1133ml/day of free water .   Rena-vit daily via tube   Daily weights  Pt at refeed risk; recommend monitor potassium, magnesium and phosphorus labs daily until stable  NUTRITION DIAGNOSIS:   Inadequate oral intake related to inability to eat (pt sedated and ventilated) as evidenced by NPO status. Ongoing   GOAL:   Provide needs based on ASPEN/SCCM guidelines -previously met with tube feeds   MONITOR:   Vent status, Labs, Weight trends, TF tolerance, I & O's, Skin  ASSESSMENT:   68 year old female patient with a past medical history of peripheral artery disease, DVT, type 2 diabetes mellitus, diabetic neuropathy, GERD, hypertension, aortic stenosis, anemia of CKD, end-stage renal disease on HD via left AV fistula MWF currently on the kidney transplant list at [facility] who is admitted with NSTEMI and acute hypoxic respiratory failure secondary to RSV pneumonia with possible superimposed bacterial pneumonia and mucous plugging.  Pt extubated to bipap yesterday, patient vomited and aspirated and had to be re-intubated. OGT replaced and is noted gastric; minimal output noted in canister today. KUB without any significant findings. Suspect vomiting was likely related to aerophagia from bipap. Pt undergoing SBT's today. Will plan to resume tube feeds if pt does not extubate. No BM since 1/12; pt is receiving bowel regimen. Per chart, pt is down ~4lbs since admission. Pt +2.0L on her I & Os.     Medications reviewed and include: aspirin , colace, insulin , rena-vit,  protonix , miralax , prednisone , renvela , heparin , zosyn    Labs reviewed: K 4.1 wnl, BUN 36(H), creat 8.0(H), P 6.3(H), Mg 2.2 wnl Wbc- 11.9(H), Hgb 7.9(L), Hct 24.7(L) Cbgs- 89. 92, 122 x 24 hrs   Patient is currently intubated on ventilator support MV: 6.7 L/min Temp (24hrs), Avg:98.4 F (36.9 C), Min:97.7 F (36.5 C), Max:99.1 F (37.3 C)  Propofol : 9.53 ml/hr- provides 251kcal/day   MAP >30mmHg   UOP- 50ml   Diet Order:   Diet Order             Diet NPO time specified  Diet effective now                  EDUCATION NEEDS:   No education needs have been identified at this time  Skin:  Skin Assessment: Reviewed RN Assessment  Last BM:  1/12  Height:   Ht Readings from Last 1 Encounters:  07/05/23 5\' 1"  (1.549 m)    Weight:   Wt Readings from Last 1 Encounters:  07/07/23 60.9 kg    Ideal Body Weight:  47.7 kg  BMI:  Body mass index is 25.37 kg/m.  Estimated Nutritional Needs:   Kcal:  1400-1600kcal/day  Protein:  95-110g/day  Fluid:  UOP +1L  Torrance Freestone MS, RD, LDN If unable to be reached, please send secure chat to "RD inpatient" available from 8:00a-4:00p daily

## 2023-07-07 NOTE — Progress Notes (Signed)
 PHARMACY NOTE:  ANTIMICROBIAL RENAL DOSAGE ADJUSTMENT  Current antimicrobial regimen includes a mismatch between antimicrobial dosage and estimated renal function.  As per policy approved by the Pharmacy & Therapeutics and Medical Executive Committees, the antimicrobial dosage will be adjusted accordingly.  Current antimicrobial dosage:  Zosyn  3.375 g IV q8H  Indication: Aspiration pneumonia  Renal Function:  Estimated Creatinine Clearance: 7.7 mL/min (A) (by C-G formula based on SCr of 5.86 mg/dL (H)). [x]      On intermittent HD, scheduled: MWF []      On CRRT    Antimicrobial dosage has been changed to:  Zosyn  2.25 g IV q8H  Additional comments:   Thank you for allowing pharmacy to be a part of this patient's care.  Will M. Alva Jewels, PharmD Clinical Pharmacist 07/07/2023 8:31 AM

## 2023-07-07 NOTE — Progress Notes (Signed)
 Hemodialysis Note:  Received patient in bed . Oriented to voice. Informed consent singed and in chart.  Treatment initiated: 1445 Treatment completed: 1815  Access used: Right Femoral Catheter Access issues: None  Patient tolerated well. Report given to patient's RN.  Total UF removed: 400 ml Medications given: None Post HD VS:  Post HD weight: unable to get weight, weighing scale not working  [facility]

## 2023-07-08 ENCOUNTER — Inpatient Hospital Stay: Payer: Medicare PPO

## 2023-07-08 ENCOUNTER — Encounter: Payer: Self-pay | Admitting: Internal Medicine

## 2023-07-08 DIAGNOSIS — J189 Pneumonia, unspecified organism: Secondary | ICD-10-CM

## 2023-07-08 DIAGNOSIS — I214 Non-ST elevation (NSTEMI) myocardial infarction: Secondary | ICD-10-CM

## 2023-07-08 DIAGNOSIS — R0603 Acute respiratory distress: Secondary | ICD-10-CM

## 2023-07-08 DIAGNOSIS — I2510 Atherosclerotic heart disease of native coronary artery without angina pectoris: Secondary | ICD-10-CM | POA: Diagnosis not present

## 2023-07-08 DIAGNOSIS — J9601 Acute respiratory failure with hypoxia: Secondary | ICD-10-CM | POA: Diagnosis not present

## 2023-07-08 DIAGNOSIS — R918 Other nonspecific abnormal finding of lung field: Secondary | ICD-10-CM | POA: Diagnosis not present

## 2023-07-08 DIAGNOSIS — I350 Nonrheumatic aortic (valve) stenosis: Secondary | ICD-10-CM | POA: Diagnosis not present

## 2023-07-08 DIAGNOSIS — N186 End stage renal disease: Secondary | ICD-10-CM

## 2023-07-08 DIAGNOSIS — R0902 Hypoxemia: Secondary | ICD-10-CM

## 2023-07-08 DIAGNOSIS — Z992 Dependence on renal dialysis: Secondary | ICD-10-CM

## 2023-07-08 DIAGNOSIS — A419 Sepsis, unspecified organism: Principal | ICD-10-CM

## 2023-07-08 DIAGNOSIS — R000 Tachycardia, unspecified: Secondary | ICD-10-CM | POA: Diagnosis not present

## 2023-07-08 LAB — CBC
HCT: 29 % — ABNORMAL LOW (ref 36.0–46.0)
Hemoglobin: 8.9 g/dL — ABNORMAL LOW (ref 12.0–15.0)
MCH: 30.5 pg (ref 26.0–34.0)
MCHC: 30.7 g/dL (ref 30.0–36.0)
MCV: 99.3 fL (ref 80.0–100.0)
Platelets: 275 10*3/uL (ref 150–400)
RBC: 2.92 MIL/uL — ABNORMAL LOW (ref 3.87–5.11)
RDW: 14.6 % (ref 11.5–15.5)
WBC: 12.3 10*3/uL — ABNORMAL HIGH (ref 4.0–10.5)
nRBC: 0 % (ref 0.0–0.2)

## 2023-07-08 LAB — BLOOD GAS, ARTERIAL
Acid-Base Excess: 1 mmol/L (ref 0.0–2.0)
Acid-base deficit: 0.9 mmol/L (ref 0.0–2.0)
Bicarbonate: 25.8 mmol/L (ref 20.0–28.0)
Bicarbonate: 27.1 mmol/L (ref 20.0–28.0)
Delivery systems: POSITIVE
Expiratory PAP: 5 cm[H2O]
FIO2: 50 %
Inspiratory PAP: 15 cm[H2O]
O2 Content: 15 L/min
O2 Saturation: 96.6 %
O2 Saturation: 96.7 %
Patient temperature: 37
Patient temperature: 37
pCO2 arterial: 50 mm[Hg] — ABNORMAL HIGH (ref 32–48)
pCO2 arterial: 48 mm[Hg] (ref 32–48)
pH, Arterial: 7.32 — ABNORMAL LOW (ref 7.35–7.45)
pH, Arterial: 7.36 (ref 7.35–7.45)
pO2, Arterial: 96 mm[Hg] (ref 83–108)
pO2, Arterial: 94 mm[Hg] (ref 83–108)

## 2023-07-08 LAB — GLUCOSE, CAPILLARY
Glucose-Capillary: 104 mg/dL — ABNORMAL HIGH (ref 70–99)
Glucose-Capillary: 122 mg/dL — ABNORMAL HIGH (ref 70–99)
Glucose-Capillary: 110 mg/dL — ABNORMAL HIGH (ref 70–99)
Glucose-Capillary: 79 mg/dL (ref 70–99)
Glucose-Capillary: 108 mg/dL — ABNORMAL HIGH (ref 70–99)
Glucose-Capillary: 92 mg/dL (ref 70–99)

## 2023-07-08 LAB — RENAL FUNCTION PANEL
Albumin: 3 g/dL — ABNORMAL LOW (ref 3.5–5.0)
Anion gap: 13 (ref 5–15)
BUN: 27 mg/dL — ABNORMAL HIGH (ref 8–23)
CO2: 24 mmol/L (ref 22–32)
Calcium: 8.8 mg/dL — ABNORMAL LOW (ref 8.9–10.3)
Chloride: 97 mmol/L — ABNORMAL LOW (ref 98–111)
Creatinine, Ser: 4.01 mg/dL — ABNORMAL HIGH (ref 0.44–1.00)
GFR, Estimated: 12 mL/min — ABNORMAL LOW (ref >60)
Glucose, Bld: 139 mg/dL — ABNORMAL HIGH (ref 70–99)
Phosphorus: 5.6 mg/dL — ABNORMAL HIGH (ref 2.5–4.6)
Potassium: 4.3 mmol/L (ref 3.5–5.1)
Sodium: 134 mmol/L — ABNORMAL LOW (ref 135–145)

## 2023-07-08 LAB — TROPONIN I (HIGH SENSITIVITY)
Troponin I (High Sensitivity): 1575 ng/L (ref <18)
Troponin I (High Sensitivity): 2798 ng/L (ref <18)
Troponin I (High Sensitivity): 5054 ng/L (ref <18)
Troponin I (High Sensitivity): 3079 ng/L (ref <18)

## 2023-07-08 LAB — HEPARIN LEVEL (UNFRACTIONATED): Heparin Unfractionated: 0.51 [IU]/mL (ref 0.30–0.70)

## 2023-07-08 LAB — CULTURE, RESPIRATORY W GRAM STAIN: Culture: NORMAL

## 2023-07-08 LAB — LIPASE, BLOOD: Lipase: 19 U/L (ref 11–51)

## 2023-07-08 LAB — LACTIC ACID, PLASMA
Lactic Acid, Venous: 0.9 mmol/L (ref 0.5–1.9)
Lactic Acid, Venous: 0.8 mmol/L (ref 0.5–1.9)

## 2023-07-08 LAB — MAGNESIUM: Magnesium: 2.1 mg/dL (ref 1.7–2.4)

## 2023-07-08 LAB — HEPATIC FUNCTION PANEL
ALT: 12 U/L (ref 0–44)
AST: 27 U/L (ref 15–41)
Albumin: 2.8 g/dL — ABNORMAL LOW (ref 3.5–5.0)
Alkaline Phosphatase: 50 U/L (ref 38–126)
Bilirubin, Direct: 0.1 mg/dL (ref 0.0–0.2)
Indirect Bilirubin: 0.7 mg/dL (ref 0.3–0.9)
Total Bilirubin: 0.8 mg/dL (ref 0.0–1.2)
Total Protein: 5.8 g/dL — ABNORMAL LOW (ref 6.5–8.1)

## 2023-07-08 LAB — HEPATITIS B SURFACE ANTIBODY, QUANTITATIVE: Hep B S AB Quant (Post): 4.4 m[IU]/mL — ABNORMAL LOW

## 2023-07-08 MED ORDER — HEPARIN SODIUM (PORCINE) 1000 UNIT/ML DIALYSIS: 1000.0000 [IU] | INTRAMUSCULAR | Status: DC | PRN

## 2023-07-08 MED ORDER — FLUMAZENIL 0.5 MG/5ML IV SOLN
INTRAVENOUS | Status: AC
  Administered 2023-07-08: 0.2 mg via INTRAVENOUS
  Filled 2023-07-08: qty 5

## 2023-07-08 MED ORDER — HYDRALAZINE HCL 20 MG/ML IJ SOLN
10.0000 mg | INTRAMUSCULAR | Status: DC | PRN
  Administered 2023-07-08 (×2): 20 mg via INTRAVENOUS
  Filled 2023-07-08 (×3): qty 1

## 2023-07-08 MED ORDER — IPRATROPIUM-ALBUTEROL 0.5-2.5 (3) MG/3ML IN SOLN
3.0000 mL | Freq: Four times a day (QID) | RESPIRATORY_TRACT | Status: DC
  Administered 2023-07-08: 3 mL via RESPIRATORY_TRACT

## 2023-07-08 MED ORDER — FLUMAZENIL 0.5 MG/5ML IV SOLN: 0.2000 mg | Freq: Once | INTRAVENOUS | Status: AC

## 2023-07-08 MED ORDER — ALBUTEROL SULFATE (2.5 MG/3ML) 0.083% IN NEBU
5.0000 mg | INHALATION_SOLUTION | Freq: Once | RESPIRATORY_TRACT | Status: AC
  Administered 2023-07-08: 5 mg via RESPIRATORY_TRACT

## 2023-07-08 MED ORDER — IPRATROPIUM-ALBUTEROL 0.5-2.5 (3) MG/3ML IN SOLN
3.0000 mL | Freq: Once | RESPIRATORY_TRACT | Status: AC
  Administered 2023-07-08: 3 mL via RESPIRATORY_TRACT

## 2023-07-08 MED ORDER — ETOMIDATE 2 MG/ML IV SOLN
INTRAVENOUS | Status: AC
  Filled 2023-07-08: qty 10

## 2023-07-08 MED ORDER — NITROGLYCERIN IN D5W 200-5 MCG/ML-% IV SOLN
0.0000 ug/min | INTRAVENOUS | Status: DC
  Administered 2023-07-08: 5 ug/min via INTRAVENOUS
  Administered 2023-07-09: 40 ug/min via INTRAVENOUS
  Filled 2023-07-08 (×2): qty 250

## 2023-07-08 MED ORDER — METOPROLOL TARTRATE 5 MG/5ML IV SOLN
2.5000 mg | INTRAVENOUS | Status: AC
  Administered 2023-07-08: 2.5 mg via INTRAVENOUS

## 2023-07-08 MED ORDER — DIAZEPAM 5 MG/ML IJ SOLN
INTRAMUSCULAR | Status: AC
  Filled 2023-07-08: qty 2

## 2023-07-08 MED ORDER — IPRATROPIUM-ALBUTEROL 0.5-2.5 (3) MG/3ML IN SOLN
3.0000 mL | RESPIRATORY_TRACT | Status: DC
Start: 2023-07-08 — End: 2023-07-09
  Administered 2023-07-08 – 2023-07-09 (×5): 3 mL via RESPIRATORY_TRACT
  Filled 2023-07-08 (×5): qty 3

## 2023-07-08 MED ORDER — DIAZEPAM 5 MG/ML IJ SOLN
2.5000 mg | Freq: Once | INTRAMUSCULAR | Status: AC
  Administered 2023-07-08: 2.5 mg via INTRAVENOUS

## 2023-07-08 MED ORDER — FUROSEMIDE 10 MG/ML IJ SOLN
40.0000 mg | Freq: Once | INTRAMUSCULAR | Status: AC
  Administered 2023-07-08: 40 mg via INTRAVENOUS

## 2023-07-08 MED ORDER — FENTANYL CITRATE PF 50 MCG/ML IJ SOSY
PREFILLED_SYRINGE | INTRAMUSCULAR | Status: AC
  Filled 2023-07-08: qty 1

## 2023-07-08 MED ORDER — ROCURONIUM BROMIDE 10 MG/ML (PF) SYRINGE
PREFILLED_SYRINGE | INTRAVENOUS | Status: AC
  Filled 2023-07-08: qty 10

## 2023-07-08 MED ORDER — ONDANSETRON HCL 4 MG/2ML IJ SOLN
4.0000 mg | Freq: Once | INTRAMUSCULAR | Status: AC
  Administered 2023-07-08: 4 mg via INTRAVENOUS

## 2023-07-08 MED ORDER — ALBUTEROL SULFATE (2.5 MG/3ML) 0.083% IN NEBU
INHALATION_SOLUTION | RESPIRATORY_TRACT | Status: AC
  Filled 2023-07-08: qty 6

## 2023-07-08 MED ORDER — MORPHINE SULFATE (PF) 2 MG/ML IV SOLN
2.0000 mg | Freq: Once | INTRAVENOUS | Status: AC
Start: 2023-07-08 — End: 2023-07-08
  Administered 2023-07-08: 2 mg via INTRAVENOUS
  Filled 2023-07-08: qty 1

## 2023-07-08 MED ORDER — ACETAMINOPHEN 10 MG/ML IV SOLN
1000.0000 mg | Freq: Once | INTRAVENOUS | Status: AC
  Administered 2023-07-08: 1000 mg via INTRAVENOUS
  Filled 2023-07-08: qty 100

## 2023-07-08 MED ORDER — MENTHOL 3 MG MT LOZG
1.0000 | LOZENGE | OROMUCOSAL | Status: DC | PRN
  Filled 2023-07-08: qty 9

## 2023-07-08 MED ORDER — FENTANYL CITRATE PF 50 MCG/ML IJ SOSY
12.5000 ug | PREFILLED_SYRINGE | Freq: Once | INTRAMUSCULAR | Status: AC
  Administered 2023-07-08: 12.5 ug via INTRAVENOUS
  Filled 2023-07-08: qty 1

## 2023-07-08 MED ORDER — ALTEPLASE 2 MG IJ SOLR: 2.0000 mg | Freq: Once | INTRAMUSCULAR | Status: DC | PRN

## 2023-07-08 NOTE — Progress Notes (Signed)
NAME:  Christy Curry, MRN:  [id number], DOB:  [birth year], LOS: 4 ADMISSION DATE:  07/04/2023 History of Present Illness:    Case of 68 year old female patient with a past medical history of peripheral artery disease, type 2 diabetes mellitus, diabetic neuropathy, GERD, hypertension, aortic stenosis, anemia of CKD, end-stage renal disease on HD via left AV fistula MWF currently on the kidney transplant list at [facility] who presented to [facility] on 01/12 with shortness of breath.   History taken mostly from chart review.   She presents from home with weakness, shortness of breath, cough for couple of days.  Initially on BiPAP with SpO2 of 100%.  Hypertensive and tachycardic.  Labs with leukocytosis neutrophilic predominant 21.6.  H&H stable.  Electrolytes unremarkable.  Creatinine 7.7 g/dL and BUN 28.  Troponin was noted to be elevated initially at 100 and subsequently at 795.  Over the course of the night her respiratory status got worse required intubation and mechanical ventilation.   Chest x-ray with multifocal pneumonia.  Chest x-ray postintubation with possible mucous plugging of the right upper lobe.  RSV positive.    Pertinent  Medical History  -- Hypertension on Amlodipine, Carvedilol, Clonidine, losartan -- Type II DM Glipizide -- HLD on atorvastatin and ezetimibe -- GERD on PPI  -- ESRD on HD, Sevelamer  Significant Hospital Events: Including procedures, antibiotic start and stop dates in addition to other pertinent events   07/04/2022 Intubation and MV, s/p bronchoscopy and airway clearance.  07/05/2022 Extubated but unfortunately had a multiple vomiting events through the day and night --> significant aspiration event requiring reintubation 07/05/2022 at night.  07/06/2022 Tolerated SBT well. This AM and overnight, she went into moderate respiratory distress. Requiring bipap support. Appears to be doing well on bipap.  Interim History / Subjective:  Awake alert and following  commands. Appears to be doing well on bipap.   Objective   Blood pressure (!) 142/76, pulse 98, temperature 98.3 F (36.8 C), temperature source Axillary, resp. rate 15, height 5\' 1"  (1.549 m), weight 61.4 kg, SpO2 98%.    Vent Mode: Spontaneous FiO2 (%):  [30 %-50 %] 45 % Set Rate:  [18 bmp] 18 bmp Vt Set:  [370 mL] 370 mL PEEP:  [5 cmH20] 5 cmH20 Pressure Support:  [5 cmH20-10 cmH20] 10 cmH20 Plateau Pressure:  [15 cmH20] 15 cmH20   Intake/Output Summary (Last 24 hours) at 07/08/2023 0847 Last data filed at 07/08/2023 0800 Gross per 24 hour  Intake 629.55 ml  Output 625 ml  Net 4.55 ml   Filed Weights   07/06/23 1221 07/07/23 0330 07/08/23 0345  Weight: 63.5 kg 60.9 kg 61.4 kg    Examination: GEN On Bipap, awake and alert. Comfortable on Bipap.  HEENT supple neck, reactive pupils CVS loud systolic murmur heard best at the right sternal border Lungs diminished over the right side.  Diffuse rhonchi. Abdomen soft nontender nondistended positive bowel sounds Extremities warm well-perfused no edema   Labs and imaging were reviewed  Back to minimal vent settings.   Assessment & Plan:  Case of 68 year old female patient with a past medical history of peripheral artery disease, type 2 diabetes mellitus, diabetic neuropathy, GERD, hypertension, aortic stenosis, anemia of CKD, end-stage renal disease on HD via left AV fistula MWF currently on the kidney transplant list at [facility] who presented to [facility] on 01/12 with shortness of breath.  Found to be in acute hypoxic respiratory failure requiring intubation and mechanical ventilation secondary to RSV pneumonia  possible superimposed bacterial pneumonia and mucous plugging.   #Acute hypoxic respiratory failure requiring intubation and mechanical ventilation 07/05/2023 secondary to.... #RSV pneumonia with possible superimposed bacterial pneumonia and mucous plugging s/p extubation 07/05/2022 with course complicated by aspiration event  requring re-intubation 07/05/2022 at night and now re-extubated 07/06/2022.  #Type II NSTEMI secondary to demand #History of end-stage renal disease on HD MWF via left AV fistula #Severe aortic stenosis with AV area surface 0.68 cm. MG 47 mmHg.  #Hypertension, hyperlipidemia and type 2 diabetes mellitus   She is improving from a respiratory standpoint however she is still having these episodes of respiratory distress I think majorly driven by her severe aortic stenosis with area 0.6 cm2 and MG 57. Will consult cardiology and start UF today.   Neuro: No issues  CVS: Goal MAP > = 65. C/w home clonidine and carvedilol. Hold home losartan. Cardiology consult.  Lungs: MRSA swab -, RSV +, Trach aspirate remains negative. Completed Azithromycin. Switch Ceftriaxone to Zosyn given inhospital aspiration event. Prednisone 40mg  daily (D4) per CAPE COD.  GI: Clear liquid diet. PPI daily. Lax as needed.   Renal: Renal onboard. UF today.  c/w home sevelamer.  Heme: Heparin drip  Endo: ISS POC goal 140-180   Best Practice (right click and "Reselect all SmartList Selections" daily)   Diet/type: NPO now but can trial diet when tolerated.  DVT prophylaxis systemic heparin Pressure ulcer(s): N/A GI prophylaxis: PPI Lines: Dialysis Catheter Foley:  N/A Code Status:  full code  Last date of multidisciplinary goals of care discussion [07/06/2023]  I spent 40 minutes caring for this patient today, including preparing to see the patient, obtaining a medical history , reviewing a separately obtained history, performing a medically appropriate examination and/or evaluation, ordering medications, tests, or procedures, documenting clinical information in the electronic health record, and independently interpreting results (not separately reported/billed) and communicating results to the patient/family/caregiver  Janann Colonel, MD [facility] 07/08/2023 8:47 AM

## 2023-07-08 NOTE — Progress Notes (Signed)
SLP Cancellation Note  Patient Details Name: Christy Curry MRN: [id number] DOB: 1955/11/07   Cancelled treatment:       Reason Eval/Treat Not Completed: Patient not medically ready. Orders received for bedside swallow evaluation. Pt is currently requiring BiPAP; therefore, is not medically ready for swallow assessment at this time. SLP will monitor for pt's readiness for assessment. MD and RN aware of plan.   [place] Dominion Kathan Clapp  MS [facility] SLP   [place] J Clapp 07/08/2023, 11:37 AM

## 2023-07-08 NOTE — Progress Notes (Signed)
Hemodialysis Note:  Received patient in bed. Oriented to person Informed consent singed and in chart.  Treatment initiated: 1442 Treatment completed: 1800  Access used: Right Femoral Catheter Access issues: None  Patient tolerated well. Report given to patient's RN.  Total UF removed: 1L Medications given: None  Post HD weight: unable to get weight, bed scale not working  [facility]

## 2023-07-08 NOTE — Progress Notes (Addendum)
Dr. Birdie Sons notified of troponin of 1575. No new orders received

## 2023-07-08 NOTE — Progress Notes (Signed)
Nutrition Follow Up Note   DOCUMENTATION CODES:   Not applicable  INTERVENTION:   RD will add supplements with diet advancement   Rena-vit po daily with diet advancement   Daily weights  Pt remains at refeed risK  Bowel regimen per MD  NUTRITION DIAGNOSIS:   Inadequate oral intake related to inability to eat (pt sedated and ventilated) as evidenced by NPO status. -ongoing   GOAL:   Patient will meet greater than or equal to 90% of their needs -previously met with tube feeds   MONITOR:   Diet advancement, Labs, Weight trends, I & O's, Skin  ASSESSMENT:   68 year old female patient with a past medical history of peripheral artery disease, DVT, type 2 diabetes mellitus, diabetic neuropathy, GERD, hypertension, aortic stenosis, anemia of CKD, end-stage renal disease on HD via left AV fistula MWF currently on the kidney transplant list at [facility] who is admitted with NSTEMI and acute hypoxic respiratory failure secondary to RSV pneumonia with possible superimposed bacterial pneumonia and mucous plugging.  Pt extubated to bipap yesterday; pt remains on bipap. Pt with nausea today; KUB with no significant findings but is noted to have moderate stool throughout the colon. Last BM was 1/12. Recommend bowel regimen per MD. SLP evaluation once appropriate. RD will add supplements with diet advancement. Per chart, pt is weight stable since admission and appears to be at her UBW. Pt +2.0L on her I & Os.    Medications reviewed and include: aspirin, insulin, rena-vit, protonix, prednisone, renvela, heparin, zosyn   Labs reviewed: Na 134(L), K 4.3 wnl, BUN 27(H), creat 4.01(H), P 5.6(H), Mg 2.1 wnl Wbc- 12.3(H), Hgb 8.9(L), Hct 29.0(L) Cbgs- 110, 122, 104 x 24 hrs   UOP- 75ml   Diet Order:   Diet Order             Diet clear liquid Room service appropriate? Yes; Fluid consistency: Thin  Diet effective now                  EDUCATION NEEDS:   No education needs have been  identified at this time  Skin:  Skin Assessment: Reviewed RN Assessment  Last BM:  1/12  Height:   Ht Readings from Last 1 Encounters:  07/05/23 5\' 1"  (1.549 m)    Weight:   Wt Readings from Last 1 Encounters:  07/08/23 61.4 kg    Ideal Body Weight:  47.7 kg  BMI:  Body mass index is 25.58 kg/m.  Estimated Nutritional Needs:   Kcal:  1600-1800kcal/day  Protein:  80-90g/day  Fluid:  UOP +1L  Betsey Holiday MS, RD, LDN If unable to be reached, please send secure chat to "RD inpatient" available from 8:00a-4:00p daily

## 2023-07-08 NOTE — Progress Notes (Signed)
[facility]  ROUNDING NOTE   Subjective:   Patient was extubated yesterday. However now back on BiPAP.   Objective:  Vital signs in last 24 hours:  Temp:  [97.8 F (36.6 C)-99.1 F (37.3 C)] 99.1 F (37.3 C) (01/16 0400) Pulse Rate:  [71-110] 99 (01/16 0700) Resp:  [12-34] 26 (01/16 0700) BP: (122-195)/(45-115) 149/73 (01/16 0700) SpO2:  [92 %-100 %] 99 % (01/16 0700) FiO2 (%):  [30 %-50 %] 45 % (01/16 0739) Weight:  [61.4 kg] 61.4 kg (01/16 0345)  Weight change: -2.6 kg Filed Weights   07/06/23 1221 07/07/23 0330 07/08/23 0345  Weight: 63.5 kg 60.9 kg 61.4 kg    Intake/Output: I/O last 3 completed shifts: In: 948.1 [I.V.:485.1; NG/GT:125; IV Piggyback:338.1] Out: 675 [Urine:125; Emesis/NG output:150; Other:400]   Intake/Output this shift:  No intake/output data recorded.  Physical Exam: General: Currently on BiPAP  Head: Normocephalic, atraumatic.  Hearing intact  Neck: Supple  Lungs:  Scattered rhonchi, on BiPAP  Heart: S1S2 no rubs  Abdomen:  Soft, nontender, bowel sounds present  Extremities: Trace peripheral edema.  Neurologic: Awake, alert  Skin: No acute rash  Access: Left upper extremity AV graft, right femoral temporary dialysis catheter    Basic Metabolic Panel: Recent Labs  Lab 07/04/23 1348 07/05/23 0403 07/06/23 0403 07/07/23 0404 07/08/23 0502  NA 140 144 141 136 134*  K 3.8 3.7 4.1 4.1 4.3  CL 106 104 104 99 97*  CO2 19* 23 23 26 24   GLUCOSE 171* 188* 150* 134* 139*  BUN 28* 35* 47* 36* 27*  CREATININE 7.07* 8.01* 9.09* 5.86* 4.01*  CALCIUM 9.1 8.8* 8.0* 8.0* 8.8*  MG 2.5*  --  2.2 2.2 2.1  PHOS  --   --  6.7* 6.3* 5.6*    Liver Function Tests: Recent Labs  Lab 07/04/23 1348 07/06/23 0403 07/07/23 0404 07/08/23 0502  AST 23  --  15  --   ALT 13  --  11  --   ALKPHOS 81  --  53  --   BILITOT 0.7  --  0.6  --   PROT 7.2  --  5.5*  --   ALBUMIN 3.9 2.7* 2.6*  2.6* 3.0*   Recent Labs  Lab 07/04/23 1348   LIPASE 21   No results for input(s): "AMMONIA" in the last 168 hours.  CBC: Recent Labs  Lab 07/04/23 1348 07/05/23 0403 07/06/23 0403 07/06/23 1302 07/07/23 0404 07/08/23 0502  WBC 21.6* 13.5* 13.2* 12.3* 11.9* 12.3*  NEUTROABS 18.5*  --  11.6*  --  10.3*  --   HGB 11.9* 10.0* 7.9* 7.8* 7.9* 8.9*  HCT 40.5 31.6* 24.5* 24.2* 24.7* 29.0*  MCV 106.9* 97.5 97.2 96.8 96.9 99.3  PLT 295 264 204 212 220 275    Cardiac Enzymes: No results for input(s): "CKTOTAL", "CKMB", "CKMBINDEX", "TROPONINI" in the last 168 hours.  BNP: Invalid input(s): "POCBNP"  CBG: Recent Labs  Lab 07/07/23 1644 07/07/23 1925 07/07/23 2306 07/08/23 0305 07/08/23 0754  GLUCAP 121* 116* 102* 104* 122*    Microbiology: Results for orders placed or performed during the hospital encounter of 07/04/23  Resp panel by RT-PCR (RSV, Flu A&B, Covid) Anterior Nasal Swab     Status: Abnormal   Collection Time: 07/04/23  1:48 PM   Specimen: Anterior Nasal Swab  Result Value Ref Range Status   SARS Coronavirus 2 by RT PCR NEGATIVE NEGATIVE Final    Comment: (NOTE) SARS-CoV-2 target nucleic acids are NOT DETECTED.  The SARS-CoV-2 RNA is generally detectable in upper respiratory specimens during the acute phase of infection. The lowest concentration of SARS-CoV-2 viral copies this assay can detect is 138 copies/mL. A negative result does not preclude SARS-Cov-2 infection and should not be used as the sole basis for treatment or other patient management decisions. A negative result may occur with  improper specimen collection/handling, submission of specimen other than nasopharyngeal swab, presence of viral mutation(s) within the areas targeted by this assay, and inadequate number of viral copies(<138 copies/mL). A negative result must be combined with clinical observations, patient history, and epidemiological information. The expected result is Negative.  Fact Sheet for Patients:   BloggerCourse.com  Fact Sheet for Healthcare Providers:  SeriousBroker.it  This test is no t yet approved or cleared by the [place] FDA and  has been authorized for detection and/or diagnosis of SARS-CoV-2 by FDA under an Emergency Use Authorization (EUA). This EUA will remain  in effect (meaning this test can be used) for the duration of the COVID-19 declaration under Section 564(b)(1) of the Act, 21 U.S.C.section 360bbb-3(b)(1), unless the authorization is terminated  or revoked sooner.       Influenza A by PCR NEGATIVE NEGATIVE Final   Influenza B by PCR NEGATIVE NEGATIVE Final    Comment: (NOTE) The Xpert Xpress SARS-CoV-2/FLU/RSV plus assay is intended as an aid in the diagnosis of influenza from Nasopharyngeal swab specimens and should not be used as a sole basis for treatment. Nasal washings and aspirates are unacceptable for Xpert Xpress SARS-CoV-2/FLU/RSV testing.  Fact Sheet for Patients: BloggerCourse.com  Fact Sheet for Healthcare Providers: SeriousBroker.it  This test is not yet approved or cleared by the [place] FDA and has been authorized for detection and/or diagnosis of SARS-CoV-2 by FDA under an Emergency Use Authorization (EUA). This EUA will remain in effect (meaning this test can be used) for the duration of the COVID-19 declaration under Section 564(b)(1) of the Act, 21 U.S.C. section 360bbb-3(b)(1), unless the authorization is terminated or revoked.     Resp Syncytial Virus by PCR POSITIVE (A) NEGATIVE Final    Comment: (NOTE) Fact Sheet for Patients: BloggerCourse.com  Fact Sheet for Healthcare Providers: SeriousBroker.it  This test is not yet approved or cleared by the [place] FDA and has been authorized for detection and/or diagnosis of SARS-CoV-2 by FDA under an Emergency Use  Authorization (EUA). This EUA will remain in effect (meaning this test can be used) for the duration of the COVID-19 declaration under Section 564(b)(1) of the Act, 21 U.S.C. section 360bbb-3(b)(1), unless the authorization is terminated or revoked.  Performed at [facility], [address] [place], [place] [address]   Blood Culture (routine x 2)     Status: None (Preliminary result)   Collection Time: 07/04/23  3:41 PM   Specimen: BLOOD  Result Value Ref Range Status   Specimen Description BLOOD BLOOD RIGHT WRIST  Final   Special Requests   Final    BOTTLES DRAWN AEROBIC AND ANAEROBIC Blood Culture adequate volume   Culture   Final    NO GROWTH 3 DAYS Performed at [facility], [address] [place], [place] [address]    Report Status PENDING  Incomplete  Blood Culture (routine x 2)     Status: None (Preliminary result)   Collection Time: 07/04/23  3:41 PM   Specimen: BLOOD  Result Value Ref Range Status   Specimen Description BLOOD BLOOD RIGHT HAND  Final   Special Requests  Final    BOTTLES DRAWN AEROBIC AND ANAEROBIC Blood Culture adequate volume   Culture   Final    NO GROWTH 3 DAYS Performed at [facility], [address] [place], [place] [address]    Report Status PENDING  Incomplete  MRSA Next Gen by PCR, Nasal     Status: None   Collection Time: 07/04/23  6:20 PM   Specimen: Nasal Mucosa; Nasal Swab  Result Value Ref Range Status   MRSA by PCR Next Gen NOT DETECTED NOT DETECTED Final    Comment: (NOTE) The GeneXpert MRSA Assay (FDA approved for NASAL specimens only), is one component of a comprehensive MRSA colonization surveillance program. It is not intended to diagnose MRSA infection nor to guide or monitor treatment for MRSA infections. Test performance is not FDA approved in patients less than 46 years old. Performed at [facility], [address] [place], [place] [address]   Culture, Respiratory w Gram  Stain     Status: None (Preliminary result)   Collection Time: 07/05/23  8:07 AM   Specimen: Tracheal Aspirate; Respiratory  Result Value Ref Range Status   Specimen Description   Final    TRACHEAL ASPIRATE Performed at [facility], [address] [place], [place] [address]    Special Requests   Final    NONE Performed at [facility], [address] [place], [place] [address]    Gram Stain   Final    MODERATE WBC PRESENT, PREDOMINANTLY PMN RARE GRAM POSITIVE COCCI IN PAIRS    Culture   Final    NO GROWTH 2 DAYS Performed at [facility], [address] [place], [place] [address]    Report Status PENDING  Incomplete  Culture, Respiratory w Gram Stain     Status: None (Preliminary result)   Collection Time: 07/05/23  3:26 PM   Specimen: Bronchoalveolar Lavage; Respiratory  Result Value Ref Range Status   Specimen Description   Final    BRONCHIAL ALVEOLAR LAVAGE Performed at [facility], [address] [place], [place] [address]    Special Requests   Final    NONE Performed at [facility], [address] [place], [place] [address]    Gram Stain   Final    FEW WBC PRESENT, PREDOMINANTLY PMN NO ORGANISMS SEEN    Culture   Final    NO GROWTH 2 DAYS Performed at [facility], [address], [place], [place] [address]    Report Status PENDING  Incomplete    Coagulation Studies: No results for input(s): "LABPROT", "INR" in the last 72 hours.   Urinalysis: No results for input(s): "COLORURINE", "LABSPEC", "PHURINE", "GLUCOSEU", "HGBUR", "BILIRUBINUR", "KETONESUR", "PROTEINUR", "UROBILINOGEN", "NITRITE", "LEUKOCYTESUR" in the last 72 hours.  Invalid input(s): "APPERANCEUR"     Imaging: DG Chest Port 1 View Result Date: 07/08/2023 CLINICAL DATA:  68 year old female with history of hypoxia. EXAM: PORTABLE CHEST 1 VIEW COMPARISON:  Chest x-ray 07/06/2023. FINDINGS: Patient has been extubated. Nasogastric tube has  been removed. Lung volumes are very low. Diffuse interstitial prominence, widespread peribronchial cuffing and patchy multifocal airspace consolidation noted throughout the lungs bilaterally, most confluent throughout the right mid to lower lung and left lung base. Overall, aeration has substantially improved compared to the prior study, particularly in the region of the right upper lobe. Small bilateral pleural effusions. No pneumothorax. Heart size appears borderline enlarged. Upper mediastinal contours are within normal limits. Atherosclerotic calcifications in the thoracic aorta.  Vascular stent noted in the left axillary/subclavian region. IMPRESSION: 1. Support apparatus, as above. 2. Severe but improving multilobar bilateral pneumonia, as above. 3. Small bilateral pleural effusions. 4. Aortic atherosclerosis. Electronically Signed   By: Trudie Reed M.D.   On: 07/08/2023 05:20   CT ABDOMEN PELVIS W CONTRAST Result Date: 07/06/2023 CLINICAL DATA:  Aspiration possible bowel obstruction vomiting EXAM: CT ABDOMEN AND PELVIS WITH CONTRAST TECHNIQUE: Multidetector CT imaging of the abdomen and pelvis was performed using the standard protocol following bolus administration of intravenous contrast. RADIATION DOSE REDUCTION: This exam was performed according to the departmental dose-optimization program which includes automated exposure control, adjustment of the mA and/or kV according to patient size and/or use of iterative reconstruction technique. CONTRAST:  OMNIPAQUE IOHEXOL 300 MG/ML  SOLN COMPARISON:  Radiograph 07/06/2023, CT 11/25/2022 FINDINGS: Lower chest: Lung bases demonstrate moderate incompletely visualized pleural effusions. Extensive consolidation and ground-glass density in the right upper, right middle and bilateral lower lobes with additional ground-glass disease in the lingula. Cardiac size is normal. Coronary vascular calcification Hepatobiliary: Left hepatic lobe appears atrophic.  Small gallstones. No biliary dilatation Pancreas: Unremarkable. No pancreatic ductal dilatation or surrounding inflammatory changes. Spleen: Normal in size without focal abnormality. Adrenals/Urinary Tract: Adrenal glands are normal. Kidneys show no hydronephrosis. Cyst in the right kidney for which no imaging follow-up is recommended. The bladder is unremarkable Stomach/Bowel: Enteric tube tip in the distal stomach. There is no dilated small bowel. No acute bowel wall thickening. Vascular/Lymphatic: Moderate severe aortic atherosclerosis. No aneurysm. No suspicious lymph nodes. Reproductive: No adnexal mass.  Posterior uterine wall fibroid Other: Negative for free air.  No significant free fluid Musculoskeletal: No acute or suspicious osseous abnormality IMPRESSION: 1. No CT evidence for acute intra-abdominal or pelvic abnormality. No bowel obstruction or acute bowel wall thickening. 2. Enteric tube tip in the distal stomach. 3. Moderate incompletely visualized moderate bilateral pleural effusions with extensive consolidation and ground-glass density in the lung bases, suspect for multifocal pneumonia. There may be associated/underlying edema. 4. Gallstones. 5. Aortic atherosclerosis. Aortic Atherosclerosis (ICD10-I70.0). Electronically Signed   By: Jasmine Pang M.D.   On: 07/06/2023 23:07   DG Chest Port 1 View Result Date: 07/06/2023 CLINICAL DATA:  Hypoxia, intubated EXAM: PORTABLE CHEST 1 VIEW COMPARISON:  07/06/2023 at 0857 hours FINDINGS: Multifocal patchy right lung opacities, progressive. Aspiration/pneumonia is favored over moderate interstitial edema. Possible small right pleural effusion, equivocal. No pneumothorax. The heart is normal in size. Endotracheal tube terminates 2.8 cm above the carina. Enteric tube courses below stomach. IMPRESSION: Multifocal patchy right lung opacities, progressive. Aspiration/pneumonia is favored over moderate interstitial edema. Support apparatus as above.  Electronically Signed   By: Charline Bills M.D.   On: 07/06/2023 22:12   DG Abd 1 View Result Date: 07/06/2023 CLINICAL DATA:  Vomiting EXAM: ABDOMEN - 1 VIEW COMPARISON:  None Available. FINDINGS: Nonobstructive bowel gas pattern. Enteric tube terminates in the gastric antrum. Right groin catheter. Bilateral tubal ligation clips. IMPRESSION: Enteric tube terminates in the gastric antrum. Electronically Signed   By: Charline Bills M.D.   On: 07/06/2023 22:11   ECHOCARDIOGRAM COMPLETE Result Date: 07/06/2023    ECHOCARDIOGRAM REPORT   Patient Name:   SHINIKA ROSTON Date of Exam: 07/06/2023 Medical Rec #:  [id number]               Height:       61.0 in Accession #:    [id number]  Weight:       140.0 lb Date of Birth:  [birth year]                BSA:          1.623 m Patient Age:    67 years                BP:           86/60 mmHg Patient Gender: F                       HR:           91 bpm. Exam Location:  [facility] Procedure: 2D Echo, Cardiac Doppler and Color Doppler Indications:     Other abnormalities of the heart R00.8  History:         Patient has prior history of Echocardiogram examinations, most                  recent 11/26/2022. ESRD, Signs/Symptoms:Chest Pain; Risk                  Factors:Hypertension, Diabetes and Dyslipidemia.  Sonographer:     Lucendia Herrlich RCS Referring Phys:  [id number] Hubbard Hartshorn OUMA Diagnosing Phys: Yvonne Kendall MD IMPRESSIONS  1. Left ventricular ejection fraction, by estimation, is 50 to 55%. The left ventricle has low normal function. Left ventricular endocardial border not optimally defined to evaluate regional wall motion. There is mild left ventricular hypertrophy. Left ventricular diastolic parameters are consistent with Grade II diastolic dysfunction (pseudonormalization). Elevated left atrial pressure.  2. Right ventricular systolic function is normal. The right ventricular size is normal. There is normal pulmonary artery systolic pressure.   3. The mitral valve is degenerative. Mild to moderate mitral valve regurgitation. Moderate mitral annular calcification.  4. Tricuspid valve regurgitation is mild to moderate.  5. The aortic valve has an indeterminant number of cusps. There is severe calcifcation of the aortic valve. There is moderate thickening of the aortic valve. Aortic valve regurgitation is trivial. Severe aortic valve stenosis. Aortic valve area, by VTI measures 0.70 cm. Aortic valve mean gradient measures 47.0 mmHg.  6. The inferior vena cava is normal in size with <50% respiratory variability, suggesting right atrial pressure of 8 mmHg. FINDINGS  Left Ventricle: Left ventricular ejection fraction, by estimation, is 50 to 55%. The left ventricle has low normal function. Left ventricular endocardial border not optimally defined to evaluate regional wall motion. The left ventricular internal cavity  size was normal in size. There is mild left ventricular hypertrophy. Left ventricular diastolic parameters are consistent with Grade II diastolic dysfunction (pseudonormalization). Elevated left atrial pressure. Right Ventricle: The right ventricular size is normal. No increase in right ventricular wall thickness. Right ventricular systolic function is normal. There is normal pulmonary artery systolic pressure. The tricuspid regurgitant velocity is 2.55 m/s, and  with an assumed right atrial pressure of 8 mmHg, the estimated right ventricular systolic pressure is 34.0 mmHg. Left Atrium: Left atrial size was normal in size. Right Atrium: Right atrial size was normal in size. Pericardium: There is no evidence of pericardial effusion. Mitral Valve: The mitral valve is degenerative in appearance. There is mild thickening of the mitral valve leaflet(s). There is mild calcification of the mitral valve leaflet(s). Moderate mitral annular calcification. Mild to moderate mitral valve regurgitation. Tricuspid Valve: The tricuspid valve is not well  visualized. Tricuspid valve regurgitation is mild to moderate. Aortic Valve: The aortic valve  has an indeterminant number of cusps. There is severe calcifcation of the aortic valve. There is moderate thickening of the aortic valve. Aortic valve regurgitation is trivial. Severe aortic stenosis is present. Aortic valve mean gradient measures 47.0 mmHg. Aortic valve peak gradient measures 60.8 mmHg. Aortic valve area, by VTI measures 0.70 cm. Pulmonic Valve: The pulmonic valve was not well visualized. Pulmonic valve regurgitation is not visualized. No evidence of pulmonic stenosis. Aorta: The aortic root and ascending aorta are structurally normal, with no evidence of dilitation. Pulmonary Artery: The pulmonary artery is not well seen. Venous: The inferior vena cava is normal in size with less than 50% respiratory variability, suggesting right atrial pressure of 8 mmHg. IAS/Shunts: No atrial level shunt detected by color flow Doppler.  LEFT VENTRICLE PLAX 2D LVIDd:         4.40 cm   Diastology LVIDs:         3.20 cm   LV e' medial:    6.09 cm/s LV PW:         1.01 cm   LV E/e' medial:  25.0 LV IVS:        1.10 cm   LV e' lateral:   7.51 cm/s LVOT diam:     1.80 cm   LV E/e' lateral: 20.2 LV SV:         57 LV SV Index:   35 LVOT Area:     2.54 cm  RIGHT VENTRICLE            IVC RV S prime:     9.79 cm/s  IVC diam: 2.00 cm TAPSE (M-mode): 2.0 cm LEFT ATRIUM             Index        RIGHT ATRIUM           Index LA diam:        3.80 cm 2.34 cm/m   RA Area:     12.30 cm LA Vol (A2C):   50.8 ml 31.30 ml/m  RA Volume:   30.40 ml  18.73 ml/m LA Vol (A4C):   50.1 ml 30.87 ml/m LA Biplane Vol: 50.5 ml 31.11 ml/m  AORTIC VALVE AV Area (Vmax):    0.68 cm AV Area (Vmean):   0.62 cm AV Area (VTI):     0.70 cm AV Vmax:           389.75 cm/s AV Vmean:          293.000 cm/s AV VTI:            0.822 m AV Peak Grad:      60.8 mmHg AV Mean Grad:      47.0 mmHg LVOT Vmax:         104.00 cm/s LVOT Vmean:        71.500 cm/s LVOT  VTI:          0.225 m LVOT/AV VTI ratio: 0.27  AORTA Ao Root diam: 2.80 cm Ao Asc diam:  2.40 cm MITRAL VALVE                TRICUSPID VALVE MV Area (PHT): 5.62 cm     TR Peak grad:   26.0 mmHg MV Decel Time: 135 msec     TR Vmax:        255.00 cm/s MR Peak grad: 89.1 mmHg MR Vmax:      472.00 cm/s   SHUNTS MV E velocity: 152.00 cm/s  Systemic VTI:  0.22 m  MV A velocity: 119.00 cm/s  Systemic Diam: 1.80 cm MV E/A ratio:  1.28 Yvonne Kendall MD Electronically signed by Yvonne Kendall MD Signature Date/Time: 07/06/2023/5:52:13 PM    Final    DG Chest Port 1 View Result Date: 07/06/2023 CLINICAL DATA:  Intubation. EXAM: PORTABLE CHEST 1 VIEW COMPARISON:  Radiographs 07/05/2023 and 07/04/2023.  CT 11/25/2022. FINDINGS: 0857 hours. Tip of the endotracheal tube is unchanged, projecting over the mid trachea. Enteric tube projects below the diaphragm, tip not visualized. Left axillary and brachial vascular stents are noted. The heart size and mediastinal contours are stable. Further improved aeration of the right lung base with persistent basilar opacity and a probable right pleural effusion. No evidence of pneumothorax. Multiple telemetry leads overlie the chest. IMPRESSION: 1. Further improved aeration of the right lung base with persistent basilar opacity and probable right pleural effusion. 2. Stable support system. Electronically Signed   By: Carey Bullocks M.D.   On: 07/06/2023 12:53     Medications:    dexmedetomidine (PRECEDEX) IV infusion Stopped (07/07/23 1104)   heparin 900 Units/hr (07/08/23 0618)   piperacillin-tazobactam Stopped (07/08/23 0131)   promethazine (PHENERGAN) injection (IM or IVPB) Stopped (07/08/23 0339)    aspirin EC  81 mg Oral Daily   atorvastatin  80 mg Per Tube QHS   carvedilol  25 mg Per Tube BID WC   Chlorhexidine Gluconate Cloth  6 each Topical Daily   cloNIDine  0.2 mg Per Tube BID   ezetimibe  10 mg Per Tube Daily   feeding supplement (PROSource TF20)  60 mL Per  Tube Daily   insulin aspart  0-6 Units Subcutaneous Q4H   multivitamin  1 tablet Per Tube QHS   pantoprazole (PROTONIX) IV  40 mg Intravenous Daily   predniSONE  40 mg Per Tube Q breakfast   sevelamer carbonate  0.8 g Per Tube TID WC   hydrALAZINE, ipratropium-albuterol, labetalol, menthol-cetylpyridinium, ondansetron **OR** ondansetron (ZOFRAN) IV, mouth rinse, promethazine (PHENERGAN) injection (IM or IVPB)  Assessment/ Plan:  68 y.o. female with past medical history of diabetes mellitus type 2, peripheral neuropathy, GERD, hypertension, aortic stenosis, anemia of chronic kidney disease, peripheral arterial disease, ESRD on HD MWF who presented with shortness of breath and found to have RSV infection.  [facility]/MWF/[facility] [address]/left upper extremity AV graft  1.  ESRD on HD MWF.  We will plan for additional dialysis treatment today and see how she tolerates ultrafiltration.  2.  Acute respiratory failure secondary to RSV infection.  Extubated 07/07/2023.  Currently on BiPAP.  Critical care has requested additional ultrafiltration to assist with her pulmonary status.  3.  Anemia of chronic kidney disease.  Lab Results  Component Value Date   HGB 8.9 (L) 07/08/2023   Monitor CBC for now.  4.  Secondary hyperparathyroidism.  Maintain the patient on Renvela 0.8 g 3 times daily.   LOS: 4 Christy Curry 1/16/20257:55 AM

## 2023-07-08 NOTE — Plan of Care (Signed)
  Problem: Education: Goal: Ability to describe self-care measures that may prevent or decrease complications (Diabetes Survival Skills Education) will improve Outcome: Progressing   Problem: Health Behavior/Discharge Planning: Goal: Ability to identify and utilize available resources and services will improve Outcome: Progressing   Problem: Skin Integrity: Goal: Risk for impaired skin integrity will decrease Outcome: Progressing   Problem: Education: Goal: Knowledge of General Education information will improve Description: Including pain rating scale, medication(s)/side effects and non-pharmacologic comfort measures Outcome: Progressing   Problem: Health Behavior/Discharge Planning: Goal: Ability to manage health-related needs will improve Outcome: Progressing

## 2023-07-08 NOTE — Progress Notes (Signed)
ANTICOAGULATION CONSULT NOTE  Pharmacy Consult for heparin infusion Indication: VTE Tx  Allergies  Allergen Reactions   2,4-D Dimethylamine Swelling   Other Swelling   Tetanus Antitoxin Itching   Tetanus Toxoid    Tetanus Toxoids Itching    Patient Measurements: Height: 5\' 1"  (154.9 cm) Weight: 61.4 kg (135 lb 5.8 oz) IBW/kg (Calculated) : 47.8 Heparin Dosing Weight: 60.8 kg  Vital Signs: Temp: 99.1 F (37.3 C) (01/16 0400) Temp Source: Axillary (01/16 0400) BP: 156/111 (01/16 0530) Pulse Rate: 106 (01/16 0530)  Labs: Recent Labs    07/05/23 0754 07/05/23 1422 07/05/23 2339 07/06/23 0403 07/06/23 0846 07/06/23 1302 07/06/23 2026 07/07/23 0404 07/08/23 0502  HGB  --   --    < > 7.9*  --  7.8*  --  7.9* 8.9*  HCT  --   --    < > 24.5*  --  24.2*  --  24.7* 29.0*  PLT  --   --    < > 204  --  212  --  220 275  HEPARINUNFRC  --  0.29*   < >  --    < >  --  0.51 0.45 0.51  CREATININE  --   --   --  9.09*  --   --   --  5.86* 4.01*  TROPONINIHS 727* 553*  --   --   --   --   --   --   --    < > = values in this interval not displayed.    Estimated Creatinine Clearance: 11.3 mL/min (A) (by C-G formula based on SCr of 4.01 mg/dL (H)).   Medical History: Past Medical History:  Diagnosis Date   Diabetes mellitus without complication (HCC)    Hypertension    Renal disorder     Assessment: Pt is a 68 yo female presenting to ED c/o SOB, found with "acute symptomatic nonocclusive RUExt DVT."  Goal of Therapy:  Heparin level 0.3-0.7 units/ml Monitor platelets by anticoagulation protocol: Yes  Date/Time: HL: Comment/Rate: 1/14@0051  0.92 SUPRAtherapeutic@1100  units/hr 1/14@0846  0.76 SUPRAtherapeutic@1000  units/hr 1/14@2026  0.51 1/15@0404      0.45     Therapeutic X 2  1/16@0502      0.51     Therapeutic X 3    Plan:  1/16:  HL @ 0502 = 0.51, therapeutic X 3 - Will continue pt on current rate and recheck HL on 1/17 with AM labs.  Scherrie Gerlach,  PharmD Clinical Pharmacist 07/08/2023 5:41 AM

## 2023-07-08 NOTE — Consult Note (Signed)
Hospital Consult    Reason for Consult:  Dialysis [facility] Cath Placement  Requesting Physician:  Harlon Ditty NP MRN #:  [id number]  History of Present Illness: This is a 68 y.o. 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  She presented to [facility] emergency department on July 04, 2023 with acute hypoxic respiratory failure, RSV requiring mechanical ventilation with known multifocal pneumonia on x-ray.  Vascular surgery was consulted for permacatheter dialysis access and left upper arm fistula evaluation.  Dialysis attempted access but could not get it through the fistula and the ICU team has placed a temporary dialysis catheter in the patient's right groin.  On examination the patient is ventilated and sedated this morning.  Right groin access for dialysis is intact at this time.  She is noted to have coarse breath sounds bilaterally with a heart rate that was normal rhythm normal S1 and S2 with no murmurs appreciated.  Nursing had no complaints overnight.  Patient's vitals remained stable at this time.  Past Medical History:  Diagnosis Date   Diabetes mellitus without complication (HCC)    Hypertension    Renal disorder     Past Surgical History:  Procedure Laterality Date   av fistula Left     Allergies  Allergen Reactions   2,4-D Dimethylamine Swelling   Other Swelling   Tetanus Antitoxin Itching   Tetanus Toxoid    Tetanus Toxoids Itching    Prior to Admission medications   Medication Sig Start Date End Date Taking? Authorizing Provider  atorvastatin (LIPITOR) 20 MG tablet Take 1 tablet by mouth at bedtime. 01/05/23  Yes [provider]  b complex-vitamin c-folic acid (NEPHRO-VITE) 0.8 MG TABS tablet Take 1 tablet by mouth daily. 09/02/22  Yes [provider]  carvedilol (COREG) 25 MG tablet Take 25 mg by  mouth 2 (two) times daily with a meal. 03/29/19  Yes [provider]  cloNIDine (CATAPRES) 0.2 MG tablet Take 0.2 mg by mouth 2 (two) times daily. 03/03/23  Yes [provider]  ezetimibe (ZETIA) 10 MG tablet Take 1 tablet (10 mg total) by mouth daily. 03/22/23  Yes Antonieta Iba, MD  losartan (COZAAR) 100 MG tablet Take 100 mg by mouth daily.   Yes [provider]  ondansetron (ZOFRAN) 4 MG tablet Take 4 mg by mouth every 8 (eight) hours as needed. 05/14/23  Yes [provider]  pantoprazole (PROTONIX) 40 MG tablet Take 1 tablet (40 mg total) by mouth daily for 14 days. 05/26/23 07/04/23 Yes Concha Se, MD  sevelamer carbonate (RENVELA) 0.8 g PACK packet Take 0.8 g by mouth 3 (three) times daily with meals. 05/24/23  Yes [provider]  amLODipine (NORVASC) 10 MG tablet Take 1 tablet (10 mg total) by mouth daily. Patient not taking: Reported on 07/04/2023 03/22/23   Antonieta Iba, MD  cloNIDine (CATAPRES) 0.1 MG tablet Take 1 tablet by mouth 2 (two) times daily. Patient not taking: Reported on 07/04/2023 03/27/23   [provider]  glipiZIDE (GLUCOTROL XL) 2.5 MG 24 hr tablet Take 2.5 mg by mouth daily. Patient not taking: Reported on 05/27/2023 07/27/22   [provider]    Social History   Socioeconomic History   Marital status: Unknown    Spouse name: Not on file   Number of children: Not on file   Years of education: Not on  file   Highest education level: Not on file  Occupational History   Not on file  Tobacco Use   Smoking status: Never   Smokeless tobacco: Never  Vaping Use   Vaping status: Never Used  Substance and Sexual Activity   Alcohol use: Never   Drug use: Never   Sexual activity: Yes    Partners: Male  Other Topics Concern   Not on file  Social History Narrative   Not on file   Social Drivers of Health   Financial Resource Strain: Not on file  Food Insecurity: No Food Insecurity (07/05/2023)    Hunger Vital Sign    Worried About Running Out of Food in the Last Year: Never true    Ran Out of Food in the Last Year: Never true  Transportation Needs: No Transportation Needs (07/05/2023)   PRAPARE - Administrator, Civil Service (Medical): No    Lack of Transportation (Non-Medical): No  Physical Activity: Not on file  Stress: Not on file  Social Connections: Patient Unable To Answer (07/05/2023)   Social Connection and Isolation Panel [NHANES]    Frequency of Communication with Friends and Family: Patient unable to answer    Frequency of Social Gatherings with Friends and Family: Patient unable to answer    Attends Religious Services: Patient unable to answer    Active Member of Clubs or Organizations: Patient unable to answer    Attends Banker Meetings: Patient unable to answer    Marital Status: Patient unable to answer  Intimate Partner Violence: Patient Unable To Answer (07/05/2023)   Humiliation, Afraid, Rape, and Kick questionnaire    Fear of Current or Ex-Partner: Patient unable to answer    Emotionally Abused: Patient unable to answer    Physically Abused: Patient unable to answer    Sexually Abused: Patient unable to answer     Family History  Problem Relation Age of Onset   Kidney disease Mother    Diabetes Father    Peripheral Artery Disease Daughter 1       bilateral amputation    ROS: Otherwise negative unless mentioned in HPI  Physical Examination  Vitals:   07/08/23 0634 07/08/23 0700  BP:  (!) 149/73  Pulse: (!) 108 99  Resp: (!) 25 (!) 26  Temp:    SpO2: 95% 99%   Body mass index is 25.58 kg/m.  General:  WDWN in NAD Gait: Not observed HENT: WNL, normocephalic Pulmonary: normal non-labored breathing, without Rales, rhonchi,  wheezing Cardiac: regular, without  Murmurs, rubs or gallops; without carotid bruits Abdomen: Positive bowel sounds throughout, soft, NT/ND, no masses Skin: without rashes Vascular Exam/Pulses:  Palpable pulses throughout.  Left upper arm extremity with AV fistula.  Unable to palpate a thrill. Extremities: without ischemic changes, without Gangrene , without cellulitis; without open wounds;  Musculoskeletal: no muscle wasting or atrophy  Neurologic: A&O X 3;  No focal weakness or paresthesias are detected; speech is fluent/normal Psychiatric:  The pt has Normal affect. Lymph:  Unremarkable  CBC    Component Value Date/Time   WBC 12.3 (H) 07/08/2023 0502   RBC 2.92 (L) 07/08/2023 0502   HGB 8.9 (L) 07/08/2023 0502   HCT 29.0 (L) 07/08/2023 0502   PLT 275 07/08/2023 0502   MCV 99.3 07/08/2023 0502   MCH 30.5 07/08/2023 0502   MCHC 30.7 07/08/2023 0502   RDW 14.6 07/08/2023 0502   LYMPHSABS 0.7 07/07/2023 0404   MONOABS 0.8 07/07/2023 0404  EOSABS 0.0 07/07/2023 0404   BASOSABS 0.0 07/07/2023 0404    BMET    Component Value Date/Time   NA 134 (L) 07/08/2023 0502   K 4.3 07/08/2023 0502   CL 97 (L) 07/08/2023 0502   CO2 24 07/08/2023 0502   GLUCOSE 139 (H) 07/08/2023 0502   BUN 27 (H) 07/08/2023 0502   CREATININE 4.01 (H) 07/08/2023 0502   CALCIUM 8.8 (L) 07/08/2023 0502   GFRNONAA 12 (L) 07/08/2023 0502    COAGS: Lab Results  Component Value Date   INR 1.2 07/04/2023     Non-Invasive Vascular Imaging:   EXAM:07/06/23 PORTABLE CHEST 1 VIEW   COMPARISON:  07/06/2023 at 0857 hours   FINDINGS: Multifocal patchy right lung opacities, progressive. Aspiration/pneumonia is favored over moderate interstitial edema. Possible small right pleural effusion, equivocal. No pneumothorax.   The heart is normal in size.   Endotracheal tube terminates 2.8 cm above the carina.   Enteric tube courses below stomach.   IMPRESSION: Multifocal patchy right lung opacities, progressive. Aspiration/pneumonia is favored over moderate interstitial edema.  Statin:  Yes.   Beta Blocker:  Yes.   Aspirin:  No. ACEI:  No. ARB:  Yes.   CCB use:  Yes Other  antiplatelets/anticoagulants:  No.    ASSESSMENT/PLAN: This is a 68 y.o. female who presented to [facility] emergency department with hypoxic respiratory failure, RSV and required mechanical ventilation with noted multifocal pneumonia on x-ray.  Patient was transferred to ICU sedated intubated.  Vascular surgery was consulted for permacath dialysis catheter placement with left AV fistula nonfunctioning.  Once the patient has recovered from her extensive respiratory failure and pneumonia and is planning to be discharged we will at that time address her hemodialysis access.  At this time we will plan a left upper extremity AV fistulogram to see if we can resolve the issues and get her fistula functioning again.  In that date the patient would not need a permacatheter dialysis access catheter.  If we are unable to reestablish AV fistula access for dialysis for the patient on the outside at that time we will go ahead and place dialysis permacatheter for the patient.  We will then bring the patient back for further evaluation and possible reconstructive surgery of her AV fistula for better dialysis access.   -I discussed the plan in detail with Dr. Levora Dredge MD and he agrees to plan.   Marcie Bal [facility] 07/08/2023 7:18 AM

## 2023-07-08 NOTE — Consult Note (Signed)
Cardiology Consultation:   Patient ID: Christy Curry; [id number]; 20-Mar-1956   Admit date: 07/04/2023 Date of Consult: 07/08/2023  Primary Care Provider: Margorie John, MD Primary Cardiologist: Mariah Milling Primary Electrophysiologist:  None   Patient Profile:   Christy Curry is a 68 y.o. female with a hx of CAD, now severe aortic stenosis by echo this admission, aortic atherosclerosis with penetrating ulcer, RBBB, ESRD on HD currently undergoing transplant evaluation at [facility], DM2 with diabetic peripheral angiopathy and retinopathy, HTN, HLD, anemia of chronic disease, thyroid nodule, OSA, and chronic back pain who was admitted on 07/04/2023 with acute hypoxic respiratory failure requiring mechanical ventilation in the setting of RSV pneumonia with possible superimposed bacterial pneumonia and mucous plugging s/p extubation on 07/05/2022 with course complicated by aspiration pneumonia requiring reintubation on 07/05/2022, now extubated on 07/07/2023 and is being seen today for the evaluation of severe aortic stenosis and elevated troponin at the request of Harlon Ditty, NP.  History of Present Illness:   Christy Curry has been followed by cardiology for aortic stenosis as well as transplant service at [facility] for evaluation of renal transplant.  She has previously been noncommittal for cardiac catheterization.  Echo in 11/2022 showed an EF of 60 to 65%, no regional wall motion abnormalities, moderate LVH, grade 1 diastolic dysfunction, normal RV systolic function, ventricular cavity size, and RVSP, and moderate aortic stenosis with a mean gradient of 18.3 mmHg and a valve area 1.26 cm.  Echo in 02/2023 demonstrated an EF of 65 to 70%, mildly to moderately increased LV wall thickness, grade 2 diastolic dysfunction, normal RV systolic function and ventricular cavity size, and moderate aortic stenosis with a mean gradient of 29 mmHg and a valve area of 1.0 cm.  She most recently  underwent ischemic evaluation via Lexiscan MPI at [facility] on 05/14/2023 that demonstrated a moderate size, moderately severe, partially reversible perfusion defect involving the apical lateral, mid inferolateral, and mid anterolateral segments that was consistent with probable ischemia, unable to rule out scar.  EF greater than 60%.  When compared to study from 2023, the lateral defect was new.  She reports that she is scheduled to undergo cardiac cath through [facility] on 07/28/2023 for further evaluation of her abnormal stress test, aortic stenosis, and as part of her renal transplant evaluation.  She was admitted to [facility] on 07/04/2023 with a several day history of weakness, dyspnea, and cough.  Upon arrival she was hypoxic requiring BiPAP.  Chest x-ray notable for multifocal pneumonia.  She subsequently required intubation and mechanical ventilation on 07/04/2022 with bronchoscopy and airway clearance.  She was extubated on 07/05/2022, though had multiple vomiting events throughout the day and night with significant aspiration event requiring reintubation on 07/05/2022.  She was subsequently extubated on 07/06/2022 and has been weaned from BiPAP to high flow nasal cannula.  Upon admission she was noted to have a high-sensitivity troponin of 100 with a delta troponin of 169, trending to 795 on 1/13, subsequently downtrending.  Echo on 07/06/2023 showed an EF of 50 to 55%, mild LVH, grade 2 diastolic dysfunction, normal RV systolic function and ventricular cavity size, normal RVSP, mild to moderate mitral regurgitation, moderate mitral annular calcification, mild to moderate tricuspid regurgitation, severe aortic stenosis with a mean gradient of 47 millimeter mercury with a valve area of 0.7 cm.  Cardiology is consulted on 07/08/2023 for further evaluation of severe aortic stenosis.  At time of cardiology evaluation, the patient is without symptoms of angina or cardiac decompensation.  She remains on high flow nasal cannula and  reports feeling weak.    Past Medical History:  Diagnosis Date   Anemia of chronic disease    Aortic stenosis    Diabetes mellitus with complication (HCC)    Diabetic angiopathy (HCC)    Diabetic retinopathy (HCC)    ESRD (end stage renal disease) (HCC)    HLD (hyperlipidemia)    Hypertension    RBBB     Past Surgical History:  Procedure Laterality Date   av fistula Left      Home Meds: Prior to Admission medications   Medication Sig Start Date End Date Taking? Authorizing Provider  atorvastatin (LIPITOR) 20 MG tablet Take 1 tablet by mouth at bedtime. 01/05/23  Yes [provider]  b complex-vitamin c-folic acid (NEPHRO-VITE) 0.8 MG TABS tablet Take 1 tablet by mouth daily. 09/02/22  Yes [provider]  carvedilol (COREG) 25 MG tablet Take 25 mg by mouth 2 (two) times daily with a meal. 03/29/19  Yes [provider]  cloNIDine (CATAPRES) 0.2 MG tablet Take 0.2 mg by mouth 2 (two) times daily. 03/03/23  Yes [provider]  ezetimibe (ZETIA) 10 MG tablet Take 1 tablet (10 mg total) by mouth daily. 03/22/23  Yes Antonieta Iba, MD  losartan (COZAAR) 100 MG tablet Take 100 mg by mouth daily.   Yes [provider]  ondansetron (ZOFRAN) 4 MG tablet Take 4 mg by mouth every 8 (eight) hours as needed. 05/14/23  Yes [provider]  pantoprazole (PROTONIX) 40 MG tablet Take 1 tablet (40 mg total) by mouth daily for 14 days. 05/26/23 07/04/23 Yes Concha Se, MD  sevelamer carbonate (RENVELA) 0.8 g PACK packet Take 0.8 g by mouth 3 (three) times daily with meals. 05/24/23  Yes [provider]  amLODipine (NORVASC) 10 MG tablet Take 1 tablet (10 mg total) by mouth daily. Patient not taking: Reported on 07/04/2023 03/22/23   Antonieta Iba, MD  cloNIDine (CATAPRES) 0.1 MG tablet Take 1 tablet by mouth 2 (two) times daily. Patient not taking: Reported on 07/04/2023 03/27/23   [provider]  glipiZIDE (GLUCOTROL XL) 2.5  MG 24 hr tablet Take 2.5 mg by mouth daily. Patient not taking: Reported on 05/27/2023 07/27/22   [provider]    Inpatient Medications: Scheduled Meds:  aspirin EC  81 mg Oral Daily   atorvastatin  80 mg Per Tube QHS   carvedilol  25 mg Per Tube BID WC   Chlorhexidine Gluconate Cloth  6 each Topical Daily   cloNIDine  0.2 mg Per Tube BID   ezetimibe  10 mg Per Tube Daily   feeding supplement (PROSource TF20)  60 mL Per Tube Daily   insulin aspart  0-6 Units Subcutaneous Q4H   ipratropium-albuterol  3 mL Nebulization Q6H   multivitamin  1 tablet Per Tube QHS   pantoprazole (PROTONIX) IV  40 mg Intravenous Daily   predniSONE  40 mg Per Tube Q breakfast   sevelamer carbonate  0.8 g Per Tube TID WC   Continuous Infusions:  dexmedetomidine (PRECEDEX) IV infusion Stopped (07/07/23 1104)   heparin 900 Units/hr (07/08/23 0618)   piperacillin-tazobactam Stopped (07/08/23 0131)   promethazine (PHENERGAN) injection (IM or IVPB) Stopped (07/08/23 0339)   PRN Meds: hydrALAZINE, ipratropium-albuterol, labetalol, menthol-cetylpyridinium, ondansetron **OR** ondansetron (ZOFRAN) IV, mouth rinse, promethazine (PHENERGAN) injection (IM or IVPB)  Allergies:   Allergies  Allergen Reactions   2,4-D Dimethylamine Swelling   Other Swelling  Tetanus Antitoxin Itching   Tetanus Toxoid    Tetanus Toxoids Itching    Social History:   Social History   Socioeconomic History   Marital status: Unknown    Spouse name: Not on file   Number of children: Not on file   Years of education: Not on file   Highest education level: Not on file  Occupational History   Not on file  Tobacco Use   Smoking status: Never   Smokeless tobacco: Never  Vaping Use   Vaping status: Never Used  Substance and Sexual Activity   Alcohol use: Never   Drug use: Never   Sexual activity: Yes    Partners: Male  Other Topics Concern   Not on file  Social History Narrative   Not on file   Social Drivers  of Health   Financial Resource Strain: Not on file  Food Insecurity: No Food Insecurity (07/05/2023)   Hunger Vital Sign    Worried About Running Out of Food in the Last Year: Never true    Ran Out of Food in the Last Year: Never true  Transportation Needs: No Transportation Needs (07/05/2023)   PRAPARE - Administrator, Civil Service (Medical): No    Lack of Transportation (Non-Medical): No  Physical Activity: Not on file  Stress: Not on file  Social Connections: Patient Unable To Answer (07/05/2023)   Social Connection and Isolation Panel [NHANES]    Frequency of Communication with Friends and Family: Patient unable to answer    Frequency of Social Gatherings with Friends and Family: Patient unable to answer    Attends Religious Services: Patient unable to answer    Active Member of Clubs or Organizations: Patient unable to answer    Attends Banker Meetings: Patient unable to answer    Marital Status: Patient unable to answer  Intimate Partner Violence: Patient Unable To Answer (07/05/2023)   Humiliation, Afraid, Rape, and Kick questionnaire    Fear of Current or Ex-Partner: Patient unable to answer    Emotionally Abused: Patient unable to answer    Physically Abused: Patient unable to answer    Sexually Abused: Patient unable to answer     Family History:   Family History  Problem Relation Age of Onset   Kidney disease Mother    Diabetes Father    Peripheral Artery Disease Daughter 48       bilateral amputation    ROS:  Review of Systems  Constitutional:  Positive for malaise/fatigue. Negative for chills, diaphoresis, fever and weight loss.  HENT:  Negative for congestion.   Eyes:  Negative for discharge and redness.  Respiratory:  Positive for cough, sputum production and shortness of breath. Negative for wheezing.   Cardiovascular:  Negative for chest pain, palpitations, orthopnea, claudication, leg swelling and PND.  Gastrointestinal:  Positive  for nausea. Negative for abdominal pain, heartburn and vomiting.  Musculoskeletal:  Negative for falls and myalgias.  Skin:  Negative for rash.  Neurological:  Positive for weakness. Negative for dizziness, tingling, tremors, sensory change, speech change, focal weakness and loss of consciousness.  Endo/Heme/Allergies:  Does not bruise/bleed easily.  Psychiatric/Behavioral:  Negative for substance abuse. The patient is not nervous/anxious.   All other systems reviewed and are negative.     Physical Exam/Data:   Vitals:   07/08/23 1000 07/08/23 1030 07/08/23 1100 07/08/23 1111  BP: (!) 156/103 (!) 174/89  (!) 141/63  Pulse: (!) 109 (!) 109 (!) 101 (!) 102  Resp: (!) 22 (!) 23 (!) 26 (!) 21  Temp:      TempSrc:      SpO2: 99% 95% 97% 97%  Weight:      Height:        Intake/Output Summary (Last 24 hours) at 07/08/2023 1116 Last data filed at 07/08/2023 0800 Gross per 24 hour  Intake 629.55 ml  Output 625 ml  Net 4.55 ml   Filed Weights   07/06/23 1221 07/07/23 0330 07/08/23 0345  Weight: 63.5 kg 60.9 kg 61.4 kg   Body mass index is 25.58 kg/m.   Physical Exam: General: Ill and frail appearing, in no acute distress. Head: Normocephalic, atraumatic, sclera non-icteric, no xanthomas, nares without discharge.  Neck: Negative for carotid bruits. JVD not elevated. Lungs: Diminished with diffuse rhonchi bilaterally particularly along the bases. Breathing is unlabored on humidified supplemental HF oxygen. Heart: RRR with S1 S2. III/VI systolic murmur RUSB, no rubs, or gallops appreciated. Abdomen: Soft, non-tender, non-distended with normoactive bowel sounds. No hepatomegaly. No rebound/guarding. No obvious abdominal masses. Msk:  Strength and tone appear normal for age. Extremities: No clubbing or cyanosis. No edema. Distal pedal pulses are 2+ and equal bilaterally. Neuro: Alert and oriented X 3. No facial asymmetry. No focal deficit. Moves all extremities spontaneously. Psych:   Responds to questions appropriately with a normal affect.   EKG:  The EKG was personally reviewed and demonstrates: 07/04/2023 - sinus tachycardia, 110 bpm, RBBB, baseline wandering an artifact. 07/05/2023 - NSR, 90 bpm, RBBB, left posterior fascicular block, nonspecific inferolateral st/t changes. 07/06/2023 - NSR, 99 bpm, RBBB, nonspecific inferolateral st/t changes. Telemetry:  Telemetry was personally reviewed and demonstrates: sinus rhythm with sinus tachycardia, 90s to low 100s bpm  Weights: Filed Weights   07/06/23 1221 07/07/23 0330 07/08/23 0345  Weight: 63.5 kg 60.9 kg 61.4 kg    Relevant CV Studies:  2D echo 07/06/2023: 1. Left ventricular ejection fraction, by estimation, is 50 to 55%. The  left ventricle has low normal function. Left ventricular endocardial  border not optimally defined to evaluate regional wall motion. There is  mild left ventricular hypertrophy. Left  ventricular diastolic parameters are consistent with Grade II diastolic  dysfunction (pseudonormalization). Elevated left atrial pressure.   2. Right ventricular systolic function is normal. The right ventricular  size is normal. There is normal pulmonary artery systolic pressure.   3. The mitral valve is degenerative. Mild to moderate mitral valve  regurgitation. Moderate mitral annular calcification.   4. Tricuspid valve regurgitation is mild to moderate.   5. The aortic valve has an indeterminant number of cusps. There is severe  calcifcation of the aortic valve. There is moderate thickening of the  aortic valve. Aortic valve regurgitation is trivial. Severe aortic valve  stenosis. Aortic valve area, by VTI  measures 0.70 cm. Aortic valve mean gradient measures 47.0 mmHg.   6. The inferior vena cava is normal in size with <50% respiratory  variability, suggesting right atrial pressure of 8 mmHg. __________  Eugenie Birks MPI 05/14/2023 [facility]): - Abnormal myocardial perfusion study  - There is a moderate  sized, moderately severe, partially reversible  perfusion defect involving the apical lateral, mid inferolateral and mid  anterolateral segments. This is consistent with probable ischemia and  cannot rule out scar.  - Left ventricular systolic function is normal. Post stress the ejection  fraction is > 60%.  - Coronary calcifications are noted  - Mitral annular calcifications are noted  - Aortic valve calcifications are noted  -  Compared to the prior study 2023, the lateral defect is new  __________  2D echo 03/11/2023 [facility]):  1. The left ventricle is normal in size with mildly to moderately increased  wall thickness.    2. The left ventricular systolic function is normal, LVEF is visually  estimated at 65-70%.    3. There is grade II diastolic dysfunction (elevated filling pressure).    4. There is moderate aortic valve stenosis with a peak velocity of 3.5 m/s,  mean gradient of 29 mmHg, and aortic valve area of 1.0 cm2.    5. The right ventricle is normal in size, with normal systolic function.  __________  2D echo 11/26/2022: 1. Left ventricular ejection fraction, by estimation, is 60 to 65%. The  left ventricle has normal function. The left ventricle has no regional  wall motion abnormalities. There is moderate left ventricular hypertrophy.  Left ventricular diastolic  parameters are consistent with Grade I diastolic dysfunction (impaired  relaxation).   2. Right ventricular systolic function is normal. The right ventricular  size is normal. There is normal pulmonary artery systolic pressure. The  estimated right ventricular systolic pressure is 24.5 mmHg.   3. The mitral valve is normal in structure. No evidence of mitral valve  regurgitation. No evidence of mitral stenosis.   4. The aortic valve is normal in structure. There is severe calcifcation  of the aortic valve. Aortic valve regurgitation is not visualized.  Moderate aortic valve stenosis. Aortic valve area, by VTI  measures 1.26  cm. Aortic valve mean gradient measures  18.3 mmHg. Aortic valve Vmax measures 2.86 m/s.   5. The inferior vena cava is normal in size with greater than 50%  respiratory variability, suggesting right atrial pressure of 3 mmHg.   __________  Eugenie Birks MPI 08/07/2021 [facility]): - Normal myocardial perfusion study, similar to prior of 07/20/2019  - No evidence of any significant ischemia or scar  - Left ventricular systolic function is normal. Post stress the ejection fraction is > 60%.  - Aortic calcifications are noted. Right thyroid nodule described on prior ultrasound is seen.  __________  2D echo 08/07/2021 [facility]): 1. The left ventricle is normal in size with mildly increased wall  thickness.   2. The left ventricular systolic function is normal, LVEF is visually  estimated at > 55%.    3. There is grade I diastolic dysfunction (impaired relaxation).    4. The aortic valve is trileaflet with mildly thickened leaflets with  probably normal excursion.    5. The right ventricle is normal in size, with normal systolic function.  __________  Eugenie Birks MPI 07/20/2019 [facility]): Impressions:  - Low risk probably normal myocardial perfusion study  - There is a small in size, subtle in severity, minimally reversible  defect involving the mid inferolateral segment. This is consistent with  probable artifact (misregistration artifact and motion artifact) but  cannot rule out subtle ischemia.  - Post stress: Global systolic function is normal. The ejection fraction  calculated at 58%.  - Minimal coronary and aortic calcifications are noted  - Incidentally noted on the attenuation CT scan is a possible gallstone,  and an incompletely visualize right thyroid lobe nodule. Clinical and / or  additional imaging recommended  __________  2D echo 06/29/2019 [facility]): 1. The left ventricle is normal in size with normal wall thickness.    2. The left ventricular systolic function is normal, LVEF is  visually  estimated at > 55%.    3. The aortic  valve is trileaflet with moderately thickened leaflets with  normal excursion.    4. The right ventricle is normal in size, with normal systolic function. __________  Limited echo 01/20/2019 ([facility]): INTERPRETATION  LIMITED STUDY  NORMAL LEFT VENTRICULAR SYSTOLIC FUNCTION   WITH MILD LVH  NORMAL RIGHT VENTRICULAR SYSTOLIC FUNCTION  SMALL PERICARDIAL EFFUSION  COMPARED TO PRIOR 01/02/2019:  NO SIGNIFICANT CHANGE  __________  2D echo 01/02/2019 ([facility]): INTERPRETATION  NORMAL LEFT VENTRICULAR SYSTOLIC FUNCTION   WITH MILD LVH  NORMAL RIGHT VENTRICULAR SYSTOLIC FUNCTION  NO VALVULAR STENOSIS  MILD MITRAL REGURGITATION  SMALL PERICARDIAL EFFUSION  NO PRIOR STUDY FOR COMPARISON    Laboratory Data:  Chemistry Recent Labs  Lab 07/06/23 0403 07/07/23 0404 07/08/23 0502  NA 141 136 134*  K 4.1 4.1 4.3  CL 104 99 97*  CO2 23 26 24   GLUCOSE 150* 134* 139*  BUN 47* 36* 27*  CREATININE 9.09* 5.86* 4.01*  CALCIUM 8.0* 8.0* 8.8*  GFRNONAA 4* 7* 12*  ANIONGAP 14 11 13     Recent Labs  Lab 07/04/23 1348 07/06/23 0403 07/07/23 0404 07/08/23 0502  PROT 7.2  --  5.5*  --   ALBUMIN 3.9 2.7* 2.6*  2.6* 3.0*  AST 23  --  15  --   ALT 13  --  11  --   ALKPHOS 81  --  53  --   BILITOT 0.7  --  0.6  --    Hematology Recent Labs  Lab 07/06/23 1302 07/07/23 0404 07/08/23 0502  WBC 12.3* 11.9* 12.3*  RBC 2.50* 2.55* 2.92*  HGB 7.8* 7.9* 8.9*  HCT 24.2* 24.7* 29.0*  MCV 96.8 96.9 99.3  MCH 31.2 31.0 30.5  MCHC 32.2 32.0 30.7  RDW 15.3 14.9 14.6  PLT 212 220 275   Cardiac EnzymesNo results for input(s): "TROPONINI" in the last 168 hours. No results for input(s): "TROPIPOC" in the last 168 hours.  BNPNo results for input(s): "BNP", "PROBNP" in the last 168 hours.  DDimer No results for input(s): "DDIMER" in the last 168 hours.  Radiology/Studies:  [facility] Chest Port 1 View Result Date: 07/08/2023 IMPRESSION: 1. Support apparatus,  as above. 2. Severe but improving multilobar bilateral pneumonia, as above. 3. Small bilateral pleural effusions. 4. Aortic atherosclerosis. Electronically Signed   By: Trudie Reed M.D.   On: 07/08/2023 05:20   CT ABDOMEN PELVIS W CONTRAST Result Date: 07/06/2023 IMPRESSION: 1. No CT evidence for acute intra-abdominal or pelvic abnormality. No bowel obstruction or acute bowel wall thickening. 2. Enteric tube tip in the distal stomach. 3. Moderate incompletely visualized moderate bilateral pleural effusions with extensive consolidation and ground-glass density in the lung bases, suspect for multifocal pneumonia. There may be associated/underlying edema. 4. Gallstones. 5. Aortic atherosclerosis. Aortic Atherosclerosis (ICD10-I70.0). Electronically Signed   By: Jasmine Pang M.D.   On: 07/06/2023 23:07   DG Chest Port 1 View Result Date: 07/06/2023 IMPRESSION: Multifocal patchy right lung opacities, progressive. Aspiration/pneumonia is favored over moderate interstitial edema. Support apparatus as above. Electronically Signed   By: Charline Bills M.D.   On: 07/06/2023 22:12   DG Abd 1 View Result Date: 07/06/2023 IMPRESSION: Enteric tube terminates in the gastric antrum. Electronically Signed   By: Charline Bills M.D.   On: 07/06/2023 22:11   DG Chest Port 1 View Result Date: 07/06/2023 IMPRESSION: 1. Further improved aeration of the right lung base with persistent basilar opacity and probable right pleural effusion. 2. Stable support system. Electronically Signed   By:  Carey Bullocks M.D.   On: 07/06/2023 12:53   DG Chest 1 View Result Date: 07/05/2023 IMPRESSION: 1. Enteric tube with tip in the region of the gastric fundus. 2. Bilateral lung infiltrates. Electronically Signed   By: Elgie Collard M.D.   On: 07/05/2023 12:21   DG Abd 1 View Result Date: 07/05/2023 IMPRESSION: 1. Enteric tube with tip in the region of the gastric fundus. 2. Bilateral lung infiltrates. Electronically  Signed   By: Elgie Collard M.D.   On: 07/05/2023 12:21   DG Abd 1 View Result Date: 07/05/2023 IMPRESSION: Enteric tube terminates in the gastric cardia. Electronically Signed   By: Charline Bills M.D.   On: 07/05/2023 00:04   DG Chest Port 1 View Result Date: 07/05/2023 IMPRESSION: Endotracheal tube terminates 2.9 cm above the carina. Progressive right lung pneumonia.  Small right pleural effusion. Underlying multifocal pneumonia, less likely interstitial edema. Electronically Signed   By: Charline Bills M.D.   On: 07/05/2023 00:04   US Venous Img Upper Uni Right(DVT) Result Date: 07/04/2023 IMPRESSION: 1. Nonocclusive superficial venous thrombosis in the right basilic vein. Otherwise unremarkable. Electronically Signed   By: Gaylyn Rong M.D.   On: 07/04/2023 20:00   DG Chest Portable 1 View Result Date: 07/04/2023 IMPRESSION: 1. Asymmetric interstitial thickening in the right lung with hazy airspace disease in the right mid lung and left lower lung, concerning for moderate pulmonary edema versus multifocal pneumonia. 2. Suspected small bilateral pleural effusions. Electronically Signed   By: Obie Dredge M.D.   On: 07/04/2023 14:09    Assessment and Plan:   1. Acute hypoxic respiratory failure: -She was admitted on 07/04/2023 with acute hypoxic respiratory failure requiring mechanical ventilation in the setting of RSV pneumonia with possible superimposed bacterial pneumonia and mucous plugging s/p extubation on 07/05/2022 with course complicated by aspiration pneumonia requiring reintubation on 07/05/2022, now extubated on 07/07/2023 -Management per CCM  2. Severe aortic stenosis: -Echo this admission with severe aortic stenosis with a mean gradient of 47 mmHg and a valve area 0.71 cm.  This is progression from echo obtained at [facility] in 02/2023 at which time she had a mean gradient of 29 mmHg and a valve area of 1.0 cm -Do not suspect that this is the primary driving force of  her respiratory failure, though it is certainly contributory -She will require further evaluation with R/LHC as outlined below once she is improved from her acute pulmonary illness  3.  CAD with elevated high sensitivity troponin: -No chest pain -Mildly elevated and likely reflective of supply/demand ischemia secondary to severe acute pulmonary illness, severe aortic stenosis, anemia of chronic disease, and ESRD -Echo with low normal LV systolic function -She will require ischemic evaluation with [facility] for further evaluation of her type II MI and severe aortic stenosis -She reports that she is scheduled to undergo cardiac cath at [facility] on 07/28/2023 -No current indication for emergent LHC at this time -She has completed greater than 48 hours of IV heparin, okay to discontinue from a cardiac perspective, will defer to critical care medicine      For questions or updates, please contact [facility] Please consult [email or web address] for contact info under Cardiology/STEMI.   Signed, Eula Listen, PA-C [facility] Pager: [phone] 07/08/2023, 11:16 AM

## 2023-07-09 DIAGNOSIS — R918 Other nonspecific abnormal finding of lung field: Secondary | ICD-10-CM | POA: Diagnosis not present

## 2023-07-09 DIAGNOSIS — I214 Non-ST elevation (NSTEMI) myocardial infarction: Secondary | ICD-10-CM | POA: Diagnosis not present

## 2023-07-09 DIAGNOSIS — I350 Nonrheumatic aortic (valve) stenosis: Secondary | ICD-10-CM | POA: Diagnosis not present

## 2023-07-09 LAB — RENAL FUNCTION PANEL
Albumin: 2.8 g/dL — ABNORMAL LOW (ref 3.5–5.0)
Anion gap: 18 — ABNORMAL HIGH (ref 5–15)
BUN: 24 mg/dL — ABNORMAL HIGH (ref 8–23)
CO2: 21 mmol/L — ABNORMAL LOW (ref 22–32)
Calcium: 8.6 mg/dL — ABNORMAL LOW (ref 8.9–10.3)
Chloride: 95 mmol/L — ABNORMAL LOW (ref 98–111)
Creatinine, Ser: 3.79 mg/dL — ABNORMAL HIGH (ref 0.44–1.00)
GFR, Estimated: 12 mL/min — ABNORMAL LOW (ref >60)
Glucose, Bld: 143 mg/dL — ABNORMAL HIGH (ref 70–99)
Phosphorus: 5.1 mg/dL — ABNORMAL HIGH (ref 2.5–4.6)
Potassium: 4.2 mmol/L (ref 3.5–5.1)
Sodium: 134 mmol/L — ABNORMAL LOW (ref 135–145)

## 2023-07-09 LAB — CULTURE, BLOOD (ROUTINE X 2)
Culture: NO GROWTH
Culture: NO GROWTH
Special Requests: ADEQUATE
Special Requests: ADEQUATE

## 2023-07-09 LAB — BLOOD GAS, ARTERIAL
Acid-base deficit: 2.6 mmol/L — ABNORMAL HIGH (ref 0.0–2.0)
Bicarbonate: 21.8 mmol/L (ref 20.0–28.0)
Delivery systems: POSITIVE
Expiratory PAP: 5 cm[H2O]
FIO2: 60 %
Inspiratory PAP: 15 cm[H2O]
O2 Saturation: 98.5 %
Patient temperature: 37
pCO2 arterial: 36 mm[Hg] (ref 32–48)
pH, Arterial: 7.39 (ref 7.35–7.45)
pO2, Arterial: 232 mm[Hg] — ABNORMAL HIGH (ref 83–108)

## 2023-07-09 LAB — CBC
HCT: 24.6 % — ABNORMAL LOW (ref 36.0–46.0)
Hemoglobin: 7.8 g/dL — ABNORMAL LOW (ref 12.0–15.0)
MCH: 30.4 pg (ref 26.0–34.0)
MCHC: 31.7 g/dL (ref 30.0–36.0)
MCV: 95.7 fL (ref 80.0–100.0)
Platelets: 202 10*3/uL (ref 150–400)
RBC: 2.57 MIL/uL — ABNORMAL LOW (ref 3.87–5.11)
RDW: 14.4 % (ref 11.5–15.5)
WBC: 6.3 10*3/uL (ref 4.0–10.5)
nRBC: 0 % (ref 0.0–0.2)

## 2023-07-09 LAB — GLUCOSE, CAPILLARY
Glucose-Capillary: 133 mg/dL — ABNORMAL HIGH (ref 70–99)
Glucose-Capillary: 150 mg/dL — ABNORMAL HIGH (ref 70–99)
Glucose-Capillary: 132 mg/dL — ABNORMAL HIGH (ref 70–99)
Glucose-Capillary: 130 mg/dL — ABNORMAL HIGH (ref 70–99)
Glucose-Capillary: 151 mg/dL — ABNORMAL HIGH (ref 70–99)
Glucose-Capillary: 146 mg/dL — ABNORMAL HIGH (ref 70–99)

## 2023-07-09 LAB — HEPARIN LEVEL (UNFRACTIONATED): Heparin Unfractionated: 0.46 [IU]/mL (ref 0.30–0.70)

## 2023-07-09 LAB — MAGNESIUM: Magnesium: 2.2 mg/dL (ref 1.7–2.4)

## 2023-07-09 MED ORDER — EZETIMIBE 10 MG PO TABS
10.0000 mg | ORAL_TABLET | Freq: Every day | ORAL | Status: DC
  Administered 2023-07-10 – 2023-07-12 (×3): 10 mg via ORAL
  Filled 2023-07-09 (×3): qty 1

## 2023-07-09 MED ORDER — ATORVASTATIN CALCIUM 80 MG PO TABS
80.0000 mg | ORAL_TABLET | Freq: Every day | ORAL | Status: DC
  Administered 2023-07-09 – 2023-07-12 (×4): 80 mg via ORAL
  Filled 2023-07-09 (×4): qty 1

## 2023-07-09 MED ORDER — PREDNISONE 20 MG PO TABS: 20.0000 mg | ORAL_TABLET | Freq: Every day | ORAL | Status: DC

## 2023-07-09 MED ORDER — CARVEDILOL 25 MG PO TABS
25.0000 mg | ORAL_TABLET | Freq: Two times a day (BID) | ORAL | Status: DC
  Administered 2023-07-10 – 2023-07-11 (×4): 25 mg via ORAL
  Filled 2023-07-09 (×4): qty 1

## 2023-07-09 MED ORDER — METOPROLOL TARTRATE 5 MG/5ML IV SOLN
5.0000 mg | Freq: Four times a day (QID) | INTRAVENOUS | Status: DC
  Administered 2023-07-09: 5 mg via INTRAVENOUS
  Filled 2023-07-09: qty 5

## 2023-07-09 MED ORDER — IPRATROPIUM-ALBUTEROL 0.5-2.5 (3) MG/3ML IN SOLN
3.0000 mL | Freq: Four times a day (QID) | RESPIRATORY_TRACT | Status: DC
  Administered 2023-07-10 – 2023-07-12 (×7): 3 mL via RESPIRATORY_TRACT
  Filled 2023-07-09 (×8): qty 3

## 2023-07-09 MED ORDER — PREDNISONE 20 MG PO TABS
30.0000 mg | ORAL_TABLET | Freq: Every day | ORAL | Status: AC
  Administered 2023-07-10 – 2023-07-12 (×3): 30 mg via ORAL
  Filled 2023-07-09 (×3): qty 1

## 2023-07-09 MED ORDER — SEVELAMER CARBONATE 0.8 G PO PACK
0.8000 g | PACK | Freq: Three times a day (TID) | ORAL | Status: DC
  Administered 2023-07-10 – 2023-07-12 (×7): 0.8 g via ORAL
  Filled 2023-07-09 (×10): qty 1

## 2023-07-09 MED ORDER — PREDNISONE 10 MG PO TABS: 10.0000 mg | ORAL_TABLET | Freq: Every day | ORAL | Status: DC

## 2023-07-09 MED ORDER — STERILE WATER FOR INJECTION IJ SOLN
INTRAMUSCULAR | Status: AC
  Administered 2023-07-09: 10 mL
  Filled 2023-07-09: qty 10

## 2023-07-09 MED ORDER — RENA-VITE PO TABS
1.0000 | ORAL_TABLET | Freq: Every day | ORAL | Status: DC
  Administered 2023-07-09 – 2023-07-12 (×4): 1 via ORAL
  Filled 2023-07-09 (×4): qty 1

## 2023-07-09 MED ORDER — HEPARIN SODIUM (PORCINE) 1000 UNIT/ML IJ SOLN
INTRAMUSCULAR | Status: AC
  Filled 2023-07-09: qty 10

## 2023-07-09 MED ORDER — IPRATROPIUM-ALBUTEROL 0.5-2.5 (3) MG/3ML IN SOLN
3.0000 mL | RESPIRATORY_TRACT | Status: DC
  Administered 2023-07-09 (×3): 3 mL via RESPIRATORY_TRACT
  Filled 2023-07-09 (×3): qty 3

## 2023-07-09 MED ORDER — METOPROLOL TARTRATE 5 MG/5ML IV SOLN
2.5000 mg | INTRAVENOUS | Status: AC
  Administered 2023-07-09: 2.5 mg via INTRAVENOUS
  Filled 2023-07-09: qty 5

## 2023-07-09 MED ORDER — BISACODYL 10 MG RE SUPP
10.0000 mg | Freq: Once | RECTAL | Status: AC
  Administered 2023-07-09: 10 mg via RECTAL
  Filled 2023-07-09: qty 1

## 2023-07-09 MED ORDER — POLYETHYLENE GLYCOL 3350 17 G PO PACK
17.0000 g | PACK | Freq: Every day | ORAL | Status: DC | PRN
  Administered 2023-07-10: 17 g via ORAL
  Filled 2023-07-09: qty 1

## 2023-07-09 MED ORDER — METHYLPREDNISOLONE SODIUM SUCC 40 MG IJ SOLR
40.0000 mg | Freq: Once | INTRAMUSCULAR | Status: AC
  Administered 2023-07-09: 40 mg via INTRAVENOUS
  Filled 2023-07-09: qty 1

## 2023-07-09 MED ORDER — DOCUSATE SODIUM 100 MG PO CAPS
100.0000 mg | ORAL_CAPSULE | Freq: Two times a day (BID) | ORAL | Status: DC | PRN
  Administered 2023-07-10 (×2): 100 mg via ORAL
  Filled 2023-07-09 (×3): qty 1

## 2023-07-09 MED ORDER — CLONIDINE HCL 0.1 MG/24HR TD PTWK
0.1000 mg | MEDICATED_PATCH | TRANSDERMAL | Status: DC
  Administered 2023-07-09: 0.1 mg via TRANSDERMAL
  Filled 2023-07-09: qty 1

## 2023-07-09 NOTE — Progress Notes (Signed)
Suspected SCAPE Severe Aortic Stenosis Hypertension Patient experiencing cyclic episodes of dyspnea, increased WOB, HTN and generalized discomfort. ABG stable. Case discussed with Intensivist on call. Troponin peaked at 5,054 now trending down. - BIPAP overnight - solu medrol IV and duo nebs administered - nitroglycerin drip to maintain SBP < 150 - low dose precedex drip started for agitation - lopressor Q 6 h - clonidine PO changed to low dose patch  - f/u ABG in AM - comfort measures    Cheryll Cockayne Rust-Chester, AGACNP-BC Acute Care Nurse Practitioner [facility]   [phone] / [phone] Please see Amion for details.

## 2023-07-09 NOTE — Progress Notes (Signed)
ANTICOAGULATION CONSULT NOTE  Pharmacy Consult for heparin infusion Indication: VTE Tx  Allergies  Allergen Reactions   2,4-D Dimethylamine Swelling   Other Swelling   Tetanus Antitoxin Itching   Tetanus Toxoid    Tetanus Toxoids Itching    Patient Measurements: Height: 5\' 1"  (154.9 cm) Weight: 61.4 kg (135 lb 5.8 oz) IBW/kg (Calculated) : 47.8 Heparin Dosing Weight: 60.8 kg  Vital Signs: Temp: 99.8 F (37.7 C) (01/17 0500) Temp Source: Axillary (01/17 0500) BP: 148/70 (01/17 0600) Pulse Rate: 94 (01/17 0600)  Labs: Recent Labs    07/07/23 0404 07/08/23 0502 07/08/23 1112 07/08/23 1337 07/08/23 1752 07/08/23 1947 07/09/23 0524  HGB 7.9* 8.9*  --   --   --   --  7.8*  HCT 24.7* 29.0*  --   --   --   --  24.6*  PLT 220 275  --   --   --   --  202  HEPARINUNFRC 0.45 0.51  --   --   --   --  0.46  CREATININE 5.86* 4.01*  --   --   --   --  3.79*  TROPONINIHS  --   --    < > 2,798* 5,054* 3,079*  --    < > = values in this interval not displayed.    Estimated Creatinine Clearance: 11.9 mL/min (A) (by C-G formula based on SCr of 3.79 mg/dL (H)).   Medical History: Past Medical History:  Diagnosis Date   Anemia of chronic disease    Aortic stenosis    Diabetes mellitus with complication (HCC)    Diabetic angiopathy (HCC)    Diabetic retinopathy (HCC)    ESRD (end stage renal disease) (HCC)    HLD (hyperlipidemia)    Hypertension    RBBB     Assessment: Pt is a 68 yo female presenting to ED c/o SOB, found with "acute symptomatic nonocclusive RUExt DVT."  Goal of Therapy:  Heparin level 0.3-0.7 units/ml Monitor platelets by anticoagulation protocol: Yes  Date/Time: HL: Comment/Rate: 1/14@0051  0.92 SUPRAtherapeutic@1100  units/hr 1/14@0846  0.76 SUPRAtherapeutic@1000  units/hr 1/14@2026  0.51 1/15@0404      0.45     Therapeutic X 2  1/16@0502      0.51     Therapeutic X 3  1/17@0524      0.46     Therapeutic X 4    Plan:  1/17:  HL @ 0524 = 0.46,  therapeutic X 4  - Will continue pt on current rate and recheck HL on 1/18 with AM labs.  Scherrie Gerlach, PharmD Clinical Pharmacist 07/09/2023 6:26 AM

## 2023-07-09 NOTE — Plan of Care (Signed)
  Problem: Education: Goal: Ability to describe self-care measures that may prevent or decrease complications (Diabetes Survival Skills Education) will improve Outcome: Progressing   Problem: Coping: Goal: Ability to adjust to condition or change in health will improve Outcome: Progressing   Problem: Fluid Volume: Goal: Ability to maintain a balanced intake and output will improve Outcome: Progressing   Problem: Health Behavior/Discharge Planning: Goal: Ability to identify and utilize available resources and services will improve Outcome: Progressing Goal: Ability to manage health-related needs will improve Outcome: Progressing   Problem: Metabolic: Goal: Ability to maintain appropriate glucose levels will improve Outcome: Progressing   Problem: Nutritional: Goal: Maintenance of adequate nutrition will improve Outcome: Not Progressing Goal: Progress toward achieving an optimal weight will improve Outcome: Progressing   Problem: Skin Integrity: Goal: Risk for impaired skin integrity will decrease Outcome: Progressing   Problem: Tissue Perfusion: Goal: Adequacy of tissue perfusion will improve Outcome: Progressing   Problem: Fluid Volume: Goal: Hemodynamic stability will improve Outcome: Not Progressing   Problem: Clinical Measurements: Goal: Diagnostic test results will improve Outcome: Progressing Goal: Signs and symptoms of infection will decrease Outcome: Progressing   Problem: Respiratory: Goal: Ability to maintain adequate ventilation will improve Outcome: Not Progressing   Problem: Education: Goal: Knowledge of General Education information will improve Description: Including pain rating scale, medication(s)/side effects and non-pharmacologic comfort measures Outcome: Progressing   Problem: Health Behavior/Discharge Planning: Goal: Ability to manage health-related needs will improve Outcome: Progressing   Problem: Clinical Measurements: Goal: Ability  to maintain clinical measurements within normal limits will improve Outcome: Progressing Goal: Will remain free from infection Outcome: Progressing Goal: Diagnostic test results will improve Outcome: Progressing Goal: Respiratory complications will improve Outcome: Not Progressing Goal: Cardiovascular complication will be avoided Outcome: Progressing   Problem: Activity: Goal: Risk for activity intolerance will decrease Outcome: Not Progressing   Problem: Nutrition: Goal: Adequate nutrition will be maintained Outcome: Progressing   Problem: Coping: Goal: Level of anxiety will decrease Outcome: Not Progressing   Problem: Elimination: Goal: Will not experience complications related to bowel motility Outcome: Progressing Goal: Will not experience complications related to urinary retention Outcome: Progressing   Problem: Pain Management: Goal: General experience of comfort will improve Outcome: Progressing   Problem: Safety: Goal: Ability to remain free from injury will improve Outcome: Not Progressing   Problem: Skin Integrity: Goal: Risk for impaired skin integrity will decrease Outcome: Progressing   Problem: Activity: Goal: Ability to tolerate increased activity will improve Outcome: Progressing   Problem: Respiratory: Goal: Ability to maintain a clear airway and adequate ventilation will improve Outcome: Progressing   Problem: Role Relationship: Goal: Method of communication will improve Outcome: Progressing

## 2023-07-09 NOTE — Progress Notes (Signed)
SLP Cancellation Note  Patient Details Name: Christy Curry MRN: [id number] DOB: [birth year]   Cancelled treatment:       Reason Eval/Treat Not Completed: Patient at procedure or test/unavailable;Medical issues which prohibited therapy (chart reviewed; consulted NSG re: pt's status)  Upon arriving at room, pt receiving HD at bedside. Pt is also sleepy/lethargic on BIPAP O2 support. NSG reported pt has come off BIPAP 2x today to take sips of liquids but then refused further and needed to return to BIPAP d/t respiratory status. Pt does not appear medically appropriate for assessment at this hour. ST services will f/u next 1-2 days for medical readiness for CSE. NSG updated, agreed.      [name], MS, CCC-SLP Speech Language Pathologist Rehab Services; [facility] [phone] (ascom) Zach Tietje 07/09/2023, 3:23 PM

## 2023-07-09 NOTE — Progress Notes (Signed)
  Received patient in bed to unit.   Informed consent signed and in chart.    TX duration: 3hrs    Hand-off given to patient's nurse. No c/o and no distress noted    Access used: R Femoral HD catheter   Access issues: intermittent poor pull from arterial line   Total UF removed: 1.5L Medication(s) given: none Post HD VS: WNL      Lynann Beaver  [facility]

## 2023-07-09 NOTE — Progress Notes (Signed)
[facility]  ROUNDING NOTE   Subjective:   Patient underwent dialysis yesterday and today.  UF achieved was 1.5 kg. Otherwise resting comfortably in bed.   Objective:  Vital signs in last 24 hours:  Temp:  [98 F (36.7 C)-99.9 F (37.7 C)] 98.4 F (36.9 C) (01/17 1930) Pulse Rate:  [82-117] 84 (01/17 1930) Resp:  [18-40] 31 (01/17 1930) BP: (70-178)/(52-100) 143/68 (01/17 1930) SpO2:  [92 %-100 %] 98 % (01/17 1930) FiO2 (%):  [40 %-60 %] 40 % (01/17 1500) Weight:  [61.4 kg] 61.4 kg (01/17 0500)  Weight change: 0 kg Filed Weights   07/07/23 0330 07/08/23 0345 07/09/23 0500  Weight: 60.9 kg 61.4 kg 61.4 kg    Intake/Output: I/O last 3 completed shifts: In: 921.2 [P.O.:60; I.V.:611.1; IV Piggyback:250.1] Out: 2500 [Other:2500]   Intake/Output this shift:  No intake/output data recorded.  Physical Exam: General: Currently on BiPAP  Head: Normocephalic, atraumatic.  Hearing intact  Neck: Supple  Lungs:  Scattered rhonchi, on BiPAP  Heart: S1S2 no rubs  Abdomen:  Soft, nontender, bowel sounds present  Extremities: Trace peripheral edema.  Neurologic: Awake, alert  Skin: No acute rash  Access: Left upper extremity AV graft, right femoral temporary dialysis catheter    Basic Metabolic Panel: Recent Labs  Lab 07/04/23 1348 07/05/23 0403 07/06/23 0403 07/07/23 0404 07/08/23 0502 07/09/23 0524  NA 140 144 141 136 134* 134*  K 3.8 3.7 4.1 4.1 4.3 4.2  CL 106 104 104 99 97* 95*  CO2 19* 23 23 26 24  21*  GLUCOSE 171* 188* 150* 134* 139* 143*  BUN 28* 35* 47* 36* 27* 24*  CREATININE 7.07* 8.01* 9.09* 5.86* 4.01* 3.79*  CALCIUM 9.1 8.8* 8.0* 8.0* 8.8* 8.6*  MG 2.5*  --  2.2 2.2 2.1 2.2  PHOS  --   --  6.7* 6.3* 5.6* 5.1*    Liver Function Tests: Recent Labs  Lab 07/04/23 1348 07/06/23 0403 07/07/23 0404 07/08/23 0502 07/08/23 1112 07/09/23 0524  AST 23  --  15  --  27  --   ALT 13  --  11  --  12  --   ALKPHOS 81  --  53  --  50  --    BILITOT 0.7  --  0.6  --  0.8  --   PROT 7.2  --  5.5*  --  5.8*  --   ALBUMIN 3.9 2.7* 2.6*  2.6* 3.0* 2.8* 2.8*   Recent Labs  Lab 07/04/23 1348 07/08/23 1112  LIPASE 21 19   No results for input(s): "AMMONIA" in the last 168 hours.  CBC: Recent Labs  Lab 07/04/23 1348 07/05/23 0403 07/06/23 0403 07/06/23 1302 07/07/23 0404 07/08/23 0502 07/09/23 0524  WBC 21.6*   < > 13.2* 12.3* 11.9* 12.3* 6.3  NEUTROABS 18.5*  --  11.6*  --  10.3*  --   --   HGB 11.9*   < > 7.9* 7.8* 7.9* 8.9* 7.8*  HCT 40.5   < > 24.5* 24.2* 24.7* 29.0* 24.6*  MCV 106.9*   < > 97.2 96.8 96.9 99.3 95.7  PLT 295   < > 204 212 220 275 202   < > = values in this interval not displayed.    Cardiac Enzymes: No results for input(s): "CKTOTAL", "CKMB", "CKMBINDEX", "TROPONINI" in the last 168 hours.  BNP: Invalid input(s): "POCBNP"  CBG: Recent Labs  Lab 07/09/23 0357 07/09/23 0758 07/09/23 1121 07/09/23 1559 07/09/23 1929  GLUCAP 133*  150* 132* 130* 151*    Microbiology: Results for orders placed or performed during the hospital encounter of 07/04/23  Resp panel by RT-PCR (RSV, Flu A&B, Covid) Anterior Nasal Swab     Status: Abnormal   Collection Time: 07/04/23  1:48 PM   Specimen: Anterior Nasal Swab  Result Value Ref Range Status   SARS Coronavirus 2 by RT PCR NEGATIVE NEGATIVE Final    Comment: (NOTE) SARS-CoV-2 target nucleic acids are NOT DETECTED.  The SARS-CoV-2 RNA is generally detectable in upper respiratory specimens during the acute phase of infection. The lowest concentration of SARS-CoV-2 viral copies this assay can detect is 138 copies/mL. A negative result does not preclude SARS-Cov-2 infection and should not be used as the sole basis for treatment or other patient management decisions. A negative result may occur with  improper specimen collection/handling, submission of specimen other than nasopharyngeal swab, presence of viral mutation(s) within the areas targeted  by this assay, and inadequate number of viral copies(<138 copies/mL). A negative result must be combined with clinical observations, patient history, and epidemiological information. The expected result is Negative.  Fact Sheet for Patients:  BloggerCourse.com  Fact Sheet for Healthcare Providers:  SeriousBroker.it  This test is no t yet approved or cleared by the [place] FDA and  has been authorized for detection and/or diagnosis of SARS-CoV-2 by FDA under an Emergency Use Authorization (EUA). This EUA will remain  in effect (meaning this test can be used) for the duration of the COVID-19 declaration under Section 564(b)(1) of the Act, 21 U.S.C.section 360bbb-3(b)(1), unless the authorization is terminated  or revoked sooner.       Influenza A by PCR NEGATIVE NEGATIVE Final   Influenza B by PCR NEGATIVE NEGATIVE Final    Comment: (NOTE) The Xpert Xpress SARS-CoV-2/FLU/RSV plus assay is intended as an aid in the diagnosis of influenza from Nasopharyngeal swab specimens and should not be used as a sole basis for treatment. Nasal washings and aspirates are unacceptable for Xpert Xpress SARS-CoV-2/FLU/RSV testing.  Fact Sheet for Patients: BloggerCourse.com  Fact Sheet for Healthcare Providers: SeriousBroker.it  This test is not yet approved or cleared by the [place] FDA and has been authorized for detection and/or diagnosis of SARS-CoV-2 by FDA under an Emergency Use Authorization (EUA). This EUA will remain in effect (meaning this test can be used) for the duration of the COVID-19 declaration under Section 564(b)(1) of the Act, 21 U.S.C. section 360bbb-3(b)(1), unless the authorization is terminated or revoked.     Resp Syncytial Virus by PCR POSITIVE (A) NEGATIVE Final    Comment: (NOTE) Fact Sheet for Patients: BloggerCourse.com  Fact  Sheet for Healthcare Providers: SeriousBroker.it  This test is not yet approved or cleared by the [place] FDA and has been authorized for detection and/or diagnosis of SARS-CoV-2 by FDA under an Emergency Use Authorization (EUA). This EUA will remain in effect (meaning this test can be used) for the duration of the COVID-19 declaration under Section 564(b)(1) of the Act, 21 U.S.C. section 360bbb-3(b)(1), unless the authorization is terminated or revoked.  Performed at [facility], [address] [place], [place] [address]   Blood Culture (routine x 2)     Status: None   Collection Time: 07/04/23  3:41 PM   Specimen: BLOOD  Result Value Ref Range Status   Specimen Description BLOOD BLOOD RIGHT WRIST  Final   Special Requests   Final    BOTTLES DRAWN AEROBIC AND ANAEROBIC Blood Culture adequate volume  Culture   Final    NO GROWTH 5 DAYS Performed at [facility], [address] [place] [place], [place] [address]    Report Status 07/09/2023 FINAL  Final  Blood Culture (routine x 2)     Status: None   Collection Time: 07/04/23  3:41 PM   Specimen: BLOOD  Result Value Ref Range Status   Specimen Description BLOOD BLOOD RIGHT HAND  Final   Special Requests   Final    BOTTLES DRAWN AEROBIC AND ANAEROBIC Blood Culture adequate volume   Culture   Final    NO GROWTH 5 DAYS Performed at [facility], [address], [place], [place] [address]    Report Status 07/09/2023 FINAL  Final  MRSA Next Gen by PCR, Nasal     Status: None   Collection Time: 07/04/23  6:20 PM   Specimen: Nasal Mucosa; Nasal Swab  Result Value Ref Range Status   MRSA by PCR Next Gen NOT DETECTED NOT DETECTED Final    Comment: (NOTE) The GeneXpert MRSA Assay (FDA approved for NASAL specimens only), is one component of a comprehensive MRSA colonization surveillance program. It is not intended to diagnose MRSA infection nor to guide or monitor treatment  for MRSA infections. Test performance is not FDA approved in patients less than 4 years old. Performed at [facility], [address] [place], [place] [address]   Culture, Respiratory w Gram Stain     Status: None   Collection Time: 07/05/23  8:07 AM   Specimen: Tracheal Aspirate; Respiratory  Result Value Ref Range Status   Specimen Description   Final    TRACHEAL ASPIRATE Performed at [facility], [address], [place], [place] [address]    Special Requests   Final    NONE Performed at [facility], [address] [place], [place] [address]    Gram Stain   Final    MODERATE WBC PRESENT, PREDOMINANTLY PMN RARE GRAM POSITIVE COCCI IN PAIRS    Culture   Final    NO GROWTH 2 DAYS Performed at [facility], [address], [place], [place] [address]    Report Status 07/08/2023 FINAL  Final  Culture, Respiratory w Gram Stain     Status: None   Collection Time: 07/05/23  3:26 PM   Specimen: Bronchoalveolar Lavage; Respiratory  Result Value Ref Range Status   Specimen Description   Final    BRONCHIAL ALVEOLAR LAVAGE Performed at [facility], [address] [place], [place] [address]    Special Requests   Final    NONE Performed at [facility], [address] [place], [place] [address]    Gram Stain   Final    FEW WBC PRESENT, PREDOMINANTLY PMN NO ORGANISMS SEEN    Culture   Final    RARE Normal respiratory flora-no Staph aureus or Pseudomonas seen Performed at [facility], [address] [place], [place] [address]    Report Status 07/08/2023 FINAL  Final    Coagulation Studies: No results for input(s): "LABPROT", "INR" in the last 72 hours.   Urinalysis: No results for input(s): "COLORURINE", "LABSPEC", "PHURINE", "GLUCOSEU", "HGBUR", "BILIRUBINUR", "KETONESUR", "PROTEINUR", "UROBILINOGEN", "NITRITE", "LEUKOCYTESUR" in the last 72 hours.  Invalid input(s): "APPERANCEUR"     Imaging: DG  Abd 1 View Result Date: 07/08/2023 CLINICAL DATA:  Nausea and vomiting. EXAM: ABDOMEN - 1 VIEW COMPARISON:  Abdominal radiograph dated 07/06/2023 FINDINGS: Interval removal of the enteric tube. There  is moderate stool throughout the colon. No bowel dilatation or evidence of obstruction. No free air or radiopaque calculi. The osseous structures are intact. Bilateral tubal ligation clips. Partially visualized right femoral catheter in similar position. Small bilateral pleural effusions and bibasilar atelectasis or infiltrate. IMPRESSION: Nonobstructive bowel gas pattern. Electronically Signed   By: Elgie Collard M.D.   On: 07/08/2023 11:01   DG Chest Port 1 View Result Date: 07/08/2023 CLINICAL DATA:  68 year old female with history of hypoxia. EXAM: PORTABLE CHEST 1 VIEW COMPARISON:  Chest x-ray 07/06/2023. FINDINGS: Patient has been extubated. Nasogastric tube has been removed. Lung volumes are very low. Diffuse interstitial prominence, widespread peribronchial cuffing and patchy multifocal airspace consolidation noted throughout the lungs bilaterally, most confluent throughout the right mid to lower lung and left lung base. Overall, aeration has substantially improved compared to the prior study, particularly in the region of the right upper lobe. Small bilateral pleural effusions. No pneumothorax. Heart size appears borderline enlarged. Upper mediastinal contours are within normal limits. Atherosclerotic calcifications in the thoracic aorta. Vascular stent noted in the left axillary/subclavian region. IMPRESSION: 1. Support apparatus, as above. 2. Severe but improving multilobar bilateral pneumonia, as above. 3. Small bilateral pleural effusions. 4. Aortic atherosclerosis. Electronically Signed   By: Trudie Reed M.D.   On: 07/08/2023 05:20     Medications:    dexmedetomidine (PRECEDEX) IV infusion 0.4 mcg/kg/hr (07/09/23 1900)   heparin 900 Units/hr (07/09/23 1900)   nitroGLYCERIN 30 mcg/min  (07/09/23 1900)   piperacillin-tazobactam Stopped (07/09/23 1840)   promethazine (PHENERGAN) injection (IM or IVPB) Stopped (07/08/23 0339)    aspirin EC  81 mg Oral Daily   atorvastatin  80 mg Oral QHS   [START ON 07/10/2023] carvedilol  25 mg Oral BID WC   Chlorhexidine Gluconate Cloth  6 each Topical Daily   cloNIDine  0.1 mg Transdermal Weekly   [START ON 07/10/2023] ezetimibe  10 mg Oral Daily   insulin aspart  0-6 Units Subcutaneous Q4H   ipratropium-albuterol  3 mL Nebulization Q4H   multivitamin  1 tablet Oral QHS   pantoprazole (PROTONIX) IV  40 mg Intravenous Daily   [START ON 07/10/2023] predniSONE  30 mg Oral Q breakfast   Followed by   Melene Muller ON 07/13/2023] predniSONE  20 mg Oral Q breakfast   Followed by   Melene Muller ON 07/16/2023] predniSONE  10 mg Oral Q breakfast   [START ON 07/10/2023] sevelamer carbonate  0.8 g Oral TID WC   alteplase, docusate sodium, heparin, hydrALAZINE, ipratropium-albuterol, labetalol, menthol-cetylpyridinium, mouth rinse, polyethylene glycol, promethazine (PHENERGAN) injection (IM or IVPB)  Assessment/ Plan:  68 y.o. female with past medical history of diabetes mellitus type 2, peripheral neuropathy, GERD, hypertension, aortic stenosis, anemia of chronic kidney disease, peripheral arterial disease, ESRD on HD MWF who presented with shortness of breath and found to have RSV infection.  [facility]/MWF/[facility] [address]/left upper extremity AV graft  1.  ESRD on HD MWF.  Completed dialysis treatment today.  UF achieved was 1.5 kg.  Next dialysis will be scheduled for Monday unless she develops a respiratory issue.  2.  Acute respiratory failure secondary to RSV infection.  Extubated 07/07/2023.  Continues on BiPAP at the moment.  3.  Anemia of chronic kidney disease.  Lab Results  Component Value Date   HGB 7.8 (L) 07/09/2023   Consider starting Epogen next week.  4.  Secondary hyperparathyroidism.  Phosphorus within target range at 5.1.  Maintain the  patient on Renvela 0.8 g 3  times daily.   LOS: 5 Caris Cerveny 1/17/20257:40 PM

## 2023-07-09 NOTE — Progress Notes (Signed)
Progress Note  Patient Name: Christy Curry Date of Encounter: 07/09/2023  Primary Cardiologist: Mariah Milling  Subjective   Continues to require BiPAP.  Daughter at bedside.  Her daughter reports the patient was complaining of generalized body pain last evening and overnight.  This morning, patient denies chest pain, palpitations, dizziness, presyncope, or syncope.  Patient continues to report she does not want a cardiac cath at [facility] and wants to pursue cath as a scheduled at [facility] next month.  High-sensitivity troponin ultimately peaked at 5054, now down trending.  Inpatient Medications    Scheduled Meds:  aspirin EC  81 mg Oral Daily   atorvastatin  80 mg Per Tube QHS   carvedilol  25 mg Per Tube BID WC   Chlorhexidine Gluconate Cloth  6 each Topical Daily   cloNIDine  0.1 mg Transdermal Weekly   ezetimibe  10 mg Per Tube Daily   insulin aspart  0-6 Units Subcutaneous Q4H   ipratropium-albuterol  3 mL Nebulization Q3H   multivitamin  1 tablet Per Tube QHS   pantoprazole (PROTONIX) IV  40 mg Intravenous Daily   predniSONE  40 mg Per Tube Q breakfast   sevelamer carbonate  0.8 g Per Tube TID WC   Continuous Infusions:  dexmedetomidine (PRECEDEX) IV infusion 0.3 mcg/kg/hr (07/09/23 0904)   heparin 900 Units/hr (07/09/23 0900)   nitroGLYCERIN 25 mcg/min (07/09/23 0903)   piperacillin-tazobactam 2.25 g (07/09/23 0128)   promethazine (PHENERGAN) injection (IM or IVPB) Stopped (07/08/23 0339)   PRN Meds: alteplase, heparin, hydrALAZINE, ipratropium-albuterol, labetalol, menthol-cetylpyridinium, ondansetron **OR** ondansetron (ZOFRAN) IV, mouth rinse, promethazine (PHENERGAN) injection (IM or IVPB)   Vital Signs    Vitals:   07/09/23 0730 07/09/23 0756 07/09/23 0800 07/09/23 0830  BP: (!) 151/68  (!) 156/72 136/67  Pulse: 89  92 94  Resp: (!) 27  (!) 28 (!) 27  Temp:   99.2 F (37.3 C)   TempSrc:   Axillary   SpO2: 100% 100% 100% 100%  Weight:      Height:         Intake/Output Summary (Last 24 hours) at 07/09/2023 0939 Last data filed at 07/09/2023 0900 Gross per 24 hour  Intake 506.64 ml  Output 1000 ml  Net -493.36 ml   Filed Weights   07/07/23 0330 07/08/23 0345 07/09/23 0500  Weight: 60.9 kg 61.4 kg 61.4 kg    Telemetry    Sinus rhythm with sinus tachycardia with rates in the 90s to 1 teens bpm - Personally Reviewed  ECG    NSR, 99 bpm, RBBB, nonspecific ST-T changes - Personally Reviewed  Physical Exam   GEN: Ill-appearing, no acute distress.   Neck: JVD difficult to assess secondary to respiratory support apparatus. Cardiac: RRR, II/VI systolic murmur RUSB, no rubs, or gallops.  Respiratory: Diminished coarse breath sounds bilaterally.  GI: Soft, nontender, non-distended.   MS: No edema; No deformity. Neuro: Somnolent; Nonfocal.  Psych: Normal affect, responds to questions appropriately.  Labs    Chemistry Recent Labs  Lab 07/04/23 1348 07/05/23 0403 07/07/23 0404 07/08/23 0502 07/08/23 1112 07/09/23 0524  NA 140   < > 136 134*  --  134*  K 3.8   < > 4.1 4.3  --  4.2  CL 106   < > 99 97*  --  95*  CO2 19*   < > 26 24  --  21*  GLUCOSE 171*   < > 134* 139*  --  143*  BUN 28*   < >  36* 27*  --  24*  CREATININE 7.07*   < > 5.86* 4.01*  --  3.79*  CALCIUM 9.1   < > 8.0* 8.8*  --  8.6*  PROT 7.2  --  5.5*  --  5.8*  --   ALBUMIN 3.9   < > 2.6*  2.6* 3.0* 2.8* 2.8*  AST 23  --  15  --  27  --   ALT 13  --  11  --  12  --   ALKPHOS 81  --  53  --  50  --   BILITOT 0.7  --  0.6  --  0.8  --   GFRNONAA 6*   < > 7* 12*  --  12*  ANIONGAP 15   < > 11 13  --  18*   < > = values in this interval not displayed.     Hematology Recent Labs  Lab 07/07/23 0404 07/08/23 0502 07/09/23 0524  WBC 11.9* 12.3* 6.3  RBC 2.55* 2.92* 2.57*  HGB 7.9* 8.9* 7.8*  HCT 24.7* 29.0* 24.6*  MCV 96.9 99.3 95.7  MCH 31.0 30.5 30.4  MCHC 32.0 30.7 31.7  RDW 14.9 14.6 14.4  PLT 220 275 202    Cardiac EnzymesNo results for  input(s): "TROPONINI" in the last 168 hours. No results for input(s): "TROPIPOC" in the last 168 hours.   BNPNo results for input(s): "BNP", "PROBNP" in the last 168 hours.   DDimer No results for input(s): "DDIMER" in the last 168 hours.   Radiology    DG Abd 1 View Result Date: 07/08/2023 IMPRESSION: Nonobstructive bowel gas pattern. Electronically Signed   By: Elgie Collard M.D.   On: 07/08/2023 11:01   DG Chest Port 1 View Result Date: 07/08/2023 IMPRESSION: 1. Support apparatus, as above. 2. Severe but improving multilobar bilateral pneumonia, as above. 3. Small bilateral pleural effusions. 4. Aortic atherosclerosis. Electronically Signed   By: Trudie Reed M.D.   On: 07/08/2023 05:20    Cardiac Studies   2D echo 07/06/2023: 1. Left ventricular ejection fraction, by estimation, is 50 to 55%. The  left ventricle has low normal function. Left ventricular endocardial  border not optimally defined to evaluate regional wall motion. There is  mild left ventricular hypertrophy. Left  ventricular diastolic parameters are consistent with Grade II diastolic  dysfunction (pseudonormalization). Elevated left atrial pressure.   2. Right ventricular systolic function is normal. The right ventricular  size is normal. There is normal pulmonary artery systolic pressure.   3. The mitral valve is degenerative. Mild to moderate mitral valve  regurgitation. Moderate mitral annular calcification.   4. Tricuspid valve regurgitation is mild to moderate.   5. The aortic valve has an indeterminant number of cusps. There is severe  calcifcation of the aortic valve. There is moderate thickening of the  aortic valve. Aortic valve regurgitation is trivial. Severe aortic valve  stenosis. Aortic valve area, by VTI  measures 0.70 cm. Aortic valve mean gradient measures 47.0 mmHg.   6. The inferior vena cava is normal in size with <50% respiratory  variability, suggesting right atrial pressure of 8  mmHg.  Patient Profile     68 y.o. 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   Assessment & Plan    1. Acute hypoxic respiratory failure: -She was admitted on 07/04/2023 with acute hypoxic respiratory failure requiring mechanical ventilation in the setting of RSV pneumonia with possible superimposed bacterial pneumonia and mucous plugging s/p extubation on 07/05/2022 with course complicated by aspiration pneumonia requiring reintubation on 07/05/2022, now extubated on 07/07/2023 -Management per CCM   2. Severe aortic stenosis: -Echo this admission with severe aortic stenosis with a mean gradient of 47 mmHg and a valve area 0.71 cm.  This is progression from echo obtained at [facility] in 02/2023 at which time she had a mean gradient of 29 mmHg and a valve area of 1.0 cm.  It is unclear if some of this change is in the setting of her acute illness -Do not suspect that this is the primary driving force of her respiratory failure, though it is certainly contributory -She will require further evaluation with R/LHC as outlined below once she is improved from her acute pulmonary illness   3.  CAD with NSTEMI: -No chest pain -Initially noted to have mildly elevated  high-sensitivity troponin felt to be reflective of supply/demand ischemia secondary to severe acute pulmonary illness, severe aortic stenosis, anemia of chronic disease, and ESRD.  On 1/16 if she had further elevation of troponin peaking at 5054.  Patient continued to deny symptoms of chest pain at that time.  It is unclear if this is reflective of ongoing supply/demand ischemia in the setting of her acute pulmonary illness requiring intubation with subsequent reintubation versus NSTEMI.  Fortunately, she continues to remain chest pain-free -Would continue heparin drip for total of 48 hours from 1/16 given uptrending troponin at that time -Echo with low normal LV systolic function -She prefers to pursue cardiac cath as a scheduled with [facility] next month, given that she is without symptoms of angina, and continues to require BiPAP in the setting of her acute pulmonary illness there is no emergent indication for cardiac cath at this time.  However, if she becomes unstable this would need to be revisited -Continue aspirin, atorvastatin, ezetimibe, and carvedilol -Would recommend weaning off nitro drip      For questions or updates, please contact [facility] Please consult [email or web address] for contact info under Cardiology/STEMI.    Signed, Eula Listen, PA-C [facility] Pager: [phone] 07/09/2023, 9:39 AM

## 2023-07-09 NOTE — Progress Notes (Signed)
NAME:  Christy Curry, MRN:  [id number], DOB:  [birth year], LOS: 5 ADMISSION DATE:  07/04/2023 History of Present Illness:    Case of 68 year old female patient with a past medical history of peripheral artery disease, type 2 diabetes mellitus, diabetic neuropathy, GERD, hypertension, aortic stenosis, anemia of CKD, end-stage renal disease on HD via left AV fistula MWF currently on the kidney transplant list at [facility] who presented to [facility] on 01/12 with shortness of breath.   History taken mostly from chart review.   She presents from home with weakness, shortness of breath, cough for couple of days.  Initially on BiPAP with SpO2 of 100%.  Hypertensive and tachycardic.  Labs with leukocytosis neutrophilic predominant 21.6.  H&H stable.  Electrolytes unremarkable.  Creatinine 7.7 g/dL and BUN 28.  Troponin was noted to be elevated initially at 100 and subsequently at 795.  Over the course of the night her respiratory status got worse required intubation and mechanical ventilation.   Chest x-ray with multifocal pneumonia.  Chest x-ray postintubation with possible mucous plugging of the right upper lobe.  RSV positive.    Pertinent  Medical History  -- Hypertension on Amlodipine, Carvedilol, Clonidine, losartan -- Type II DM Glipizide -- HLD on atorvastatin and ezetimibe -- GERD on PPI  -- ESRD on HD, Sevelamer  Significant Hospital Events: Including procedures, antibiotic start and stop dates in addition to other pertinent events   07/04/2022 Intubation and MV, s/p bronchoscopy and airway clearance.  07/05/2022 Extubated but unfortunately had a multiple vomiting events through the day and night --> significant aspiration event requiring reintubation 07/05/2022 at night.  07/06/2022 Tolerated SBT well. This AM and overnight, she went into moderate respiratory distress. Requiring bipap support. Appears to be doing well on bipap. 07/07/2022 Patient with respiratory distress overnight,  hypertension and tachycardic. Unable to take PO meds. Started on Nitrodrip, IV metoprolol x1 and started on clonidine patch.   Interim History / Subjective:  Awake alert and following commands. Appears to be doing well on bipap.   Objective   Blood pressure (!) 156/72, pulse 92, temperature 99.2 F (37.3 C), temperature source Axillary, resp. rate (!) 28, height 5\' 1"  (1.549 m), weight 61.4 kg, SpO2 100%.    FiO2 (%):  [45 %-60 %] 60 % PEEP:  [5 cmH20] 5 cmH20 Pressure Support:  [10 cmH20] 10 cmH20   Intake/Output Summary (Last 24 hours) at 07/09/2023 0835 Last data filed at 07/09/2023 0800 Gross per 24 hour  Intake 483.29 ml  Output --  Net 483.29 ml   Filed Weights   07/07/23 0330 07/08/23 0345 07/09/23 0500  Weight: 60.9 kg 61.4 kg 61.4 kg    Examination: GEN On Bipap, awake and alert. Comfortable on Bipap.  HEENT supple neck, reactive pupils CVS loud systolic murmur heard best at the right sternal border Lungs diminished over the right side.  Diffuse rhonchi. Abdomen soft nontender nondistended positive bowel sounds Extremities warm well-perfused no edema   Labs and imaging were reviewed  Back to minimal vent settings.   Assessment & Plan:  Case of 68 year old female patient with a past medical history of peripheral artery disease, type 2 diabetes mellitus, diabetic neuropathy, GERD, hypertension, aortic stenosis, anemia of CKD, end-stage renal disease on HD via left AV fistula MWF currently on the kidney transplant list at [facility] who presented to [facility] on 01/12 with shortness of breath.  Found to be in acute hypoxic respiratory failure requiring intubation and mechanical ventilation secondary to RSV  pneumonia possible superimposed bacterial pneumonia and mucous plugging.   #Acute hypoxic respiratory failure requiring intubation and mechanical ventilation 07/05/2023 secondary to.... #RSV pneumonia with possible superimposed bacterial pneumonia and mucous plugging s/p  extubation 07/05/2022 with course complicated by aspiration event requring re-intubation 07/05/2022 at night and now re-extubated 07/06/2022.  #Type II NSTEMI secondary to demand #History of end-stage renal disease on HD MWF via left AV fistula #Severe aortic stenosis with AV area surface 0.68 cm. MG 47 mmHg.  #Hypertension, hyperlipidemia and type 2 diabetes mellitus   She is improving from a respiratory standpoint however she is still having these episodes of respiratory distress I think majorly driven by her severe aortic stenosis with area 0.6 cm2 and MG 57, hypertension and possible flash pulmonary edema. There might be a component of bronchoconstriction as well.    Neuro: Precedex.  CVS: Goal MAP > = 65. C/w clonidine patch, dc IV metoprolol and give carvedilol. Hold home losartan. Appreciate cards recs.  Lungs: MRSA swab -, RSV +, Trach aspirate remains negative. Completed Azithromycin. Switch Ceftriaxone to Zosyn given inhospital aspiration event. Decrease Prednisone to 30mg  in am per [facility] COD.  GI: Clear liquid diet. PPI daily. Lax as needed.   Renal: Renal onboard. HD today  c/w home sevelamer.  Heme: Heparin drip  Endo: ISS POC goal 140-180   Best Practice (right click and "Reselect all SmartList Selections" daily)   Diet/type: NPO now but can trial diet when tolerated.  DVT prophylaxis systemic heparin Pressure ulcer(s): N/A GI prophylaxis: PPI Lines: Dialysis Catheter Foley:  N/A Code Status:  full code  Last date of multidisciplinary goals of care discussion [07/06/2023]  I spent 40 minutes caring for this patient today, including preparing to see the patient, obtaining a medical history , reviewing a separately obtained history, performing a medically appropriate examination and/or evaluation, ordering medications, tests, or procedures, documenting clinical information in the electronic health record, and independently interpreting results (not separately reported/billed)  and communicating results to the patient/family/caregiver  Janann Colonel, MD [facility] 07/09/2023 8:35 AM

## 2023-07-09 NOTE — Progress Notes (Addendum)
0730 patient on BIPAP sleeping daughter at bedside due to light and morning patient had will wait to wake up patient. Hep nitroglycerin Precedex running 0800 Cardiology at bedside patient able to answer question while on BIPAP daughter ask that BIPAP not be removed at this time and allow patient to sleep 0900 Patient responds to name eye ope follows all commands able to roll self in bed. BIPAP removed for oral care and AM meds pt placed on 15L [place] after meds and oral care BIPAP placed back on per patient and family request  1800 patient has not wanted to have much oral intake if any all day was placed on clear liquids after tolerating being off BIPAP patient did wear BIPAP on and off after 12pm Patient tolerated HD well. Patient remains on Nitroglycerin gtt heparin gtt and on Precedex gtt for BP and HR and breathing. With Precedex was able to titrate patient to 3L [place] today.

## 2023-07-10 DIAGNOSIS — I480 Paroxysmal atrial fibrillation: Secondary | ICD-10-CM

## 2023-07-10 DIAGNOSIS — R7989 Other specified abnormal findings of blood chemistry: Secondary | ICD-10-CM | POA: Diagnosis not present

## 2023-07-10 DIAGNOSIS — R918 Other nonspecific abnormal finding of lung field: Secondary | ICD-10-CM

## 2023-07-10 DIAGNOSIS — I2510 Atherosclerotic heart disease of native coronary artery without angina pectoris: Secondary | ICD-10-CM | POA: Diagnosis not present

## 2023-07-10 DIAGNOSIS — I2583 Coronary atherosclerosis due to lipid rich plaque: Secondary | ICD-10-CM

## 2023-07-10 DIAGNOSIS — I350 Nonrheumatic aortic (valve) stenosis: Secondary | ICD-10-CM

## 2023-07-10 DIAGNOSIS — I214 Non-ST elevation (NSTEMI) myocardial infarction: Secondary | ICD-10-CM

## 2023-07-10 DIAGNOSIS — I10 Essential (primary) hypertension: Secondary | ICD-10-CM

## 2023-07-10 LAB — RENAL FUNCTION PANEL
Albumin: 2.6 g/dL — ABNORMAL LOW (ref 3.5–5.0)
Anion gap: 13 (ref 5–15)
BUN: 29 mg/dL — ABNORMAL HIGH (ref 8–23)
CO2: 25 mmol/L (ref 22–32)
Calcium: 9 mg/dL (ref 8.9–10.3)
Chloride: 95 mmol/L — ABNORMAL LOW (ref 98–111)
Creatinine, Ser: 3.67 mg/dL — ABNORMAL HIGH (ref 0.44–1.00)
GFR, Estimated: 13 mL/min — ABNORMAL LOW (ref >60)
Glucose, Bld: 148 mg/dL — ABNORMAL HIGH (ref 70–99)
Phosphorus: 4.9 mg/dL — ABNORMAL HIGH (ref 2.5–4.6)
Potassium: 3.7 mmol/L (ref 3.5–5.1)
Sodium: 133 mmol/L — ABNORMAL LOW (ref 135–145)

## 2023-07-10 LAB — CBC
HCT: 21.7 % — ABNORMAL LOW (ref 36.0–46.0)
Hemoglobin: 7.1 g/dL — ABNORMAL LOW (ref 12.0–15.0)
MCH: 30.9 pg (ref 26.0–34.0)
MCHC: 32.7 g/dL (ref 30.0–36.0)
MCV: 94.3 fL (ref 80.0–100.0)
Platelets: 179 10*3/uL (ref 150–400)
RBC: 2.3 MIL/uL — ABNORMAL LOW (ref 3.87–5.11)
RDW: 14.2 % (ref 11.5–15.5)
WBC: 4.9 10*3/uL (ref 4.0–10.5)
nRBC: 0 % (ref 0.0–0.2)

## 2023-07-10 LAB — GLUCOSE, CAPILLARY
Glucose-Capillary: 140 mg/dL — ABNORMAL HIGH (ref 70–99)
Glucose-Capillary: 156 mg/dL — ABNORMAL HIGH (ref 70–99)
Glucose-Capillary: 169 mg/dL — ABNORMAL HIGH (ref 70–99)
Glucose-Capillary: 174 mg/dL — ABNORMAL HIGH (ref 70–99)
Glucose-Capillary: 194 mg/dL — ABNORMAL HIGH (ref 70–99)
Glucose-Capillary: 237 mg/dL — ABNORMAL HIGH (ref 70–99)

## 2023-07-10 LAB — HEPARIN LEVEL (UNFRACTIONATED): Heparin Unfractionated: 0.5 [IU]/mL (ref 0.30–0.70)

## 2023-07-10 MED ORDER — CLONIDINE HCL 0.1 MG PO TABS
0.2000 mg | ORAL_TABLET | Freq: Two times a day (BID) | ORAL | Status: DC
  Administered 2023-07-10 – 2023-07-12 (×5): 0.2 mg via ORAL
  Filled 2023-07-10 (×5): qty 2

## 2023-07-10 MED ORDER — ORAL CARE MOUTH RINSE: 15.0000 mL | OROMUCOSAL | Status: DC | PRN

## 2023-07-10 MED ORDER — LOSARTAN POTASSIUM 50 MG PO TABS
50.0000 mg | ORAL_TABLET | Freq: Every day | ORAL | Status: DC
  Administered 2023-07-10 – 2023-07-11 (×2): 50 mg via ORAL
  Filled 2023-07-10 (×2): qty 1

## 2023-07-10 MED ORDER — ACETAMINOPHEN 325 MG PO TABS
650.0000 mg | ORAL_TABLET | Freq: Four times a day (QID) | ORAL | Status: DC | PRN
  Administered 2023-07-10 – 2023-07-11 (×3): 650 mg via ORAL
  Filled 2023-07-10 (×4): qty 2

## 2023-07-10 MED ORDER — BISACODYL 10 MG RE SUPP
10.0000 mg | Freq: Every day | RECTAL | Status: DC | PRN
  Administered 2023-07-10: 10 mg via RECTAL
  Filled 2023-07-10: qty 1

## 2023-07-10 MED ORDER — MORPHINE SULFATE (PF) 2 MG/ML IV SOLN
1.0000 mg | Freq: Once | INTRAVENOUS | Status: AC
Start: 2023-07-10 — End: 2023-07-10
  Administered 2023-07-10: 1 mg via INTRAVENOUS

## 2023-07-10 MED ORDER — NEPRO/CARBSTEADY PO LIQD
237.0000 mL | Freq: Two times a day (BID) | ORAL | Status: DC
  Administered 2023-07-10 – 2023-07-12 (×6): 237 mL via ORAL

## 2023-07-10 MED ORDER — HYDRALAZINE HCL 20 MG/ML IJ SOLN
10.0000 mg | INTRAMUSCULAR | Status: DC | PRN
  Administered 2023-07-10 – 2023-07-12 (×5): 20 mg via INTRAVENOUS
  Filled 2023-07-10 (×5): qty 1

## 2023-07-10 MED ORDER — MORPHINE SULFATE (PF) 2 MG/ML IV SOLN
2.0000 mg | INTRAVENOUS | Status: DC | PRN
  Administered 2023-07-11 – 2023-07-12 (×2): 2 mg via INTRAVENOUS
  Filled 2023-07-10 (×3): qty 1

## 2023-07-10 MED ORDER — LABETALOL HCL 5 MG/ML IV SOLN: 10.0000 mg | INTRAVENOUS | Status: DC | PRN

## 2023-07-10 NOTE — Progress Notes (Signed)
[facility]  ROUNDING NOTE   Subjective:  Christy Curry is a 68yo f with PMH of ESRD on HD, CAD, HTN, AS, HLD that came in to the hospital for shortness of breath, found to have elevated troponin with RSV in the setting of acute hypoxic respiratory failure. Patient home dialysis at [facility] [address] MWF, followed by Dr. Cherylann Ratel. Update: Patient resting in bed comfortably, daughter a bedside. Patient denies shortness of breath, denies nause or pain.    Objective:  Vital signs in last 24 hours:  Temp:  [98 F (36.7 C)-99.9 F (37.7 C)] 98.7 F (37.1 C) (01/18 0820) Pulse Rate:  [64-103] 74 (01/18 1200) Resp:  [14-36] 32 (01/18 1200) BP: (109-188)/(51-89) 114/52 (01/18 1200) SpO2:  [88 %-100 %] 96 % (01/18 1200) FiO2 (%):  [40 %] 40 % (01/17 1500)  Weight change:  Filed Weights   07/07/23 0330 07/08/23 0345 07/09/23 0500  Weight: 60.9 kg 61.4 kg 61.4 kg    Intake/Output: I/O last 3 completed shifts: In: 1091.7 [P.O.:60; I.V.:831.6; IV Piggyback:200.1] Out: 1500 [Other:1500]   Intake/Output this shift:  Total I/O In: 410.8 [P.O.:240; I.V.:120.8; IV Piggyback:50] Out: -   Physical Exam: General: NAD,   Head: Normocephalic, atraumatic. Moist oral mucosal membranes  Eyes: Anicteric, PERRL  Neck: Supple, trachea midline  Lungs:  Trace bilateral crackles to auscultation  Heart: Regular rate and rhythm  Abdomen:  Soft, nontender,   Extremities:  No peripheral edema.  Neurologic: Nonfocal, moving all four extremities  Skin: No lesions  Access: Left AVF    Basic Metabolic Panel: Recent Labs  Lab 07/04/23 1348 07/05/23 0403 07/06/23 0403 07/07/23 0404 07/08/23 0502 07/09/23 0524 07/10/23 0502  NA 140   < > 141 136 134* 134* 133*  K 3.8   < > 4.1 4.1 4.3 4.2 3.7  CL 106   < > 104 99 97* 95* 95*  CO2 19*   < > 23 26 24  21* 25  GLUCOSE 171*   < > 150* 134* 139* 143* 148*  BUN 28*   < > 47* 36* 27* 24* 29*  CREATININE 7.07*   < > 9.09* 5.86*  4.01* 3.79* 3.67*  CALCIUM 9.1   < > 8.0* 8.0* 8.8* 8.6* 9.0  MG 2.5*  --  2.2 2.2 2.1 2.2  --   PHOS  --   --  6.7* 6.3* 5.6* 5.1* 4.9*   < > = values in this interval not displayed.    Liver Function Tests: Recent Labs  Lab 07/04/23 1348 07/06/23 0403 07/07/23 0404 07/08/23 0502 07/08/23 1112 07/09/23 0524 07/10/23 0502  AST 23  --  15  --  27  --   --   ALT 13  --  11  --  12  --   --   ALKPHOS 81  --  53  --  50  --   --   BILITOT 0.7  --  0.6  --  0.8  --   --   PROT 7.2  --  5.5*  --  5.8*  --   --   ALBUMIN 3.9   < > 2.6*  2.6* 3.0* 2.8* 2.8* 2.6*   < > = values in this interval not displayed.   Recent Labs  Lab 07/04/23 1348 07/08/23 1112  LIPASE 21 19   No results for input(s): "AMMONIA" in the last 168 hours.  CBC: Recent Labs  Lab 07/04/23 1348 07/05/23 0403 07/06/23 0403 07/06/23 1302 07/07/23 0404 07/08/23  0502 07/09/23 0524 07/10/23 0502  WBC 21.6*   < > 13.2* 12.3* 11.9* 12.3* 6.3 4.9  NEUTROABS 18.5*  --  11.6*  --  10.3*  --   --   --   HGB 11.9*   < > 7.9* 7.8* 7.9* 8.9* 7.8* 7.1*  HCT 40.5   < > 24.5* 24.2* 24.7* 29.0* 24.6* 21.7*  MCV 106.9*   < > 97.2 96.8 96.9 99.3 95.7 94.3  PLT 295   < > 204 212 220 275 202 179   < > = values in this interval not displayed.    Cardiac Enzymes: No results for input(s): "CKTOTAL", "CKMB", "CKMBINDEX", "TROPONINI" in the last 168 hours.  BNP: Invalid input(s): "POCBNP"  CBG: Recent Labs  Lab 07/09/23 1929 07/09/23 2326 07/10/23 0321 07/10/23 0737 07/10/23 1120  GLUCAP 151* 146* 156* 140* 169*    Microbiology: Results for orders placed or performed during the hospital encounter of 07/04/23  Resp panel by RT-PCR (RSV, Flu A&B, Covid) Anterior Nasal Swab     Status: Abnormal   Collection Time: 07/04/23  1:48 PM   Specimen: Anterior Nasal Swab  Result Value Ref Range Status   SARS Coronavirus 2 by RT PCR NEGATIVE NEGATIVE Final    Comment: (NOTE) SARS-CoV-2 target nucleic acids are NOT  DETECTED.  The SARS-CoV-2 RNA is generally detectable in upper respiratory specimens during the acute phase of infection. The lowest concentration of SARS-CoV-2 viral copies this assay can detect is 138 copies/mL. A negative result does not preclude SARS-Cov-2 infection and should not be used as the sole basis for treatment or other patient management decisions. A negative result may occur with  improper specimen collection/handling, submission of specimen other than nasopharyngeal swab, presence of viral mutation(s) within the areas targeted by this assay, and inadequate number of viral copies(<138 copies/mL). A negative result must be combined with clinical observations, patient history, and epidemiological information. The expected result is Negative.  Fact Sheet for Patients:  BloggerCourse.com  Fact Sheet for Healthcare Providers:  SeriousBroker.it  This test is no t yet approved or cleared by the [place] FDA and  has been authorized for detection and/or diagnosis of SARS-CoV-2 by FDA under an Emergency Use Authorization (EUA). This EUA will remain  in effect (meaning this test can be used) for the duration of the COVID-19 declaration under Section 564(b)(1) of the Act, 21 U.S.C.section 360bbb-3(b)(1), unless the authorization is terminated  or revoked sooner.       Influenza A by PCR NEGATIVE NEGATIVE Final   Influenza B by PCR NEGATIVE NEGATIVE Final    Comment: (NOTE) The Xpert Xpress SARS-CoV-2/FLU/RSV plus assay is intended as an aid in the diagnosis of influenza from Nasopharyngeal swab specimens and should not be used as a sole basis for treatment. Nasal washings and aspirates are unacceptable for Xpert Xpress SARS-CoV-2/FLU/RSV testing.  Fact Sheet for Patients: BloggerCourse.com  Fact Sheet for Healthcare Providers: SeriousBroker.it  This test is not yet  approved or cleared by the [place] FDA and has been authorized for detection and/or diagnosis of SARS-CoV-2 by FDA under an Emergency Use Authorization (EUA). This EUA will remain in effect (meaning this test can be used) for the duration of the COVID-19 declaration under Section 564(b)(1) of the Act, 21 U.S.C. section 360bbb-3(b)(1), unless the authorization is terminated or revoked.     Resp Syncytial Virus by PCR POSITIVE (A) NEGATIVE Final    Comment: (NOTE) Fact Sheet for Patients: BloggerCourse.com  Fact Sheet for Healthcare  Providers: SeriousBroker.it  This test is not yet approved or cleared by the [place] and has been authorized for detection and/or diagnosis of SARS-CoV-2 by FDA under an Emergency Use Authorization (EUA). This EUA will remain in effect (meaning this test can be used) for the duration of the COVID-19 declaration under Section 564(b)(1) of the Act, 21 U.S.C. section 360bbb-3(b)(1), unless the authorization is terminated or revoked.  Performed at [facility], [address] [place], [place] [address]   Blood Culture (routine x 2)     Status: None   Collection Time: 07/04/23  3:41 PM   Specimen: BLOOD  Result Value Ref Range Status   Specimen Description BLOOD BLOOD RIGHT WRIST  Final   Special Requests   Final    BOTTLES DRAWN AEROBIC AND ANAEROBIC Blood Culture adequate volume   Culture   Final    NO GROWTH 5 DAYS Performed at [facility], [address] [place], [place] [address]    Report Status 07/09/2023 FINAL  Final  Blood Culture (routine x 2)     Status: None   Collection Time: 07/04/23  3:41 PM   Specimen: BLOOD  Result Value Ref Range Status   Specimen Description BLOOD BLOOD RIGHT HAND  Final   Special Requests   Final    BOTTLES DRAWN AEROBIC AND ANAEROBIC Blood Culture adequate volume   Culture   Final    NO GROWTH 5 DAYS Performed at [facility], [address], [place], [place] [address]    Report Status 07/09/2023 FINAL  Final  MRSA Next Gen by PCR, Nasal     Status: None   Collection Time: 07/04/23  6:20 PM   Specimen: Nasal Mucosa; Nasal Swab  Result Value Ref Range Status   MRSA by PCR Next Gen NOT DETECTED NOT DETECTED Final    Comment: (NOTE) The GeneXpert MRSA Assay (FDA approved for NASAL specimens only), is one component of a comprehensive MRSA colonization surveillance program. It is not intended to diagnose MRSA infection nor to guide or monitor treatment for MRSA infections. Test performance is not FDA approved in patients less than 38 years old. Performed at [facility], [address] [place], [place] [address]   Culture, Respiratory w Gram Stain     Status: None   Collection Time: 07/05/23  8:07 AM   Specimen: Tracheal Aspirate; Respiratory  Result Value Ref Range Status   Specimen Description   Final    TRACHEAL ASPIRATE Performed at [facility], [address], [place], [place] [address]    Special Requests   Final    NONE Performed at [facility], [address] [place], [place] [address]    Gram Stain   Final    MODERATE WBC PRESENT, PREDOMINANTLY PMN RARE GRAM POSITIVE COCCI IN PAIRS    Culture   Final    NO GROWTH 2 DAYS Performed at [facility], [address] [place], [place] [address]    Report Status 07/08/2023 FINAL  Final  Culture, Respiratory w Gram Stain     Status: None   Collection Time: 07/05/23  3:26 PM   Specimen: Bronchoalveolar Lavage; Respiratory  Result Value Ref Range Status   Specimen Description   Final    BRONCHIAL ALVEOLAR LAVAGE Performed at [facility], [address] [place], [place] [address]    Special Requests   Final    NONE Performed at [facility], [address] [place], [place] [address]    Gram Stain   Final    FEW WBC PRESENT, PREDOMINANTLY PMN NO ORGANISMS SEEN     Culture   Final    RARE Normal respiratory flora-no Staph aureus or Pseudomonas seen Performed at [facility], [address], [place], [place] [address]    Report Status 07/08/2023 FINAL  Final    Coagulation Studies: No results for input(s): "LABPROT", "INR" in the last 72 hours.  Urinalysis: No results for input(s): "COLORURINE", "LABSPEC", "PHURINE", "GLUCOSEU", "HGBUR", "BILIRUBINUR", "KETONESUR", "PROTEINUR", "UROBILINOGEN", "NITRITE", "LEUKOCYTESUR" in the last 72 hours.  Invalid input(s): "APPERANCEUR"    Imaging: No results found.   Medications:    dexmedetomidine (PRECEDEX) IV infusion Stopped (07/10/23 1007)   heparin 900 Units/hr (07/10/23 1100)   nitroGLYCERIN 70 mcg/min (07/10/23 1100)   piperacillin-tazobactam Stopped (07/10/23 0952)   promethazine (PHENERGAN) injection (IM or IVPB) Stopped (07/08/23 0339)    aspirin EC  81 mg Oral Daily   atorvastatin  80 mg Oral QHS   carvedilol  25 mg Oral BID WC   Chlorhexidine Gluconate Cloth  6 each Topical Daily   cloNIDine  0.2 mg Oral BID   ezetimibe  10 mg Oral Daily   feeding supplement (NEPRO CARB STEADY)  237 mL Oral BID   insulin aspart  0-6 Units Subcutaneous Q4H   ipratropium-albuterol  3 mL Nebulization QID   losartan  50 mg Oral Daily   multivitamin  1 tablet Oral QHS   pantoprazole (PROTONIX) IV  40 mg Intravenous Daily   predniSONE  30 mg Oral Q breakfast   Followed by   Melene Muller ON 07/13/2023] predniSONE  20 mg Oral Q breakfast   Followed by   Melene Muller ON 07/16/2023] predniSONE  10 mg Oral Q breakfast   sevelamer carbonate  0.8 g Oral TID WC   acetaminophen, alteplase, docusate sodium, heparin, hydrALAZINE, ipratropium-albuterol, labetalol, menthol-cetylpyridinium, mouth rinse, mouth rinse, polyethylene glycol, promethazine (PHENERGAN) injection (IM or IVPB)  Assessment/ Plan:  Ms. Eja Latouche is a 68 y.o.  female  with past medical history of diabetes mellitus type 2, peripheral  neuropathy, GERD, hypertension, aortic stenosis, anemia of chronic kidney disease, peripheral arterial disease, ESRD on HD MWF who presented with shortness of breath and found to have RSV infection.   [facility]/MWF/[facility] [address]/left upper extremity AV graft   1.  ESRD on HD MWF. Last dialysis 1/17. No indication for dialysis today. Plan for dialysis as per schedule Monday 1/18   2.  Acute respiratory failure secondary to RSV infection.  Extubated 07/07/2023.  Sat 96% on RA   3.  Anemia of chronic kidney disease.  Recent Labs       Lab Results  Component Value Date    HGB 7.1 (L) 07/09/2023      Consider starting Epogen next week.   4.  Secondary hyperparathyroidism.  Phosphorus within target range at 5.1.  Maintain the patient on Renvela 0.8 g 3 times daily.     LOS: 6 Cozy Veale Tonny Bollman 1/18/202512:08 PM

## 2023-07-10 NOTE — Progress Notes (Signed)
NAME:  Christy Curry, MRN:  [id number], DOB:  1955-07-31, LOS: 6 ADMISSION DATE:  07/04/2023 History of Present Illness:    Case of 68 year old female patient with a past medical history of peripheral artery disease, type 2 diabetes mellitus, diabetic neuropathy, GERD, hypertension, aortic stenosis, anemia of CKD, end-stage renal disease on HD via left AV fistula MWF currently on the kidney transplant list at [facility] who presented to [facility] on 01/12 with shortness of breath.   History taken mostly from chart review.   She presents from home with weakness, shortness of breath, cough for couple of days.  Initially on BiPAP with SpO2 of 100%.  Hypertensive and tachycardic.  Labs with leukocytosis neutrophilic predominant 21.6.  H&H stable.  Electrolytes unremarkable.  Creatinine 7.7 g/dL and BUN 28.  Troponin was noted to be elevated initially at 100 and subsequently at 795.  Over the course of the night her respiratory status got worse required intubation and mechanical ventilation.   Chest x-ray with multifocal pneumonia.  Chest x-ray postintubation with possible mucous plugging of the right upper lobe.  RSV positive.    Pertinent  Medical History  -- Hypertension on Amlodipine, Carvedilol, Clonidine, losartan -- Type II DM Glipizide -- HLD on atorvastatin and ezetimibe -- GERD on PPI  -- ESRD on HD, Sevelamer  Significant Hospital Events: Including procedures, antibiotic start and stop dates in addition to other pertinent events   07/05/2023 Intubation and MV, s/p bronchoscopy and airway clearance.  07/06/2023 Extubated but unfortunately had a multiple vomiting events through the day and night --> significant aspiration event requiring reintubation 07/06/2023 at night.  07/07/2023 Tolerated SBT well. This AM and overnight, she went into moderate respiratory distress. Requiring bipap support. Appears to be doing well on bipap. 07/08/2023 Patient with respiratory distress overnight,  hypertension and tachycardic. Unable to take PO meds. Started on Nitrodrip, IV metoprolol x1 and started on clonidine patch.  07/08/2022 Patient looking better today. Blood pressure improving. Remaining on Nitroglycerine.   Interim History / Subjective:  Awake alert and following commands. Appears to be doing well on bipap.   Objective   Blood pressure (!) 151/58, pulse 72, temperature 98.7 F (37.1 C), temperature source Oral, resp. rate 15, height 5\' 1"  (1.549 m), weight 61.4 kg, SpO2 96%.    FiO2 (%):  [40 %-50 %] 40 % PEEP:  [5 cmH20] 5 cmH20 Pressure Support:  [10 cmH20] 10 cmH20   Intake/Output Summary (Last 24 hours) at 07/10/2023 0918 Last data filed at 07/10/2023 0500 Gross per 24 hour  Intake 663.04 ml  Output 1500 ml  Net -836.96 ml   Filed Weights   07/07/23 0330 07/08/23 0345 07/09/23 0500  Weight: 60.9 kg 61.4 kg 61.4 kg    Examination: GEN On 2L [place], appears comfortable in no distress.   HEENT supple neck, reactive pupils CVS loud systolic murmur heard best at the right sternal border Lungs Clear bilateral air entry  Abdomen soft nontender nondistended positive bowel sounds Extremities warm well-perfused no edema   Labs and imaging were reviewed  Assessment & Plan:  Case of 68 year old female patient with a past medical history of peripheral artery disease, type 2 diabetes mellitus, diabetic neuropathy, GERD, hypertension, aortic stenosis, anemia of CKD, end-stage renal disease on HD via left AV fistula MWF currently on the kidney transplant list at [facility] who presented to [facility] on 01/12 with shortness of breath.  Found to be in acute hypoxic respiratory failure requiring intubation and mechanical ventilation  secondary to RSV pneumonia possible superimposed bacterial pneumonia and mucous plugging.   #Acute hypoxic respiratory failure requiring intubation and mechanical ventilation 07/05/2023 secondary to.... #RSV pneumonia with possible superimposed bacterial  pneumonia and mucous plugging s/p extubation 07/05/2022 with course complicated by aspiration event requring re-intubation 07/05/2022 at night and now re-extubated 07/06/2022.  #Type II NSTEMI secondary to demand #History of end-stage renal disease on HD MWF via left AV fistula #Severe aortic stenosis with AV area surface 0.68 cm. MG 47 mmHg.  #Hypertension, hyperlipidemia and type 2 diabetes mellitus   She is improving from a respiratory standpoint however she is still having these episodes of respiratory distress I think majorly driven by her severe aortic stenosis with area 0.6 cm2 and MG 57. Discussed with [facility] overnight and pending call back.   Neuro: Precedex.  CVS: Goal MAP > = 65. D/cclonidine patch, Start home clonidine 0.2 BID and half home losartan 50. C/w home carvedilol. Wean off Nitroglycerin drip for SBP goal 140. Appreciate cards recs.  Lungs: MRSA swab -, RSV +, Trach aspirate remains negative. Completed Azithromycin. C/w zosyn and complete a 5 days course given inhospital aspiration event. Decrease Prednisone to 30mg  and taper by 10mg  every 3 days.  GI: Clear liquid diet. Advance as tolerated. PPI daily. Lax as needed.   Renal: Renal onboard. HD MWF c/w home sevelamer.  Heme: Heparin drip  Endo: ISS POC goal 140-180   Best Practice (right click and "Reselect all SmartList Selections" daily)   Diet/type: Dysph 2 diet with 2L Fluid restriction DVT prophylaxis systemic heparin Pressure ulcer(s): N/A GI prophylaxis: PPI Lines: Dialysis Catheter Foley:  N/A Code Status:  full code  Last date of multidisciplinary goals of care discussion [07/10/2023]  I spent 40 minutes caring for this patient today, including preparing to see the patient, obtaining a medical history , reviewing a separately obtained history, performing a medically appropriate examination and/or evaluation, ordering medications, tests, or procedures, documenting clinical information in the electronic health  record, and independently interpreting results (not separately reported/billed) and communicating results to the patient/family/caregiver  Janann Colonel, MD [facility] 07/10/2023 9:18 AM

## 2023-07-10 NOTE — Progress Notes (Signed)
Pt complaining of left sided chest pressure, SOB, and headache. EKG obtained. Vital signs stable. Scheduled neb administered w/ improvement of SOB. Provider notified and in to evaluate pt. Order received for 1mg  IV Morphine x1 and PRN doses Q4hr (see orders). Pt reports that chest pressure and headache have improved since administration of Morphine. Vital signs remain stable. Pt's son at bedside.

## 2023-07-10 NOTE — Progress Notes (Signed)
Progress Note  Patient Name: Christy Curry Date of Encounter: 07/10/2023  Primary Cardiologist: Mariah Milling  Subjective   Remains on BiPAP.  No acute overnight cardiac events.  Hemodynamically stable.  Hemoglobin trending down from 7.8-7.1.  Awaiting callback from [facility] for possible transfer.  Inpatient Medications    Scheduled Meds:  aspirin EC  81 mg Oral Daily   atorvastatin  80 mg Oral QHS   carvedilol  25 mg Oral BID WC   Chlorhexidine Gluconate Cloth  6 each Topical Daily   cloNIDine  0.2 mg Oral BID   ezetimibe  10 mg Oral Daily   feeding supplement (NEPRO CARB STEADY)  237 mL Oral BID   insulin aspart  0-6 Units Subcutaneous Q4H   ipratropium-albuterol  3 mL Nebulization QID   losartan  50 mg Oral Daily   multivitamin  1 tablet Oral QHS   pantoprazole (PROTONIX) IV  40 mg Intravenous Daily   predniSONE  30 mg Oral Q breakfast   Followed by   Melene Muller ON 07/13/2023] predniSONE  20 mg Oral Q breakfast   Followed by   Melene Muller ON 07/16/2023] predniSONE  10 mg Oral Q breakfast   sevelamer carbonate  0.8 g Oral TID WC   Continuous Infusions:  dexmedetomidine (PRECEDEX) IV infusion 0.2 mcg/kg/hr (07/10/23 1000)   heparin 900 Units/hr (07/10/23 1000)   nitroGLYCERIN 65 mcg/min (07/10/23 1000)   piperacillin-tazobactam Stopped (07/10/23 0952)   promethazine (PHENERGAN) injection (IM or IVPB) Stopped (07/08/23 0339)   PRN Meds: acetaminophen, alteplase, docusate sodium, heparin, hydrALAZINE, ipratropium-albuterol, labetalol, menthol-cetylpyridinium, mouth rinse, polyethylene glycol, promethazine (PHENERGAN) injection (IM or IVPB)   Vital Signs    Vitals:   07/10/23 1020 07/10/23 1030 07/10/23 1045 07/10/23 1100  BP: (!) 150/64 (!) 157/77  (!) 153/61  Pulse: 78 78 80 83  Resp: (!) 24 14 (!) 21 (!) 24  Temp:      TempSrc:      SpO2: 97% 97% 97% 99%  Weight:      Height:        Intake/Output Summary (Last 24 hours) at 07/10/2023 1111 Last data filed at 07/10/2023  1000 Gross per 24 hour  Intake 1056.98 ml  Output 1500 ml  Net -443.02 ml   Filed Weights   07/07/23 0330 07/08/23 0345 07/09/23 0500  Weight: 60.9 kg 61.4 kg 61.4 kg    Telemetry    Sinus rhythm - Personally Reviewed  ECG    No new tracings - Personally Reviewed  Physical Exam   GEN: Ill-appearing, no acute distress.   Neck: JVD difficult to assess secondary to respiratory support apparatus. Cardiac: RRR, II/VI systolic murmur RUSB, no rubs, or gallops.  Respiratory: Diminished coarse breath sounds bilaterally.  GI: Soft, nontender, non-distended.   MS: No edema; No deformity. Neuro: Somnolent; Nonfocal.  Psych: Normal affect, responds to questions appropriately.  Labs    Chemistry Recent Labs  Lab 07/04/23 1348 07/05/23 0403 07/07/23 0404 07/08/23 0502 07/08/23 1112 07/09/23 0524 07/10/23 0502  NA 140   < > 136 134*  --  134* 133*  K 3.8   < > 4.1 4.3  --  4.2 3.7  CL 106   < > 99 97*  --  95* 95*  CO2 19*   < > 26 24  --  21* 25  GLUCOSE 171*   < > 134* 139*  --  143* 148*  BUN 28*   < > 36* 27*  --  24* 29*  CREATININE  7.07*   < > 5.86* 4.01*  --  3.79* 3.67*  CALCIUM 9.1   < > 8.0* 8.8*  --  8.6* 9.0  PROT 7.2  --  5.5*  --  5.8*  --   --   ALBUMIN 3.9   < > 2.6*  2.6* 3.0* 2.8* 2.8* 2.6*  AST 23  --  15  --  27  --   --   ALT 13  --  11  --  12  --   --   ALKPHOS 81  --  53  --  50  --   --   BILITOT 0.7  --  0.6  --  0.8  --   --   GFRNONAA 6*   < > 7* 12*  --  12* 13*  ANIONGAP 15   < > 11 13  --  18* 13   < > = values in this interval not displayed.     Hematology Recent Labs  Lab 07/08/23 0502 07/09/23 0524 07/10/23 0502  WBC 12.3* 6.3 4.9  RBC 2.92* 2.57* 2.30*  HGB 8.9* 7.8* 7.1*  HCT 29.0* 24.6* 21.7*  MCV 99.3 95.7 94.3  MCH 30.5 30.4 30.9  MCHC 30.7 31.7 32.7  RDW 14.6 14.4 14.2  PLT 275 202 179    Cardiac EnzymesNo results for input(s): "TROPONINI" in the last 168 hours. No results for input(s): "TROPIPOC" in the last 168  hours.   BNPNo results for input(s): "BNP", "PROBNP" in the last 168 hours.   DDimer No results for input(s): "DDIMER" in the last 168 hours.   Radiology    DG Abd 1 View Result Date: 07/08/2023 IMPRESSION: Nonobstructive bowel gas pattern. Electronically Signed   By: Elgie Collard M.D.   On: 07/08/2023 11:01   DG Chest Port 1 View Result Date: 07/08/2023 IMPRESSION: 1. Support apparatus, as above. 2. Severe but improving multilobar bilateral pneumonia, as above. 3. Small bilateral pleural effusions. 4. Aortic atherosclerosis. Electronically Signed   By: Trudie Reed M.D.   On: 07/08/2023 05:20    Cardiac Studies   2D echo 07/06/2023: 1. Left ventricular ejection fraction, by estimation, is 50 to 55%. The  left ventricle has low normal function. Left ventricular endocardial  border not optimally defined to evaluate regional wall motion. There is  mild left ventricular hypertrophy. Left  ventricular diastolic parameters are consistent with Grade II diastolic  dysfunction (pseudonormalization). Elevated left atrial pressure.   2. Right ventricular systolic function is normal. The right ventricular  size is normal. There is normal pulmonary artery systolic pressure.   3. The mitral valve is degenerative. Mild to moderate mitral valve  regurgitation. Moderate mitral annular calcification.   4. Tricuspid valve regurgitation is mild to moderate.   5. The aortic valve has an indeterminant number of cusps. There is severe  calcifcation of the aortic valve. There is moderate thickening of the  aortic valve. Aortic valve regurgitation is trivial. Severe aortic valve  stenosis. Aortic valve area, by VTI  measures 0.70 cm. Aortic valve mean gradient measures 47.0 mmHg.   6. The inferior vena cava is normal in size with <50% respiratory  variability, suggesting right atrial pressure of 8 mmHg.  Patient Profile     68 y.o. 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   Assessment & Plan    1. Acute hypoxic respiratory failure: -She was admitted on 07/04/2023 with acute hypoxic respiratory failure requiring mechanical ventilation in the setting of RSV pneumonia with possible superimposed bacterial pneumonia and mucous plugging s/p extubation on 07/05/2022 with course complicated by aspiration pneumonia requiring reintubation on 07/05/2022, now extubated on 07/07/2023 -Management per CCM   2. Severe aortic stenosis: -Echo this admission with severe aortic stenosis with a mean gradient of 47 mmHg and a valve area 0.71 cm.  This is progression from echo obtained at [facility] in 02/2023 at which time she had a mean gradient of 29 mmHg and a valve area of 1.0 cm.  It is unclear if some of this change is in the setting of her acute illness -Do not suspect that this is the primary driving force of her respiratory failure, though it is certainly contributory -She will require further evaluation with [facility] as outlined below once she is improved from her acute pulmonary illness -Primary service arranging possible transfer to [facility]   3.  CAD with NSTEMI: -No chest pain -Initially noted to have mildly elevated high-sensitivity troponin felt to be reflective of  supply/demand ischemia secondary to severe acute pulmonary illness, severe aortic stenosis, anemia of chronic disease, and ESRD.  On 1/16 if she had further elevation of troponin peaking at 5054.  Patient continued to deny symptoms of chest pain at that time.  It is unclear if this is reflective of ongoing supply/demand ischemia in the setting of her acute pulmonary illness requiring intubation with subsequent reintubation versus acute plaque rupture with NSTEMI.  Fortunately, she continues to remain chest pain-free -If MD agrees, would recommend discontinuing heparin drip with downtrending hemoglobin -Echo with low normal LV systolic function -She prefers to pursue cardiac cath as a scheduled with [facility] next month, given that she is without symptoms of angina, and continues to require BiPAP in the setting of her acute pulmonary illness there is no emergent indication for cardiac cath at this time.  However, if she becomes unstable this would need to be revisited -Agree with primary service in discussions with possible transfer to [facility] -Continue aspirin, atorvastatin, ezetimibe, and carvedilol -Would recommend weaning off nitro drip as able      For questions or updates, please contact [facility] Please consult [email or web address] for contact info under Cardiology/STEMI.    Signed, Eula Listen, PA-C [facility] Pager: [phone] 07/10/2023, 11:11 AM

## 2023-07-10 NOTE — Plan of Care (Addendum)
Problem: Education: Goal: Ability to describe self-care measures that may prevent or decrease complications (Diabetes Survival Skills Education) will improve 07/10/2023 0334 by Cameron Ali, RN Outcome: Progressing   Problem: Coping: Goal: Ability to adjust to condition or change in health will improve 07/10/2023 0334 by Cameron Ali, RN Outcome: Progressing  Problem: Fluid Volume: Goal: Ability to maintain a balanced intake and output will improve 07/10/2023 0334 by Cameron Ali, RN Outcome: Progressing   Problem: Health Behavior/Discharge Planning: Goal: Ability to identify and utilize available resources and services will improve 07/10/2023 0334 by Cameron Ali, RN Outcome: Progressing Goal: Ability to manage health-related needs will improve 07/10/2023 0334 by Cameron Ali, RN Outcome: Progressing 07/10/2023 0334 by Cameron Ali, RN Outcome: Not Progressing   Problem: Metabolic: Goal: Ability to maintain appropriate glucose levels will improve 07/10/2023 0334 by Cameron Ali, RN Outcome: Progressing   Problem: Nutritional: Goal: Maintenance of adequate nutrition will improve 07/10/2023 0334 by Cameron Ali, RN Outcome: Progressing Goal: Progress toward achieving an optimal weight will improve 07/10/2023 0334 by Cameron Ali, RN Outcome: Progressing   Problem: Skin Integrity: Goal: Risk for impaired skin integrity will decrease 07/10/2023 0334 by Cameron Ali, RN Outcome: Progressing   Problem: Tissue Perfusion: Goal: Adequacy of tissue perfusion will improve 07/10/2023 0334 by Cameron Ali, RN Outcome: Progressing   Problem: Fluid Volume: Goal: Hemodynamic stability will improve 07/10/2023 0334 by Cameron Ali, RN Outcome: Progressing   Problem: Clinical Measurements: Goal: Diagnostic test results will improve 07/10/2023 0334 by Cameron Ali, RN Outcome:  Progressing Goal: Signs and symptoms of infection will decrease 07/10/2023 0334 by Cameron Ali, RN Outcome: Progressing   Problem: Respiratory: Goal: Ability to maintain adequate ventilation will improve 07/10/2023 0334 by Cameron Ali, RN Outcome: Progressing   Problem: Education: Goal: Knowledge of General Education information will improve Description: Including pain rating scale, medication(s)/side effects and non-pharmacologic comfort measures 07/10/2023 0334 by Cameron Ali, RN Outcome: Progressing   Problem: Health Behavior/Discharge Planning: Goal: Ability to manage health-related needs will improve 07/10/2023 0334 by Cameron Ali, RN Outcome: Progressing   Problem: Clinical Measurements: Goal: Ability to maintain clinical measurements within normal limits will improve 07/10/2023 0334 by Cameron Ali, RN Outcome: Progressing Goal: Will remain free from infection 07/10/2023 0334 by Cameron Ali, RN Outcome: Progressing Goal: Diagnostic test results will improve 07/10/2023 0334 by Cameron Ali, RN Outcome: Progressing Goal: Respiratory complications will improve 07/10/2023 0334 by Cameron Ali, RN Outcome: Progressing Goal: Cardiovascular complication will be avoided 07/10/2023 0334 by Cameron Ali, RN Outcome: Progressing   Problem: Activity: Goal: Risk for activity intolerance will decrease 07/10/2023 0334 by Cameron Ali, RN Outcome: Progressing   Problem: Nutrition: Goal: Adequate nutrition will be maintained 07/10/2023 0334 by Cameron Ali, RN Outcome: Progressing   Problem: Coping: Goal: Level of anxiety will decrease 07/10/2023 0334 by Cameron Ali, RN Outcome: Progressing   Problem: Elimination: Goal: Will not experience complications related to bowel motility 07/10/2023 0334 by Cameron Ali, RN Outcome: Progressing Goal: Will not experience complications  related to urinary retention 07/10/2023 0334 by Cameron Ali, RN Outcome: Progressing   Problem: Pain Management: Goal: General experience of comfort will improve 07/10/2023 0334 by Cameron Ali, RN Outcome: Progressing   Problem: Safety: Goal: Ability to remain free from injury will improve 07/10/2023 0334 by Cameron Ali, RN Outcome: Progressing  Problem: Skin Integrity: Goal: Risk  for impaired skin integrity will decrease 07/10/2023 0334 by Cameron Ali, RN Outcome: Progressing   Problem: Activity: Goal: Ability to tolerate increased activity will improve 07/10/2023 0334 by Cameron Ali, RN Outcome: Progressing   Problem: Respiratory: Goal: Ability to maintain a clear airway and adequate ventilation will improve 07/10/2023 0334 by Cameron Ali, RN Outcome: Progressing   Problem: Role Relationship: Goal: Method of communication will improve 07/10/2023 0334 by Cameron Ali, RN Outcome: Progressing

## 2023-07-10 NOTE — Progress Notes (Addendum)
Ms. Christy Curry is a 68 year old woman with PMHx of CAD, moderate-severe aortic stenosis (mean gradient 47 mmHg, valve area 0.7 cm, dimensionless index 0.27), ESRD on HD and currently being evaluated for kidney transplant at [facility]. Patient was admitted to ICU on 07/04/23 with RSV pneumonia requiring mechanical ventilatory support. Echo this admission with severe aortic stenosis with a mean gradient of 47 mmHg and a valve area 0.71 cm.  This is progression from echo obtained at [facility] in 02/2023 at which time she had a mean gradient of 29 mmHg and a valve area of 1.0 cm.  She was noted with elevated troponin peaking at 5054 suspected ongoing supply/demand ischemia in the setting of her acute pulmonary illness or an acute plaque rupture event requiring intubation with subsequent reintubation versus NSTEMI. Cardiology was consulted who felt she was too high risk for [facility] due to her acute pulmonary illness and possibly requiring surgical intervention. she is scheduled for LHC at [facility] by Dr. Jacquelyne Balint on 07/29/2023. I reached out to [facility] Cardiology fellow who agrees that she should be transferred to higher level of care for evaluation of severe aortic stenosis. [facility] fellow will discuss with his attending in the am and decide if they will accept her to their service  for further evaluation. Pending call back from [facility] cardiology service.   Webb Silversmith, DNP, CCRN, FNP-C, AGACNP-BC Acute Care & Family Nurse Practitioner  [facility]  See Amion for personal pager PCCM on call pager [phone] until 7 am

## 2023-07-10 NOTE — Progress Notes (Signed)
Pt noted to be coughing. This nurse in to assess pt. Pt reports that she felt as if she had a dry throat and took a drink, then noted to start coughing. Vital signs stable. Repositioned patient, all drinks removed from bedside table. Provider notified. Advised pt that plan will be NPO at this time pending SLP eval. Pt voiced understanding. Offered pt mouth swab, pt declined.

## 2023-07-10 NOTE — Progress Notes (Signed)
ANTICOAGULATION CONSULT NOTE  Pharmacy Consult for heparin infusion Indication: VTE Tx  Allergies  Allergen Reactions   2,4-D Dimethylamine Swelling   Other Swelling   Tetanus Antitoxin Itching   Tetanus Toxoid    Tetanus Toxoids Itching    Patient Measurements: Height: 5\' 1"  (154.9 cm) Weight: 61.4 kg (135 lb 5.8 oz) IBW/kg (Calculated) : 47.8 Heparin Dosing Weight: 60.8 kg  Vital Signs: Temp: 98.1 F (36.7 C) (01/18 0320) Temp Source: Oral (01/18 0320) BP: 150/64 (01/18 0545) Pulse Rate: 66 (01/18 0545)  Labs: Recent Labs    07/08/23 0502 07/08/23 1112 07/08/23 1337 07/08/23 1752 07/08/23 1947 07/09/23 0524 07/10/23 0502  HGB 8.9*  --   --   --   --  7.8* 7.1*  HCT 29.0*  --   --   --   --  24.6* 21.7*  PLT 275  --   --   --   --  202 179  HEPARINUNFRC 0.51  --   --   --   --  0.46 0.50  CREATININE 4.01*  --   --   --   --  3.79* 3.67*  TROPONINIHS  --    < > 2,798* 5,054* 3,079*  --   --    < > = values in this interval not displayed.    Estimated Creatinine Clearance: 12.3 mL/min (A) (by C-G formula based on SCr of 3.67 mg/dL (H)).   Medical History: Past Medical History:  Diagnosis Date   Anemia of chronic disease    Aortic stenosis    Diabetes mellitus with complication (HCC)    Diabetic angiopathy (HCC)    Diabetic retinopathy (HCC)    ESRD (end stage renal disease) (HCC)    HLD (hyperlipidemia)    Hypertension    RBBB     Assessment: Pt is a 68 yo female presenting to ED c/o SOB, found with "acute symptomatic nonocclusive RUExt DVT."  Goal of Therapy:  Heparin level 0.3-0.7 units/ml Monitor platelets by anticoagulation protocol: Yes  Date/Time: HL: Comment/Rate: 1/14@0051  0.92 SUPRAtherapeutic@1100  units/hr 1/14@0846  0.76 SUPRAtherapeutic@1000  units/hr 1/14@2026  0.51 1/15@0404      0.45     Therapeutic X 2  1/16@0502      0.51     Therapeutic X 3  1/17@0524      0.46     Therapeutic X 4  1/18@0502      0.50     Therapeutic X 5     Plan:  1/18:  HL @ 0502 = 0.50, therapeutic X 5  - Will continue pt on current rate and recheck HL on 1/19 with AM labs.  Scherrie Gerlach, PharmD Clinical Pharmacist 07/10/2023 5:58 AM

## 2023-07-10 NOTE — Evaluation (Signed)
Clinical/Bedside Swallow Evaluation Patient Details  Name: Christy Curry MRN: [id number] Date of Birth: 1955-12-16  Today's Date: 07/10/2023 Time: SLP Start Time (ACUTE ONLY): 0845 SLP Stop Time (ACUTE ONLY): 0915 SLP Time Calculation (min) (ACUTE ONLY): 30 min  Past Medical History:  Past Medical History:  Diagnosis Date   Anemia of chronic disease    Aortic stenosis    Diabetes mellitus with complication (HCC)    Diabetic angiopathy (HCC)    Diabetic retinopathy (HCC)    ESRD (end stage renal disease) (HCC)    HLD (hyperlipidemia)    Hypertension    RBBB    Past Surgical History:  Past Surgical History:  Procedure Laterality Date   av fistula Left    HPI:  68 year old female patient with a past medical history of peripheral artery disease, type 2 diabetes mellitus, diabetic neuropathy, GERD, hypertension, aortic stenosis, anemia of CKD, end-stage renal disease on HD via left AV fistula MWF currently on the kidney transplant list at [facility] who presented to [facility] on 01/12 with shortness of breath. Initially on BiPAP with SpO2 of 100%. Over the course of the night her respiratory status got worse required intubation and mechanical ventilation (1/13) with bronchoscopy and airway clearance. She was extubated on 07/05/2022, though had multiple vomiting events throughout the day and night with significant aspiration event requiring reintubation on 07/05/2022. She was extubated 1/15. BiPAP utilized 1/15-1/17 preventing bedside swallow evaluation completion. Pt now on 2L O2. Pt NPO following report of choking with liquids per nursing report overnight awaiting swallow evaluation. CXR 1/16: Severe but improving multilobar bilateral pneumonia, as above. Small bilateral pleural effusions. Aortic atherosclerosis.    Assessment / Plan / Recommendation  Clinical Impression  Pt seen for bedside swallow evaluation s/p extubation on 1/15 and with concern for aspiration. Pt on 2L O2 nasal canula  with O2 saturations maintained at 95-97 for duration of trials. Pt with clear strong voice and cough. Pt denied soreness or irritation in throat. Trials completed of thin liquids (spoon, cup, straw), puree, mech soft solids (softened graham cracker), and mixed consistency (pills with thin liquid). No overt or subtle s/sx pharyngeal dysphagia noted. No change to vocal quality across trials. Vitals stable for duration of trials. Oral phase impacted by minimal dentition and lack of dentures in room. Liquid wash and extended time for oral clearance provided. Pt with notably reduced endurance for PO trials requesting rest and lowering head of bed following completion of assessment.       Based on current deconditioning, pulmonary/respiratory status, and endurance pt is at increased risk for aspiration. Therefore, recommend strict aspiration precautions including slow rate, small bites, elevated HOB, and alert for PO intake. Pt likely to benefit from small meals/snacks in the setting of reduced endurance for meals. Recommend Dys 2 (chopped) and thin liquids. MD and RN aware of recommendations. SLP will follow up for education for aspiration precautions and to ensure safety with current/advanced diet recommendations. SLP Visit Diagnosis: Dysphagia, unspecified (R13.10)    Aspiration Risk  Moderate aspiration risk    Diet Recommendation   Thin;Dysphagia 2 (chopped)  Medication Administration: Whole meds with liquid    Other  Recommendations Oral Care Recommendations: Oral care BID    Recommendations for follow up therapy are one component of a multi-disciplinary discharge planning process, led by the attending physician.  Recommendations may be updated based on patient status, additional functional criteria and insurance authorization.  Follow up Recommendations No SLP follow up (none expected following discharge)  Assistance Recommended at Discharge    Functional Status Assessment Patient has had a  recent decline in their functional status and demonstrates the ability to make significant improvements in function in a reasonable and predictable amount of time.  Frequency and Duration min 2x/week  2 weeks       Prognosis Prognosis for improved oropharyngeal function: Good Barriers to Reach Goals: Other (Comment) (endurance)      Swallow Study   General Date of Onset: 07/10/23 HPI: 68 year old female patient with a past medical history of peripheral artery disease, type 2 diabetes mellitus, diabetic neuropathy, GERD, hypertension, aortic stenosis, anemia of CKD, end-stage renal disease on HD via left AV fistula MWF currently on the kidney transplant list at [facility] who presented to [facility] on 01/12 with shortness of breath. Initially on BiPAP with SpO2 of 100%. Over the course of the night her respiratory status got worse required intubation and mechanical ventilation (1/13) with bronchoscopy and airway clearance. She was extubated on 07/05/2022, though had multiple vomiting events throughout the day and night with significant aspiration event requiring reintubation on 07/05/2022. She was extubated 1/15. BiPAP utilized 1/15-1/17 preventing bedside swallow evaluation completion. Pt now on 2L O2. Pt NPO following report of choking with liquids per nursing report overnight awaiting swallow evaluation. CXR 1/16: Severe but improving multilobar bilateral pneumonia, as above. Small bilateral pleural effusions. Aortic atherosclerosis. Type of Study: Bedside Swallow Evaluation Previous Swallow Assessment: none in chart Diet Prior to this Study: NPO Temperature Spikes Noted: No (Temp 99.9; WBC 4.9) Respiratory Status: Nasal cannula (2L) History of Recent Intubation: Yes Total duration of intubation (days): 3 days Date extubated: 07/07/23 Behavior/Cognition: Alert;Cooperative Oral Cavity Assessment: Within Functional Limits Oral Care Completed by SLP: Yes Oral Cavity - Dentition: Missing dentition;Other  (Comment) (minimal bottom dentition; no top (dentures not present in room)) Vision: Functional for self-feeding Self-Feeding Abilities: Needs assist Patient Positioning: Upright in bed Baseline Vocal Quality: Normal Volitional Cough: Strong Volitional Swallow: Able to elicit    Oral/Motor/Sensory Function Overall Oral Motor/Sensory Function: Within functional limits   Ice Chips Ice chips: Within functional limits   Thin Liquid Thin Liquid: Within functional limits    Nectar Thick Nectar Thick Liquid: Not tested   Honey Thick Honey Thick Liquid: Not tested   Puree Puree: Within functional limits   Solid     Solid: Impaired Presentation: Spoon Oral Phase Functional Implications: Impaired mastication;Prolonged oral transit Pharyngeal Phase Impairments:  (none) Other Comments: soft solids consistent     [place] Bernd Crom Clapp MS [facility] SLP   [place] J Clapp 07/10/2023,10:23 AM

## 2023-07-11 DIAGNOSIS — R918 Other nonspecific abnormal finding of lung field: Secondary | ICD-10-CM | POA: Diagnosis not present

## 2023-07-11 DIAGNOSIS — I480 Paroxysmal atrial fibrillation: Secondary | ICD-10-CM | POA: Diagnosis not present

## 2023-07-11 DIAGNOSIS — R7989 Other specified abnormal findings of blood chemistry: Secondary | ICD-10-CM | POA: Diagnosis not present

## 2023-07-11 DIAGNOSIS — I2510 Atherosclerotic heart disease of native coronary artery without angina pectoris: Secondary | ICD-10-CM | POA: Diagnosis not present

## 2023-07-11 DIAGNOSIS — I350 Nonrheumatic aortic (valve) stenosis: Secondary | ICD-10-CM | POA: Diagnosis not present

## 2023-07-11 LAB — CBC
HCT: 23.2 % — ABNORMAL LOW (ref 36.0–46.0)
Hemoglobin: 7.5 g/dL — ABNORMAL LOW (ref 12.0–15.0)
MCH: 31 pg (ref 26.0–34.0)
MCHC: 32.3 g/dL (ref 30.0–36.0)
MCV: 95.9 fL (ref 80.0–100.0)
Platelets: 175 10*3/uL (ref 150–400)
RBC: 2.42 MIL/uL — ABNORMAL LOW (ref 3.87–5.11)
RDW: 14.3 % (ref 11.5–15.5)
WBC: 6.3 10*3/uL (ref 4.0–10.5)
nRBC: 0 % (ref 0.0–0.2)

## 2023-07-11 LAB — GLUCOSE, CAPILLARY
Glucose-Capillary: 140 mg/dL — ABNORMAL HIGH (ref 70–99)
Glucose-Capillary: 130 mg/dL — ABNORMAL HIGH (ref 70–99)
Glucose-Capillary: 183 mg/dL — ABNORMAL HIGH (ref 70–99)
Glucose-Capillary: 189 mg/dL — ABNORMAL HIGH (ref 70–99)
Glucose-Capillary: 191 mg/dL — ABNORMAL HIGH (ref 70–99)
Glucose-Capillary: 154 mg/dL — ABNORMAL HIGH (ref 70–99)

## 2023-07-11 LAB — RENAL FUNCTION PANEL
Albumin: 2.4 g/dL — ABNORMAL LOW (ref 3.5–5.0)
Anion gap: 13 (ref 5–15)
BUN: 46 mg/dL — ABNORMAL HIGH (ref 8–23)
CO2: 24 mmol/L (ref 22–32)
Calcium: 8.5 mg/dL — ABNORMAL LOW (ref 8.9–10.3)
Chloride: 94 mmol/L — ABNORMAL LOW (ref 98–111)
Creatinine, Ser: 5.3 mg/dL — ABNORMAL HIGH (ref 0.44–1.00)
GFR, Estimated: 8 mL/min — ABNORMAL LOW (ref >60)
Glucose, Bld: 151 mg/dL — ABNORMAL HIGH (ref 70–99)
Phosphorus: 4.6 mg/dL (ref 2.5–4.6)
Potassium: 3.4 mmol/L — ABNORMAL LOW (ref 3.5–5.1)
Sodium: 131 mmol/L — ABNORMAL LOW (ref 135–145)

## 2023-07-11 LAB — MAGNESIUM: Magnesium: 2.3 mg/dL (ref 1.7–2.4)

## 2023-07-11 MED ORDER — TRAZODONE HCL 50 MG PO TABS
50.0000 mg | ORAL_TABLET | Freq: Every evening | ORAL | Status: DC | PRN
  Administered 2023-07-11: 50 mg via ORAL
  Filled 2023-07-11: qty 1

## 2023-07-11 MED ORDER — NEPRO/CARBSTEADY PO LIQD: 237.0000 mL | Freq: Two times a day (BID) | ORAL | Status: AC

## 2023-07-11 MED ORDER — ONDANSETRON HCL 4 MG/2ML IJ SOLN: 4.0000 mg | Freq: Four times a day (QID) | INTRAMUSCULAR | Status: AC | PRN

## 2023-07-11 MED ORDER — PIPERACILLIN-TAZOBACTAM IN DEX 2-0.25 GM/50ML IV SOLN: 2.2500 g | Freq: Three times a day (TID) | INTRAVENOUS | Status: DC

## 2023-07-11 MED ORDER — HEPARIN SODIUM (PORCINE) 5000 UNIT/ML IJ SOLN: 5000.0000 [IU] | Freq: Three times a day (TID) | INTRAMUSCULAR | Status: DC

## 2023-07-11 MED ORDER — MENTHOL 3 MG MT LOZG: 1.0000 | LOZENGE | OROMUCOSAL | Status: AC | PRN

## 2023-07-11 MED ORDER — HYDRALAZINE HCL 20 MG/ML IJ SOLN: 10.0000 mg | INTRAMUSCULAR | Status: DC | PRN

## 2023-07-11 MED ORDER — ONDANSETRON HCL 4 MG/2ML IJ SOLN
4.0000 mg | Freq: Three times a day (TID) | INTRAMUSCULAR | Status: DC | PRN
Start: 2023-07-11 — End: 2023-07-11

## 2023-07-11 MED ORDER — ASPIRIN 81 MG PO TBEC: 81.0000 mg | DELAYED_RELEASE_TABLET | Freq: Every day | ORAL | Status: DC

## 2023-07-11 MED ORDER — BISACODYL 10 MG RE SUPP: 10.0000 mg | Freq: Every day | RECTAL | Status: DC | PRN

## 2023-07-11 MED ORDER — ACETAMINOPHEN 325 MG PO TABS: 650.0000 mg | ORAL_TABLET | Freq: Four times a day (QID) | ORAL | Status: AC | PRN

## 2023-07-11 MED ORDER — SEVELAMER CARBONATE 0.8 G PO PACK: 0.8000 g | PACK | Freq: Three times a day (TID) | ORAL | Status: AC

## 2023-07-11 MED ORDER — DOCUSATE SODIUM 100 MG PO CAPS: 100.0000 mg | ORAL_CAPSULE | Freq: Two times a day (BID) | ORAL | Status: DC | PRN

## 2023-07-11 MED ORDER — RENA-VITE PO TABS: 1.0000 | ORAL_TABLET | Freq: Every day | ORAL | Status: AC

## 2023-07-11 MED ORDER — ONDANSETRON HCL 4 MG/2ML IJ SOLN
4.0000 mg | Freq: Four times a day (QID) | INTRAMUSCULAR | Status: DC | PRN
  Administered 2023-07-11 – 2023-07-12 (×3): 4 mg via INTRAVENOUS
  Filled 2023-07-11 (×2): qty 2

## 2023-07-11 MED ORDER — INSULIN ASPART 100 UNIT/ML IJ SOLN: 0.0000 [IU] | INTRAMUSCULAR | Status: DC

## 2023-07-11 MED ORDER — IPRATROPIUM-ALBUTEROL 0.5-2.5 (3) MG/3ML IN SOLN: 3.0000 mL | Freq: Four times a day (QID) | RESPIRATORY_TRACT | Status: AC | PRN

## 2023-07-11 MED ORDER — PREDNISONE 10 MG PO TABS: ORAL_TABLET | ORAL | Status: AC

## 2023-07-11 MED ORDER — HEPARIN SODIUM (PORCINE) 5000 UNIT/ML IJ SOLN
5000.0000 [IU] | Freq: Three times a day (TID) | INTRAMUSCULAR | Status: DC
  Administered 2023-07-11 – 2023-07-12 (×4): 5000 [IU] via SUBCUTANEOUS
  Filled 2023-07-11 (×4): qty 1

## 2023-07-11 MED ORDER — ATORVASTATIN CALCIUM 80 MG PO TABS: 80.0000 mg | ORAL_TABLET | Freq: Every day | ORAL | Status: AC

## 2023-07-11 MED ORDER — CHLORHEXIDINE GLUCONATE CLOTH 2 % EX PADS
6.0000 | MEDICATED_PAD | Freq: Every day | CUTANEOUS | Status: DC
  Administered 2023-07-12: 6 via TOPICAL

## 2023-07-11 MED ORDER — POLYETHYLENE GLYCOL 3350 17 G PO PACK: 17.0000 g | PACK | Freq: Every day | ORAL | Status: AC | PRN

## 2023-07-11 MED ORDER — ONDANSETRON HCL 4 MG/2ML IJ SOLN
INTRAMUSCULAR | Status: AC
  Filled 2023-07-11: qty 2

## 2023-07-11 MED ORDER — FLEET ENEMA RE ENEM
1.0000 | ENEMA | Freq: Once | RECTAL | Status: AC
  Administered 2023-07-11: 1 via RECTAL

## 2023-07-11 MED ORDER — POTASSIUM CHLORIDE CRYS ER 20 MEQ PO TBCR
20.0000 meq | EXTENDED_RELEASE_TABLET | Freq: Once | ORAL | Status: AC
  Administered 2023-07-11: 20 meq via ORAL
  Filled 2023-07-11: qty 1

## 2023-07-11 MED ORDER — CHLORHEXIDINE GLUCONATE CLOTH 2 % EX PADS: 6.0000 | MEDICATED_PAD | Freq: Every day | CUTANEOUS | Status: AC

## 2023-07-11 MED ORDER — PANTOPRAZOLE SODIUM 40 MG IV SOLR: 40.0000 mg | Freq: Every day | INTRAVENOUS | Status: DC

## 2023-07-11 MED ORDER — LOSARTAN POTASSIUM 50 MG PO TABS
50.0000 mg | ORAL_TABLET | Freq: Once | ORAL | Status: AC
Start: 2023-07-11 — End: 2023-07-11
  Administered 2023-07-11: 50 mg via ORAL
  Filled 2023-07-11: qty 1

## 2023-07-11 MED ORDER — IPRATROPIUM-ALBUTEROL 0.5-2.5 (3) MG/3ML IN SOLN: 3.0000 mL | Freq: Four times a day (QID) | RESPIRATORY_TRACT | Status: AC

## 2023-07-11 MED ORDER — LABETALOL HCL 5 MG/ML IV SOLN: 10.0000 mg | INTRAVENOUS | Status: DC | PRN

## 2023-07-11 MED ORDER — LOSARTAN POTASSIUM 50 MG PO TABS
100.0000 mg | ORAL_TABLET | Freq: Every day | ORAL | Status: DC
  Filled 2023-07-11: qty 2

## 2023-07-11 NOTE — Plan of Care (Signed)
  Problem: Education: Goal: Ability to describe self-care measures that may prevent or decrease complications (Diabetes Survival Skills Education) will improve Outcome: Progressing   Problem: Coping: Goal: Ability to adjust to condition or change in health will improve Outcome: Progressing   Problem: Fluid Volume: Goal: Ability to maintain a balanced intake and output will improve Outcome: Progressing   Problem: Health Behavior/Discharge Planning: Goal: Ability to identify and utilize available resources and services will improve Outcome: Progressing Goal: Ability to manage health-related needs will improve Outcome: Progressing   Problem: Metabolic: Goal: Ability to maintain appropriate glucose levels will improve Outcome: Progressing   Problem: Nutritional: Goal: Maintenance of adequate nutrition will improve Outcome: Progressing Goal: Progress toward achieving an optimal weight will improve Outcome: Progressing   Problem: Skin Integrity: Goal: Risk for impaired skin integrity will decrease Outcome: Progressing   Problem: Tissue Perfusion: Goal: Adequacy of tissue perfusion will improve Outcome: Progressing   Problem: Fluid Volume: Goal: Hemodynamic stability will improve Outcome: Progressing   Problem: Clinical Measurements: Goal: Diagnostic test results will improve Outcome: Progressing Goal: Signs and symptoms of infection will decrease Outcome: Progressing   Problem: Respiratory: Goal: Ability to maintain adequate ventilation will improve Outcome: Progressing   Problem: Education: Goal: Knowledge of General Education information will improve Description: Including pain rating scale, medication(s)/side effects and non-pharmacologic comfort measures Outcome: Progressing   Problem: Health Behavior/Discharge Planning: Goal: Ability to manage health-related needs will improve Outcome: Progressing   Problem: Clinical Measurements: Goal: Ability to maintain  clinical measurements within normal limits will improve Outcome: Progressing Goal: Will remain free from infection Outcome: Progressing Goal: Diagnostic test results will improve Outcome: Progressing Goal: Respiratory complications will improve Outcome: Progressing Goal: Cardiovascular complication will be avoided Outcome: Progressing   Problem: Activity: Goal: Risk for activity intolerance will decrease Outcome: Progressing   Problem: Nutrition: Goal: Adequate nutrition will be maintained Outcome: Progressing   Problem: Coping: Goal: Level of anxiety will decrease Outcome: Progressing   Problem: Elimination: Goal: Will not experience complications related to bowel motility Outcome: Progressing Goal: Will not experience complications related to urinary retention Outcome: Progressing   Problem: Pain Management: Goal: General experience of comfort will improve Outcome: Progressing   Problem: Safety: Goal: Ability to remain free from injury will improve Outcome: Progressing   Problem: Skin Integrity: Goal: Risk for impaired skin integrity will decrease Outcome: Progressing   Problem: Activity: Goal: Ability to tolerate increased activity will improve Outcome: Progressing   Problem: Respiratory: Goal: Ability to maintain a clear airway and adequate ventilation will improve Outcome: Progressing   Problem: Role Relationship: Goal: Method of communication will improve Outcome: Progressing

## 2023-07-11 NOTE — Progress Notes (Signed)
   Subjective/Chief Complaint: Continued improvement.   Objective: Vital signs in last 24 hours: Temp:  [98.1 F (36.7 C)-99 F (37.2 C)] 98.5 F (36.9 C) (01/19 0800) Pulse Rate:  [66-92] 75 (01/19 1100) Resp:  [16-32] 21 (01/19 1100) BP: (94-179)/(46-73) 178/66 (01/19 1100) SpO2:  [87 %-100 %] 98 % (01/19 1100) Last BM Date : 07/04/23  Intake/Output from previous day: 01/18 0701 - 01/19 0700 In: 600.9 [P.O.:240; I.V.:210.8; IV Piggyback:150] Out: 50 [Urine:50] Intake/Output this shift: Total I/O In: -  Out: 100 [Urine:100]  General appearance: alert and no distress Extremities: LEFT upper extremity AVF- no thrill, no bruit, hand warm; RIGHT femoral temp cath in place  Lab Results:  Recent Labs    07/10/23 0502 07/11/23 0414  WBC 4.9 6.3  HGB 7.1* 7.5*  HCT 21.7* 23.2*  PLT 179 175   BMET Recent Labs    07/10/23 0502 07/11/23 0414  NA 133* 131*  K 3.7 3.4*  CL 95* 94*  CO2 25 24  GLUCOSE 148* 151*  BUN 29* 46*  CREATININE 3.67* 5.30*  CALCIUM 9.0 8.5*   PT/INR No results for input(s): "LABPROT", "INR" in the last 72 hours. ABG Recent Labs    07/08/23 1926 07/09/23 0600  PHART 7.36 7.39  HCO3 27.1 21.8    Studies/Results: No results found.  Anti-infectives: Anti-infectives (From admission, onward)    Start     Dose/Rate Route Frequency Ordered Stop   07/07/23 1000  piperacillin-tazobactam (ZOSYN) IVPB 2.25 g        2.25 g 100 mL/hr over 30 Minutes Intravenous Every 8 hours 07/07/23 0813 07/11/23 2359   07/05/23 1000  cefTRIAXone (ROCEPHIN) 2 g in sodium chloride 0.9 % 100 mL IVPB  Status:  Discontinued        2 g 200 mL/hr over 30 Minutes Intravenous Every 24 hours 07/04/23 1527 07/07/23 0813   07/05/23 1000  azithromycin (ZITHROMAX) 500 mg in sodium chloride 0.9 % 250 mL IVPB        500 mg 250 mL/hr over 60 Minutes Intravenous Every 24 hours 07/04/23 1527 07/06/23 1356   07/04/23 1430  cefTRIAXone (ROCEPHIN) 2 g in sodium chloride 0.9  % 100 mL IVPB        2 g 200 mL/hr over 30 Minutes Intravenous  Once 07/04/23 1426 07/04/23 1638   07/04/23 1430  azithromycin (ZITHROMAX) 500 mg in sodium chloride 0.9 % 250 mL IVPB        500 mg 250 mL/hr over 60 Minutes Intravenous  Once 07/04/23 1426 07/04/23 1919       Assessment/Plan: Continue to use temp cath until more stable Will plan for Permacath placement and AV fistulagram/declot prior to discharge  LOS: 7 days    Christy Curry A 07/11/2023

## 2023-07-11 NOTE — Progress Notes (Addendum)
[facility]  ROUNDING NOTE   Subjective:  Christy Curry is a 68yo f with PMH of ESRD on HD, CAD, HTN, AS, HLD that came in to the hospital for shortness of breath, found to have elevated troponin with RSV in the setting of acute hypoxic respiratory failure. Patient home dialysis at [facility] MWF, followed by Dr. Cherylann Ratel. Update: Patient seen in bed, daughter at bedside. Daughter endorses concern for AVG not working well. No history of problem in outpatient. No other complaints today.    Objective:  Vital signs in last 24 hours:  Temp:  [98.1 F (36.7 C)-99 F (37.2 C)] 98.5 F (36.9 C) (01/19 0800) Pulse Rate:  [66-92] 78 (01/19 1000) Resp:  [16-32] 27 (01/19 1000) BP: (94-179)/(46-76) 176/66 (01/19 1000) SpO2:  [87 %-100 %] 98 % (01/19 1000)  Weight change:  Filed Weights   07/07/23 0330 07/08/23 0345 07/09/23 0500  Weight: 60.9 kg 61.4 kg 61.4 kg    Intake/Output: I/O last 3 completed shifts: In: 978.2 [P.O.:240; I.V.:538.1; IV Piggyback:200] Out: 50 [Urine:50]   Intake/Output this shift:  Total I/O In: -  Out: 100 [Urine:100]  Physical Exam: General: NAD,   Head: Normocephalic, atraumatic. Moist oral mucosal membranes  Eyes: Anicteric, PERRL  Neck: Supple, trachea midline  Lungs:  Diminished to auscultation, on 1L [place] o2  Heart: Regular rate and rhythm  Abdomen:  Soft, nontender,   Extremities:  Peripheral edema in right hand  Neurologic: Nonfocal, moving all four extremities  Skin: No lesions  Access: Left AVG no thrill/no bruit, rt femoral temp cath    Basic Metabolic Panel: Recent Labs  Lab 07/06/23 0403 07/07/23 0404 07/08/23 0502 07/09/23 0524 07/10/23 0502 07/11/23 0414  NA 141 136 134* 134* 133* 131*  K 4.1 4.1 4.3 4.2 3.7 3.4*  CL 104 99 97* 95* 95* 94*  CO2 23 26 24  21* 25 24  GLUCOSE 150* 134* 139* 143* 148* 151*  BUN 47* 36* 27* 24* 29* 46*  CREATININE 9.09* 5.86* 4.01* 3.79* 3.67* 5.30*  CALCIUM 8.0* 8.0*  8.8* 8.6* 9.0 8.5*  MG 2.2 2.2 2.1 2.2  --  2.3  PHOS 6.7* 6.3* 5.6* 5.1* 4.9* 4.6    Liver Function Tests: Recent Labs  Lab 07/04/23 1348 07/06/23 0403 07/07/23 0404 07/08/23 0502 07/08/23 1112 07/09/23 0524 07/10/23 0502 07/11/23 0414  AST 23  --  15  --  27  --   --   --   ALT 13  --  11  --  12  --   --   --   ALKPHOS 81  --  53  --  50  --   --   --   BILITOT 0.7  --  0.6  --  0.8  --   --   --   PROT 7.2  --  5.5*  --  5.8*  --   --   --   ALBUMIN 3.9   < > 2.6*  2.6* 3.0* 2.8* 2.8* 2.6* 2.4*   < > = values in this interval not displayed.   Recent Labs  Lab 07/04/23 1348 07/08/23 1112  LIPASE 21 19   No results for input(s): "AMMONIA" in the last 168 hours.  CBC: Recent Labs  Lab 07/04/23 1348 07/05/23 0403 07/06/23 0403 07/06/23 1302 07/07/23 0404 07/08/23 0502 07/09/23 0524 07/10/23 0502 07/11/23 0414  WBC 21.6*   < > 13.2*   < > 11.9* 12.3* 6.3 4.9 6.3  NEUTROABS 18.5*  --  11.6*  --  10.3*  --   --   --   --   HGB 11.9*   < > 7.9*   < > 7.9* 8.9* 7.8* 7.1* 7.5*  HCT 40.5   < > 24.5*   < > 24.7* 29.0* 24.6* 21.7* 23.2*  MCV 106.9*   < > 97.2   < > 96.9 99.3 95.7 94.3 95.9  PLT 295   < > 204   < > 220 275 202 179 175   < > = values in this interval not displayed.    Cardiac Enzymes: No results for input(s): "CKTOTAL", "CKMB", "CKMBINDEX", "TROPONINI" in the last 168 hours.  BNP: Invalid input(s): "POCBNP"  CBG: Recent Labs  Lab 07/10/23 1546 07/10/23 2002 07/10/23 2343 07/11/23 0345 07/11/23 0744  GLUCAP 174* 237* 194* 140* 130*    Microbiology: Results for orders placed or performed during the hospital encounter of 07/04/23  Resp panel by RT-PCR (RSV, Flu A&B, Covid) Anterior Nasal Swab     Status: Abnormal   Collection Time: 07/04/23  1:48 PM   Specimen: Anterior Nasal Swab  Result Value Ref Range Status   SARS Coronavirus 2 by RT PCR NEGATIVE NEGATIVE Final    Comment: (NOTE) SARS-CoV-2 target nucleic acids are NOT  DETECTED.  The SARS-CoV-2 RNA is generally detectable in upper respiratory specimens during the acute phase of infection. The lowest concentration of SARS-CoV-2 viral copies this assay can detect is 138 copies/mL. A negative result does not preclude SARS-Cov-2 infection and should not be used as the sole basis for treatment or other patient management decisions. A negative result may occur with  improper specimen collection/handling, submission of specimen other than nasopharyngeal swab, presence of viral mutation(s) within the areas targeted by this assay, and inadequate number of viral copies(<138 copies/mL). A negative result must be combined with clinical observations, patient history, and epidemiological information. The expected result is Negative.  Fact Sheet for Patients:  BloggerCourse.com  Fact Sheet for Healthcare Providers:  SeriousBroker.it  This test is no t yet approved or cleared by the [place] FDA and  has been authorized for detection and/or diagnosis of SARS-CoV-2 by FDA under an Emergency Use Authorization (EUA). This EUA will remain  in effect (meaning this test can be used) for the duration of the COVID-19 declaration under Section 564(b)(1) of the Act, 21 U.S.C.section 360bbb-3(b)(1), unless the authorization is terminated  or revoked sooner.       Influenza A by PCR NEGATIVE NEGATIVE Final   Influenza B by PCR NEGATIVE NEGATIVE Final    Comment: (NOTE) The Xpert Xpress SARS-CoV-2/FLU/RSV plus assay is intended as an aid in the diagnosis of influenza from Nasopharyngeal swab specimens and should not be used as a sole basis for treatment. Nasal washings and aspirates are unacceptable for Xpert Xpress SARS-CoV-2/FLU/RSV testing.  Fact Sheet for Patients: BloggerCourse.com  Fact Sheet for Healthcare Providers: SeriousBroker.it  This test is not yet  approved or cleared by the [place] FDA and has been authorized for detection and/or diagnosis of SARS-CoV-2 by FDA under an Emergency Use Authorization (EUA). This EUA will remain in effect (meaning this test can be used) for the duration of the COVID-19 declaration under Section 564(b)(1) of the Act, 21 U.S.C. section 360bbb-3(b)(1), unless the authorization is terminated or revoked.     Resp Syncytial Virus by PCR POSITIVE (A) NEGATIVE Final    Comment: (NOTE) Fact Sheet for Patients: BloggerCourse.com  Fact Sheet for Healthcare Providers: SeriousBroker.it  This test  is not yet approved or cleared by the [place] and has been authorized for detection and/or diagnosis of SARS-CoV-2 by FDA under an Emergency Use Authorization (EUA). This EUA will remain in effect (meaning this test can be used) for the duration of the COVID-19 declaration under Section 564(b)(1) of the Act, 21 U.S.C. section 360bbb-3(b)(1), unless the authorization is terminated or revoked.  Performed at [facility], [address] [place], [place] [address]   Blood Culture (routine x 2)     Status: None   Collection Time: 07/04/23  3:41 PM   Specimen: BLOOD  Result Value Ref Range Status   Specimen Description BLOOD BLOOD RIGHT WRIST  Final   Special Requests   Final    BOTTLES DRAWN AEROBIC AND ANAEROBIC Blood Culture adequate volume   Culture   Final    NO GROWTH 5 DAYS Performed at [facility], [address], [place], [place] [address]    Report Status 07/09/2023 FINAL  Final  Blood Culture (routine x 2)     Status: None   Collection Time: 07/04/23  3:41 PM   Specimen: BLOOD  Result Value Ref Range Status   Specimen Description BLOOD BLOOD RIGHT HAND  Final   Special Requests   Final    BOTTLES DRAWN AEROBIC AND ANAEROBIC Blood Culture adequate volume   Culture   Final    NO GROWTH 5 DAYS Performed at [facility], [address] [place], [place] [address]    Report Status 07/09/2023 FINAL  Final  MRSA Next Gen by PCR, Nasal     Status: None   Collection Time: 07/04/23  6:20 PM   Specimen: Nasal Mucosa; Nasal Swab  Result Value Ref Range Status   MRSA by PCR Next Gen NOT DETECTED NOT DETECTED Final    Comment: (NOTE) The GeneXpert MRSA Assay (FDA approved for NASAL specimens only), is one component of a comprehensive MRSA colonization surveillance program. It is not intended to diagnose MRSA infection nor to guide or monitor treatment for MRSA infections. Test performance is not FDA approved in patients less than 75 years old. Performed at [facility], [address] [place], [place] [address]   Culture, Respiratory w Gram Stain     Status: None   Collection Time: 07/05/23  8:07 AM   Specimen: Tracheal Aspirate; Respiratory  Result Value Ref Range Status   Specimen Description   Final    TRACHEAL ASPIRATE Performed at [facility], [address], [place], [place] [address]    Special Requests   Final    NONE Performed at [facility], [address] [place], [place] [address]    Gram Stain   Final    MODERATE WBC PRESENT, PREDOMINANTLY PMN RARE GRAM POSITIVE COCCI IN PAIRS    Culture   Final    NO GROWTH 2 DAYS Performed at [facility], [address] [place], [place] [address]    Report Status 07/08/2023 FINAL  Final  Culture, Respiratory w Gram Stain     Status: None   Collection Time: 07/05/23  3:26 PM   Specimen: Bronchoalveolar Lavage; Respiratory  Result Value Ref Range Status   Specimen Description   Final    BRONCHIAL ALVEOLAR LAVAGE Performed at [facility], [address], [place], [place] [address]    Special Requests   Final    NONE Performed at [facility], [address] [place] [place], [place] [address]  Gram Stain   Final    FEW WBC PRESENT, PREDOMINANTLY PMN NO ORGANISMS SEEN     Culture   Final    RARE Normal respiratory flora-no Staph aureus or Pseudomonas seen Performed at [facility], [address] [place], [place] [address]    Report Status 07/08/2023 FINAL  Final    Coagulation Studies: No results for input(s): "LABPROT", "INR" in the last 72 hours.  Urinalysis: No results for input(s): "COLORURINE", "LABSPEC", "PHURINE", "GLUCOSEU", "HGBUR", "BILIRUBINUR", "KETONESUR", "PROTEINUR", "UROBILINOGEN", "NITRITE", "LEUKOCYTESUR" in the last 72 hours.  Invalid input(s): "APPERANCEUR"    Imaging: No results found.   Medications:    dexmedetomidine (PRECEDEX) IV infusion Stopped (07/10/23 1007)   piperacillin-tazobactam Stopped (07/11/23 0220)   promethazine (PHENERGAN) injection (IM or IVPB) Stopped (07/08/23 0339)    aspirin EC  81 mg Oral Daily   atorvastatin  80 mg Oral QHS   carvedilol  25 mg Oral BID WC   Chlorhexidine Gluconate Cloth  6 each Topical Daily   cloNIDine  0.2 mg Oral BID   ezetimibe  10 mg Oral Daily   feeding supplement (NEPRO CARB STEADY)  237 mL Oral BID   insulin aspart  0-6 Units Subcutaneous Q4H   ipratropium-albuterol  3 mL Nebulization QID   [START ON 07/12/2023] losartan  100 mg Oral Daily   losartan  50 mg Oral Once   multivitamin  1 tablet Oral QHS   pantoprazole (PROTONIX) IV  40 mg Intravenous Daily   predniSONE  30 mg Oral Q breakfast   Followed by   Melene Muller ON 07/13/2023] predniSONE  20 mg Oral Q breakfast   Followed by   Melene Muller ON 07/16/2023] predniSONE  10 mg Oral Q breakfast   sevelamer carbonate  0.8 g Oral TID WC   acetaminophen, alteplase, bisacodyl, docusate sodium, heparin, hydrALAZINE, ipratropium-albuterol, labetalol, menthol-cetylpyridinium, morphine injection, ondansetron (ZOFRAN) IV, mouth rinse, mouth rinse, polyethylene glycol, promethazine (PHENERGAN) injection (IM or IVPB)  Assessment/ Plan:  Ms. Christy Curry is a 68 y.o.  female with past medical history of diabetes mellitus  type 2, peripheral neuropathy, GERD, hypertension, aortic stenosis, anemia of chronic kidney disease, peripheral arterial disease, ESRD on HD MWF who presented with shortness of breath and found to have RSV infection.   [facility]/MWF/[facility] [address]/left upper extremity AV graft/Left fem temp cath   1.  ESRD on HD MWF. Last dialysis 1/17. No indication for dialysis today. Plan for dialysis as per schedule Monday 1/18. Plan for patient to dialyze in unit after status change to stepdown ICU. Vascular notified of concern, follow-up for scheduling thrombectomy.   2.  Acute respiratory failure secondary to RSV infection.  Extubated 07/07/2023.  Sat 98% on 1L o2 [place]   3.  Anemia of chronic kidney disease.  Recent Labs           Lab Results  Component Value Date    HGB 7.5 (L) 07/11/2023      Consider starting Epogen next week.No Epo for now due to previous cardiac event   4.  Secondary hyperparathyroidism.  Phosphorus within target range at 5.1.  Maintain the patient on Renvela 0.8 g 3 times daily.     LOS: 7 Rakeb Kibble P Windell Moulding 1/19/202510:52 AM

## 2023-07-11 NOTE — Discharge Summary (Signed)
Physician Discharge Summary  Patient ID: Christy Curry MRN: [id number] DOB/AGE: 69-Nov-1957 69 y.o.  Admit date: 07/04/2023 Discharge date: 07/11/2023  Brief Pt Description / Synopsis:  68 year old female patient with a past medical history of peripheral artery disease, type 2 diabetes mellitus, diabetic neuropathy, GERD, hypertension, aortic stenosis, anemia of CKD, end-stage renal disease on HD via left AV fistula MWF currently on the kidney transplant list at [facility] who presented to [facility] on 01/12 with shortness of breath. Found to be in acute hypoxic respiratory failure requiring intubation and mechanical ventilation secondary to RSV pneumonia possible superimposed bacterial pneumonia and mucous plugging.  Successfully extubated.  Discharge Diagnoses:  Acute hypoxic respiratory failure RSV pneumonia with superimposed bacterial pneumonia and mucous plugging course complicated by aspiration event  NSTEMI possibly in the setting of demand ischemia  ESRD on hemodialysis  Severe aortic stenosis  HTN  HLD Type II diabetes mellitus                                                                                        DISCHARGE SUMMARY   Christy Curry is a 68 y.o. y/o female with a PMH of peripheral artery disease, type 2 diabetes mellitus, diabetic neuropathy, GERD, hypertension, aortic stenosis, anemia of CKD, end-stage renal disease on HD via left AV fistula MWF currently on the kidney transplant list at [facility] hospital who presented to [facility] on 07/04/23 with shortness of breath.   She presented from home with weakness, shortness of breath, cough for couple of days.  Initially on BiPAP with SpO2 of 100%.  Hypertensive and tachycardic.  Labs with leukocytosis neutrophilic predominant 21.6.  H&H stable.  Electrolytes unremarkable.  Creatinine 7.7 g/dL and BUN 28.  Troponin was noted to be elevated initially at 100 and subsequently at 795.  Over the course of the night her respiratory  status got worse pt required intubation and mechanical ventilation.   Chest x-ray with multifocal pneumonia.  Chest x-ray postintubation with possible mucous plugging of the right upper lobe.  RSV positive.  Pt transferring to [facility] for a higher level of care for evaluation of severe aortic stenosis.  See detailed [facility] course below under Significant Events.   SIGNIFICANT DIAGNOSTIC STUDIES 01/12: US Venous Right Upper Extrem: Nonocclusive superficial venous thrombosis in the right basilic vein. Otherwise unremarkable. 01/14: Echo: EF 50 to 55%; grade II diastolic dysfunction; elevated left atrial pressure; mild to moderate mitral valve regurgitation; mild to moderate tricuspid valve regurgitation; severe aortic valve stenosis  01/14: CT Abd Pelvis W Contrast>> No CT evidence for acute intra-abdominal or pelvic abnormality. No bowel obstruction or acute bowel wall thickening. Enteric tube tip in the distal stomach. Moderate incompletely visualized moderate bilateral pleural effusions with extensive consolidation and ground-glass density in the lung bases, suspect for multifocal pneumonia. There may be associated/underlying edema. Gallstones. Aortic atherosclerosis.    SIGNIFICANT EVENTS 07/05/2023 Intubation and MV, s/p bronchoscopy and airway clearance.  07/06/2023 Extubated but unfortunately had a multiple vomiting events through the day and night --> significant aspiration event requiring reintubation 07/06/2023 at night.  07/07/2023 Tolerated SBT well. This AM and overnight, she went  into moderate respiratory distress. Requiring bipap support. Appears to be doing well on bipap. 07/08/2023 Patient with respiratory distress overnight, hypertension and tachycardic. Unable to take PO meds. Started on Nitrodrip, IV metoprolol x1 and started on clonidine patch.  07/08/2022 Patient looking better today. Blood pressure improving. Remaining on Nitroglycerine. AV fistula  clotted. Possible angiogram with vascular on 07/12/23  07/12/2023: Off pressors, off oxygen. To receive HD today.  Plan for transfer to [facility]  Tentative plan for Permcath placement tomorrow if hasn't transferred to [facility].  MICRO DATA  01/12: MRSA PCR>>negative  01/12: COVID>>negative  01/12: Influenza A&B>>negative  01/12: RSV>>positive  01/12: Blood x2>>negative  01/13: Tracheal aspirate>>negative  01/13: BAL>>rare normal respiratory flora-no Staph aureus or pseudomonas   ANTIBIOTICS Anti-infectives (From admission, onward)    Start     Dose/Rate Route Frequency Ordered Stop   07/11/23 0000  piperacillin-tazobactam (ZOSYN) 2-0.25 GM/50ML IVPB        2.25 g Intravenous Every 8 hours 07/11/23 1518     07/07/23 1000  piperacillin-tazobactam (ZOSYN) IVPB 2.25 g        2.25 g 100 mL/hr over 30 Minutes Intravenous Every 8 hours 07/07/23 0813 07/11/23 2359   07/05/23 1000  cefTRIAXone (ROCEPHIN) 2 g in sodium chloride 0.9 % 100 mL IVPB  Status:  Discontinued        2 g 200 mL/hr over 30 Minutes Intravenous Every 24 hours 07/04/23 1527 07/07/23 0813   07/05/23 1000  azithromycin (ZITHROMAX) 500 mg in sodium chloride 0.9 % 250 mL IVPB        500 mg 250 mL/hr over 60 Minutes Intravenous Every 24 hours 07/04/23 1527 07/06/23 1356   07/04/23 1430  cefTRIAXone (ROCEPHIN) 2 g in sodium chloride 0.9 % 100 mL IVPB        2 g 200 mL/hr over 30 Minutes Intravenous  Once 07/04/23 1426 07/04/23 1638   07/04/23 1430  azithromycin (ZITHROMAX) 500 mg in sodium chloride 0.9 % 250 mL IVPB        500 mg 250 mL/hr over 60 Minutes Intravenous  Once 07/04/23 1426 07/04/23 1919       CONSULTS Intensivist  Vascular  Nephrology  Cardiology   TUBES / LINES Right Femoral Trialysis Catheter 07/05/23>>   TREATMENT PLAN BY DISCHARGE DIAGNOSIS:  #Acute Hypoxic Respiratory Failure secondary to RSV Pneumonia and questionable superimposed bacterial pneumonia EXTUBATED 07/07/23 -Supplemental O2 as needed to  maintain O2 sats >92% -BiPAP if needed -Follow intermittent Chest X-ray & ABG as needed -Bronchodilators -Steroids -ABX as above -Volume removal with HD -Pulmonary toilet as able  #NSTEMI #Severe Aortic Stenosis #Hypertension -Continuous cardiac monitoring -Maintain MAP >65 -Volume removal with HD -Cardiology following, appreciate input -Continue Coreg, Clonidine, and Losartan  -Transfer to [facility]  #ESRD on HD (M,W,F) -Monitor I&O's / urinary output -Follow BMP -Ensure adequate renal perfusion -Avoid nephrotoxic agents as able -Replace electrolytes as indicated ~ Pharmacy following for assistance with electrolyte replacement -Nephrology following, appreciate input ~ HD as per Nephrology -Vascular Surgery planning on Permcath placement 1/21  #Diabetes Mellitus Type II -CBG's q4h; Target range of 140 to 180 -SSI -Follow ICU Hypo/Hyperglycemia protocol     Discharge Exam: General: Awake and alert, resting in bed, in NAD. Neuro: A&O x 3, non-focal.  HEENT: [place]/AT. PERRL, sclerae anicteric. Cardiovascular: RRR, no M/R/G.  Lungs: Respirations even and unlabored.  CTA bilaterally, No W/R/R.  Abdomen: BS x 4, soft, NT/ND.  Musculoskeletal: No gross deformities, no edema.  Skin: Intact, warm, no rashes.  Vitals:   07/11/23 1300 07/11/23 1400 07/11/23 1410 07/11/23 1430  BP: (!) 174/72 (!) 171/65 (!) 171/65 (!) 141/57  Pulse: 69 69  73  Resp: (!) 23 17  (!) 22  Temp:      TempSrc:      SpO2: 98% 99%  99%  Weight:      Height:         Discharge Labs  BMET Recent Labs  Lab 07/06/23 0403 07/07/23 0404 07/08/23 0502 07/09/23 0524 07/10/23 0502 07/11/23 0414  NA 141 136 134* 134* 133* 131*  K 4.1 4.1 4.3 4.2 3.7 3.4*  CL 104 99 97* 95* 95* 94*  CO2 23 26 24  21* 25 24  GLUCOSE 150* 134* 139* 143* 148* 151*  BUN 47* 36* 27* 24* 29* 46*  CREATININE 9.09* 5.86* 4.01* 3.79* 3.67* 5.30*  CALCIUM 8.0* 8.0* 8.8* 8.6* 9.0 8.5*  MG 2.2 2.2 2.1 2.2  --  2.3  PHOS  6.7* 6.3* 5.6* 5.1* 4.9* 4.6    CBC Recent Labs  Lab 07/09/23 0524 07/10/23 0502 07/11/23 0414  HGB 7.8* 7.1* 7.5*  HCT 24.6* 21.7* 23.2*  WBC 6.3 4.9 6.3  PLT 202 179 175    Anti-Coagulation Recent Labs  Lab 07/04/23 1541  INR 1.2          Allergies as of 07/11/2023       Reactions   2,4-d Dimethylamine Swelling   Other Swelling   Tetanus Antitoxin Itching   Tetanus Toxoid    Tetanus Toxoids Itching        Medication List     STOP taking these medications    amLODipine 10 MG tablet Commonly known as: NORVASC   carvedilol 25 MG tablet Commonly known as: COREG   cloNIDine 0.1 MG tablet Commonly known as: CATAPRES   cloNIDine 0.2 MG tablet Commonly known as: CATAPRES   ezetimibe 10 MG tablet Commonly known as: ZETIA   glipiZIDE 2.5 MG 24 hr tablet Commonly known as: GLUCOTROL XL   losartan 100 MG tablet Commonly known as: COZAAR   ondansetron 4 MG tablet Commonly known as: ZOFRAN Replaced by: ondansetron 4 MG/2ML Soln injection   pantoprazole 40 MG tablet Commonly known as: Protonix Replaced by: pantoprazole 40 MG injection       TAKE these medications    acetaminophen 325 MG tablet Commonly known as: TYLENOL Take 2 tablets (650 mg total) by mouth every 6 (six) hours as needed for fever or headache.   aspirin EC 81 MG tablet Take 1 tablet (81 mg total) by mouth daily. Swallow whole. Start taking on: July 12, 2023   atorvastatin 80 MG tablet Commonly known as: LIPITOR Take 1 tablet (80 mg total) by mouth at bedtime. What changed:  medication strength how much to take   bisacodyl 10 MG suppository Commonly known as: DULCOLAX Place 1 suppository (10 mg total) rectally daily as needed for moderate constipation.   Chlorhexidine Gluconate Cloth 2 % Pads Apply 6 each topically daily at 6 (six) AM. Start taking on: July 12, 2023   docusate sodium 100 MG capsule Commonly known as: COLACE Take 1 capsule (100 mg total) by  mouth 2 (two) times daily as needed for mild constipation.   feeding supplement (NEPRO CARB STEADY) Liqd Take 237 mLs by mouth 2 (two) times daily.   heparin 5000 UNIT/ML injection Inject 1 mL (5,000 Units total) into the skin every 8 (eight) hours.   hydrALAZINE 20 MG/ML injection Commonly known as: APRESOLINE Inject  0.5-1 mLs (10-20 mg total) into the vein every 4 (four) hours as needed (SBP > 150, 1st line).   insulin aspart 100 UNIT/ML injection Commonly known as: novoLOG Inject 0-6 Units into the skin every 4 (four) hours.   ipratropium-albuterol 0.5-2.5 (3) MG/3ML Soln Commonly known as: DUONEB Take 3 mLs by nebulization every 6 (six) hours as needed.   ipratropium-albuterol 0.5-2.5 (3) MG/3ML Soln Commonly known as: DUONEB Take 3 mLs by nebulization 4 (four) times daily.   labetalol 5 MG/ML injection Commonly known as: NORMODYNE Inject 2 mLs (10 mg total) into the vein every 2 (two) hours as needed (SBP > 150).   menthol-cetylpyridinium 3 MG lozenge Commonly known as: CEPACOL Take 1 lozenge (3 mg total) by mouth as needed for sore throat.   multivitamin Tabs tablet Take 1 tablet by mouth at bedtime. What changed:  medication strength when to take this   ondansetron 4 MG/2ML Soln injection Commonly known as: ZOFRAN Inject 2 mLs (4 mg total) into the vein every 6 (six) hours as needed for nausea or vomiting. Replaces: ondansetron 4 MG tablet   pantoprazole 40 MG injection Commonly known as: PROTONIX Inject 40 mg into the vein daily. Start taking on: July 12, 2023 Replaces: pantoprazole 40 MG tablet   piperacillin-tazobactam 2-0.25 GM/50ML IVPB Commonly known as: ZOSYN Inject 50 mLs (2.25 g total) into the vein every 8 (eight) hours.   polyethylene glycol 17 g packet Commonly known as: MIRALAX / GLYCOLAX Take 17 g by mouth daily as needed for moderate constipation.   predniSONE 10 MG tablet Commonly known as: DELTASONE Take 3 tablets (30 mg total) by  mouth daily with breakfast for 1 day, THEN 2 tablets (20 mg total) daily with breakfast for 3 days, THEN 1 tablet (10 mg total) daily with breakfast for 3 days. Start taking on: July 12, 2023   sevelamer carbonate 0.8 g Pack packet Commonly known as: RENVELA Take 0.8 g by mouth 3 (three) times daily with meals.         Disposition: Transfer to [facility]   Discharged Condition: Benefits outweigh risk of transfer      Harlon Ditty, AGACNP-BC [facility] Prefer epic messenger for cross cover needs If after hours, please call E-link

## 2023-07-11 NOTE — Progress Notes (Addendum)
NAME:  Christy Curry, MRN:  [id number], DOB:  03/04/1956, LOS: 7 ADMISSION DATE:  07/04/2023 History of Present Illness:    Case of 68 year old female patient with a past medical history of peripheral artery disease, type 2 diabetes mellitus, diabetic neuropathy, GERD, hypertension, aortic stenosis, anemia of CKD, end-stage renal disease on HD via left AV fistula MWF currently on the kidney transplant list at [facility] who presented to [facility] on 01/12 with shortness of breath.   History taken mostly from chart review.   She presents from home with weakness, shortness of breath, cough for couple of days.  Initially on BiPAP with SpO2 of 100%.  Hypertensive and tachycardic.  Labs with leukocytosis neutrophilic predominant 21.6.  H&H stable.  Electrolytes unremarkable.  Creatinine 7.7 g/dL and BUN 28.  Troponin was noted to be elevated initially at 100 and subsequently at 795.  Over the course of the night her respiratory status got worse required intubation and mechanical ventilation.   Chest x-ray with multifocal pneumonia.  Chest x-ray postintubation with possible mucous plugging of the right upper lobe.  RSV positive.    Pertinent  Medical History  -- Hypertension on Amlodipine, Carvedilol, Clonidine, losartan -- Type II DM Glipizide -- HLD on atorvastatin and ezetimibe -- GERD on PPI  -- ESRD on HD, Sevelamer  Significant Hospital Events: Including procedures, antibiotic start and stop dates in addition to other pertinent events   07/05/2023 Intubation and MV, s/p bronchoscopy and airway clearance.  07/06/2023 Extubated but unfortunately had a multiple vomiting events through the day and night --> significant aspiration event requiring reintubation 07/06/2023 at night.  07/07/2023 Tolerated SBT well. This AM and overnight, she went into moderate respiratory distress. Requiring bipap support. Appears to be doing well on bipap. 07/08/2023 Patient with respiratory distress overnight,  hypertension and tachycardic. Unable to take PO meds. Started on Nitrodrip, IV metoprolol x1 and started on clonidine patch.  07/08/2022 Patient looking better today. Blood pressure improving. Remaining on Nitroglycerine. AV fistula clotted. Possible angiogram with vascular in the am.   Interim History / Subjective:  Awake alert and following commands. On 2L [place]. Off Nitrodrip.   Objective   Blood pressure (!) 179/73, pulse 81, temperature 98.5 F (36.9 C), temperature source Oral, resp. rate (!) 21, height 5\' 1"  (1.549 m), weight 61.4 kg, SpO2 97%.        Intake/Output Summary (Last 24 hours) at 07/11/2023 0947 Last data filed at 07/11/2023 0825 Gross per 24 hour  Intake 300.62 ml  Output 150 ml  Net 150.62 ml   Filed Weights   07/07/23 0330 07/08/23 0345 07/09/23 0500  Weight: 60.9 kg 61.4 kg 61.4 kg    Examination: GEN On 2L [place], appears comfortable in no distress.   HEENT supple neck, reactive pupils CVS loud systolic murmur heard best at the right sternal border Lungs Clear bilateral air entry  Abdomen soft nontender nondistended positive bowel sounds Extremities warm well-perfused no edema   Labs and imaging were reviewed  Assessment & Plan:  Case of 68 year old female patient with a past medical history of peripheral artery disease, type 2 diabetes mellitus, diabetic neuropathy, GERD, hypertension, aortic stenosis, anemia of CKD, end-stage renal disease on HD via left AV fistula MWF currently on the kidney transplant list at [facility] who presented to [facility] on 01/12 with shortness of breath.  Found to be in acute hypoxic respiratory failure requiring intubation and mechanical ventilation secondary to RSV pneumonia possible superimposed bacterial pneumonia and mucous  plugging.   #Acute hypoxic respiratory failure requiring intubation and mechanical ventilation 07/05/2023 secondary to.... #RSV pneumonia with possible superimposed bacterial pneumonia and mucous plugging s/p  extubation 07/05/2022 with course complicated by aspiration event requring re-intubation 07/05/2022 at night and now re-extubated 07/06/2022.  #NSTEMI possibly in the setting of demand but does have a hx of CAD.  #History of end-stage renal disease on HD MWF via left AV fistula #Severe aortic stenosis with AV area surface 0.68 cm. MG 47 mmHg.  #Hypertension, hyperlipidemia and type 2 diabetes mellitus   She is improving from a respiratory standpoint. Currently actively managing her blood pressure and volume status with HD. More pressing, she does have Severe aortic stenosis with area 0.6 cm2 and MG 57. Discussed with [facility] again today as she was scheduled for a [facility] in Feb. Pending call back.   Neuro: Precedex.  CVS: Goal MAP > = 65. C/w clonidine 0.2 BID and increase losartan to full home dose. C/w home carvedilol. Wean off Nitroglycerin drip for SBP goal 140. Appreciate cards recs. Discussed with Cards at [facility] patient is accepted and pending bed placement.  Lungs: MRSA swab -, RSV +, Trach aspirate remains negative. Completed Azithromycin. C/w zosyn and complete a 5 days course given inhospital aspiration event. Decrease Prednisone to 30mg  and taper by 10mg  every 3 days.  GI: Dysph 2 diet PPI daily. Lax as needed. Fleet Enema for stool impaction.   Renal: Renal onboard. HD MWF c/w home sevelamer.  Heme: Heparin drip to complete 72hours.  Endo: ISS POC goal 140-180   Best Practice (right click and "Reselect all SmartList Selections" daily)   Diet/type: Dysph 2 diet with 2L Fluid restriction DVT prophylaxis systemic heparin Pressure ulcer(s): N/A GI prophylaxis: PPI Lines: Dialysis Catheter Foley:  N/A Code Status:  full code  Last date of multidisciplinary goals of care discussion [07/11/2023]  I spent 45 minutes caring for this patient today, including preparing to see the patient, obtaining a medical history , reviewing a separately obtained history, performing a medically appropriate  examination and/or evaluation, ordering medications, tests, or procedures, documenting clinical information in the electronic health record, and independently interpreting results (not separately reported/billed) and communicating results to the patient/family/caregiver  Janann Colonel, MD [facility] 07/11/2023 9:47 AM

## 2023-07-11 NOTE — Progress Notes (Addendum)
Progress Note  Patient Name: Christy Curry Date of Encounter: 07/11/2023  Primary Cardiologist: Mariah Milling  Subjective   No acute events overnight.  O2 saturations 98% on O2 1 L .  BP elevated at 176/66 mmHg today.  Awaiting callback from [facility] about transfer Inpatient Medications    Scheduled Meds:  aspirin EC  81 mg Oral Daily   atorvastatin  80 mg Oral QHS   carvedilol  25 mg Oral BID WC   Chlorhexidine Gluconate Cloth  6 each Topical Daily   cloNIDine  0.2 mg Oral BID   ezetimibe  10 mg Oral Daily   feeding supplement (NEPRO CARB STEADY)  237 mL Oral BID   insulin aspart  0-6 Units Subcutaneous Q4H   ipratropium-albuterol  3 mL Nebulization QID   [START ON 07/12/2023] losartan  100 mg Oral Daily   losartan  50 mg Oral Once   multivitamin  1 tablet Oral QHS   pantoprazole (PROTONIX) IV  40 mg Intravenous Daily   predniSONE  30 mg Oral Q breakfast   Followed by   Melene Muller ON 07/13/2023] predniSONE  20 mg Oral Q breakfast   Followed by   Melene Muller ON 07/16/2023] predniSONE  10 mg Oral Q breakfast   sevelamer carbonate  0.8 g Oral TID WC   Continuous Infusions:  dexmedetomidine (PRECEDEX) IV infusion Stopped (07/10/23 1007)   nitroGLYCERIN Stopped (07/10/23 1821)   piperacillin-tazobactam Stopped (07/11/23 0220)   promethazine (PHENERGAN) injection (IM or IVPB) Stopped (07/08/23 0339)   PRN Meds: acetaminophen, alteplase, bisacodyl, docusate sodium, heparin, hydrALAZINE, ipratropium-albuterol, labetalol, menthol-cetylpyridinium, morphine injection, ondansetron (ZOFRAN) IV, mouth rinse, mouth rinse, polyethylene glycol, promethazine (PHENERGAN) injection (IM or IVPB)   Vital Signs    Vitals:   07/11/23 0930 07/11/23 0937 07/11/23 0942 07/11/23 1000  BP:    (!) 176/66  Pulse: 78 81  78  Resp: 20 (!) 21  (!) 27  Temp:      TempSrc:      SpO2: 94% (!) 87% 97% 98%  Weight:      Height:        Intake/Output Summary (Last 24 hours) at 07/11/2023 1006 Last data filed at  07/11/2023 0825 Gross per 24 hour  Intake 220.28 ml  Output 150 ml  Net 70.28 ml   Filed Weights   07/07/23 0330 07/08/23 0345 07/09/23 0500  Weight: 60.9 kg 61.4 kg 61.4 kg    Telemetry    NSR - Personally Reviewed  ECG    No new tracings - Personally Reviewed  Physical Exam   GEN: Well nourished, well developed in no acute distress HEENT: Normal NECK: No JVD; No carotid bruits LYMPHATICS: No lymphadenopathy CARDIAC:RRR, no rubs, gallops 2/6 harsh late peaking SM at the RUSB RESPIRATORY:  Clear to auscultation without rales, wheezing or rhonchi  ABDOMEN: Soft, non-tender, non-distended MUSCULOSKELETAL:  No edema; No deformity  SKIN: Warm and dry NEUROLOGIC:  Alert and oriented x 3 PSYCHIATRIC:  Normal affect  Labs    Chemistry Recent Labs  Lab 07/04/23 1348 07/05/23 0403 07/07/23 0404 07/08/23 0502 07/08/23 1112 07/09/23 0524 07/10/23 0502 07/11/23 0414  NA 140   < > 136   < >  --  134* 133* 131*  K 3.8   < > 4.1   < >  --  4.2 3.7 3.4*  CL 106   < > 99   < >  --  95* 95* 94*  CO2 19*   < > 26   < >  --  21* 25 24  GLUCOSE 171*   < > 134*   < >  --  143* 148* 151*  BUN 28*   < > 36*   < >  --  24* 29* 46*  CREATININE 7.07*   < > 5.86*   < >  --  3.79* 3.67* 5.30*  CALCIUM 9.1   < > 8.0*   < >  --  8.6* 9.0 8.5*  PROT 7.2  --  5.5*  --  5.8*  --   --   --   ALBUMIN 3.9   < > 2.6*  2.6*   < > 2.8* 2.8* 2.6* 2.4*  AST 23  --  15  --  27  --   --   --   ALT 13  --  11  --  12  --   --   --   ALKPHOS 81  --  53  --  50  --   --   --   BILITOT 0.7  --  0.6  --  0.8  --   --   --   GFRNONAA 6*   < > 7*   < >  --  12* 13* 8*  ANIONGAP 15   < > 11   < >  --  18* 13 13   < > = values in this interval not displayed.     Hematology Recent Labs  Lab 07/09/23 0524 07/10/23 0502 07/11/23 0414  WBC 6.3 4.9 6.3  RBC 2.57* 2.30* 2.42*  HGB 7.8* 7.1* 7.5*  HCT 24.6* 21.7* 23.2*  MCV 95.7 94.3 95.9  MCH 30.4 30.9 31.0  MCHC 31.7 32.7 32.3  RDW 14.4 14.2 14.3   PLT 202 179 175    Cardiac EnzymesNo results for input(s): "TROPONINI" in the last 168 hours. No results for input(s): "TROPIPOC" in the last 168 hours.   BNPNo results for input(s): "BNP", "PROBNP" in the last 168 hours.   DDimer No results for input(s): "DDIMER" in the last 168 hours.   Radiology    DG Abd 1 View Result Date: 07/08/2023 IMPRESSION: Nonobstructive bowel gas pattern. Electronically Signed   By: Elgie Collard M.D.   On: 07/08/2023 11:01   DG Chest Port 1 View Result Date: 07/08/2023 IMPRESSION: 1. Support apparatus, as above. 2. Severe but improving multilobar bilateral pneumonia, as above. 3. Small bilateral pleural effusions. 4. Aortic atherosclerosis. Electronically Signed   By: Trudie Reed M.D.   On: 07/08/2023 05:20    Cardiac Studies   2D echo 07/06/2023: 1. Left ventricular ejection fraction, by estimation, is 50 to 55%. The  left ventricle has low normal function. Left ventricular endocardial  border not optimally defined to evaluate regional wall motion. There is  mild left ventricular hypertrophy. Left  ventricular diastolic parameters are consistent with Grade II diastolic  dysfunction (pseudonormalization). Elevated left atrial pressure.   2. Right ventricular systolic function is normal. The right ventricular  size is normal. There is normal pulmonary artery systolic pressure.   3. The mitral valve is degenerative. Mild to moderate mitral valve  regurgitation. Moderate mitral annular calcification.   4. Tricuspid valve regurgitation is mild to moderate.   5. The aortic valve has an indeterminant number of cusps. There is severe  calcifcation of the aortic valve. There is moderate thickening of the  aortic valve. Aortic valve regurgitation is trivial. Severe aortic valve  stenosis. Aortic valve area, by VTI  measures 0.70 cm. Aortic valve  mean gradient measures 47.0 mmHg.   6. The inferior vena cava is normal in size with <50% respiratory   variability, suggesting right atrial pressure of 8 mmHg.  Patient Profile     68 y.o. 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   Assessment & Plan    1. Acute hypoxic respiratory failure: -She was admitted on 07/04/2023 with acute hypoxic respiratory failure requiring mechanical ventilation in the setting of RSV pneumonia with possible superimposed bacterial pneumonia and mucous plugging s/p extubation on 07/05/2022 with course complicated by aspiration pneumonia requiring reintubation on 07/05/2022, now extubated on 07/07/2023 -Management per CCM   2. Severe aortic stenosis: -Echo this admission with severe aortic stenosis with a mean gradient of 47 mmHg and a valve area 0.71 cm.  This is progression from echo obtained at [facility] in 02/2023 at which time she had a mean gradient of 29 mmHg and a valve area of 1.0 cm.  It is unclear if some of this change is in the setting of her acute illness -Do not suspect that this is the primary driving force of her respiratory failure, though it is certainly contributory -She will require further evaluation with [facility] as outlined below once she is improved from her acute pulmonary illness>> all her cardiac workup is being  done at [facility] and patient is requesting transfer there -Primary service has placed a call again to [facility] today to arrange transfer so that she can get her cardiac workup done there once her respiratory issues have resolved   3.  CAD with NSTEMI: -No chest pain -Initially noted to have mildly elevated high-sensitivity troponin felt to be reflective of supply/demand ischemia secondary to severe acute pulmonary illness, severe aortic stenosis, anemia of chronic disease, and ESRD.  On 1/16 if she had further elevation of troponin peaking at 5054.  Patient continued to deny symptoms of chest pain at that time.  It is unclear if this is reflective of ongoing supply/demand ischemia in the setting of her acute pulmonary illness requiring intubation with subsequent reintubation versus acute plaque rupture with NSTEMI.  -Echo with low normal LV systolic function -She prefers to pursue cardiac cath as a scheduled with [facility] next month, given that she is without symptoms of angina, in the setting of her acute pulmonary illness there is no emergent indication for cardiac cath at this time.  However, if she becomes unstable this would need to be revisited -Agree with primary service in discussions with possible transfer to [facility] -Continue aspirin 81 mg daily, atorvastatin 80 mg daily, carvedilol 25 mg twice daily -Need to wean NTG drip off given her severe AS .  If she has chest pain would use morphine  4.  Hypertension -BP elevated on exam today -Continue carvedilol 25 mg twice daily, clonidine 0.2 mg twice daily which was increased yesterday and agree with increasing losartan to  100 mg dai    I spent 35 minutes caring for this patient today face to face, ordering and reviewing labs, reviewing records from 2D echo  seeing the patient, documenting in the record  For questions or updates, please contact [facility] Please consult [email or web address] for contact info under Cardiology/STEMI.    Signed, Eula Listen,  PA-C [facility] Pager: [phone] 07/11/2023, 10:06 AM

## 2023-07-12 DIAGNOSIS — J9602 Acute respiratory failure with hypercapnia: Secondary | ICD-10-CM | POA: Diagnosis not present

## 2023-07-12 DIAGNOSIS — J9601 Acute respiratory failure with hypoxia: Secondary | ICD-10-CM

## 2023-07-12 DIAGNOSIS — J189 Pneumonia, unspecified organism: Secondary | ICD-10-CM | POA: Diagnosis not present

## 2023-07-12 DIAGNOSIS — N186 End stage renal disease: Secondary | ICD-10-CM | POA: Diagnosis not present

## 2023-07-12 LAB — CBC
HCT: 25.1 % — ABNORMAL LOW (ref 36.0–46.0)
Hemoglobin: 8.2 g/dL — ABNORMAL LOW (ref 12.0–15.0)
MCH: 30.9 pg (ref 26.0–34.0)
MCHC: 32.7 g/dL (ref 30.0–36.0)
MCV: 94.7 fL (ref 80.0–100.0)
Platelets: 186 10*3/uL (ref 150–400)
RBC: 2.65 MIL/uL — ABNORMAL LOW (ref 3.87–5.11)
RDW: 14.4 % (ref 11.5–15.5)
WBC: 7 10*3/uL (ref 4.0–10.5)
nRBC: 0 % (ref 0.0–0.2)

## 2023-07-12 LAB — RENAL FUNCTION PANEL
Albumin: 2.6 g/dL — ABNORMAL LOW (ref 3.5–5.0)
Anion gap: 13 (ref 5–15)
BUN: 57 mg/dL — ABNORMAL HIGH (ref 8–23)
CO2: 24 mmol/L (ref 22–32)
Calcium: 8.6 mg/dL — ABNORMAL LOW (ref 8.9–10.3)
Chloride: 94 mmol/L — ABNORMAL LOW (ref 98–111)
Creatinine, Ser: 6.89 mg/dL — ABNORMAL HIGH (ref 0.44–1.00)
GFR, Estimated: 6 mL/min — ABNORMAL LOW (ref >60)
Glucose, Bld: 128 mg/dL — ABNORMAL HIGH (ref 70–99)
Phosphorus: 5.4 mg/dL — ABNORMAL HIGH (ref 2.5–4.6)
Potassium: 3.7 mmol/L (ref 3.5–5.1)
Sodium: 131 mmol/L — ABNORMAL LOW (ref 135–145)

## 2023-07-12 LAB — MAGNESIUM: Magnesium: 2.4 mg/dL (ref 1.7–2.4)

## 2023-07-12 LAB — GLUCOSE, CAPILLARY
Glucose-Capillary: 141 mg/dL — ABNORMAL HIGH (ref 70–99)
Glucose-Capillary: 106 mg/dL — ABNORMAL HIGH (ref 70–99)
Glucose-Capillary: 106 mg/dL — ABNORMAL HIGH (ref 70–99)
Glucose-Capillary: 175 mg/dL — ABNORMAL HIGH (ref 70–99)

## 2023-07-12 MED ORDER — IPRATROPIUM-ALBUTEROL 0.5-2.5 (3) MG/3ML IN SOLN
3.0000 mL | Freq: Three times a day (TID) | RESPIRATORY_TRACT | Status: DC
  Administered 2023-07-12: 3 mL via RESPIRATORY_TRACT
  Filled 2023-07-12: qty 3

## 2023-07-12 MED ORDER — EPOETIN ALFA-EPBX 10000 UNIT/ML IJ SOLN
10000.0000 [IU] | INTRAMUSCULAR | Status: DC
  Filled 2023-07-12: qty 1

## 2023-07-12 MED ORDER — EPOETIN ALFA-EPBX 10000 UNIT/ML IJ SOLN
INTRAMUSCULAR | Status: AC
  Filled 2023-07-12: qty 1

## 2023-07-12 MED ORDER — IPRATROPIUM-ALBUTEROL 0.5-2.5 (3) MG/3ML IN SOLN: 3.0000 mL | Freq: Three times a day (TID) | RESPIRATORY_TRACT | Status: DC

## 2023-07-12 MED ORDER — TRAZODONE HCL 50 MG PO TABS: 50.0000 mg | ORAL_TABLET | Freq: Every evening | ORAL | Status: DC | PRN

## 2023-07-12 NOTE — Evaluation (Signed)
Occupational Therapy Evaluation Patient Details Name: Christy Curry MRN: [id number] DOB: 1955/08/21 Today's Date: 07/12/2023   History of Present Illness Pt is a 68 y/o F admitted on 07/04/23 after presenting with c/o SOB. Pt tested + for RSV, found to have multifocal PNA, required intubation, successful re-extubation 07/07/23. PMH: PAD, DM2, diabetic neuropathy, GERD, HTN, aortic stenosis, anemia of CKD, ESRD On HD MWF (currently on kidney transplant list)   Clinical Impression   Christy Curry was seen for OT evaluation this date (MD cleared for one time limited mobility with fem HD cath in place start/end of session). Prior to hospital admission, pt was MOD I using rollator. Pt lives alone. Pt currently requires no assist rolling and MAX A pericare at bed level. MIN A + RW for ADL t/f. SUPERVISION don B socks in sitting. Pt would benefit from skilled OT to address noted impairments and functional limitations (see below for any additional details). Upon hospital discharge, recommend OT follow up <3 hours/day if unable to have initial 24/7 assistance at home.     If plan is discharge home, recommend the following: A little help with walking and/or transfers;A little help with bathing/dressing/bathroom;Help with stairs or ramp for entrance    Functional Status Assessment  Patient has had a recent decline in their functional status and demonstrates the ability to make significant improvements in function in a reasonable and predictable amount of time.  Equipment Recommendations  BSC/3in1    Recommendations for Other Services       Precautions / Restrictions Precautions Precautions: Fall Precaution Comments: R temp fem cath Restrictions Weight Bearing Restrictions Per Provider Order: No      Mobility Bed Mobility Overal bed mobility: Needs Assistance Bed Mobility: Supine to Sit, Rolling Rolling: Contact guard assist   Supine to sit: Min assist          Transfers Overall  transfer level: Needs assistance Equipment used: Rolling walker (2 wheels) Transfers: Sit to/from Stand Sit to Stand: Min assist                  Balance Overall balance assessment: Needs assistance Sitting-balance support: No upper extremity supported, Feet supported Sitting balance-Leahy Scale: Good     Standing balance support: Bilateral upper extremity supported Standing balance-Leahy Scale: Fair                             ADL either performed or assessed with clinical judgement   ADL Overall ADL's : Needs assistance/impaired                                       General ADL Comments: MAX A pericare at bed level. MIN A + RW for simulated toilet t/f. SUPERVISION don B socks in sitting.               Pertinent Vitals/Pain Pain Assessment Pain Assessment: No/denies pain     Extremity/Trunk Assessment Upper Extremity Assessment Upper Extremity Assessment: Overall WFL for tasks assessed   Lower Extremity Assessment Lower Extremity Assessment: Overall WFL for tasks assessed;Generalized weakness (c/o RLE feeling weak & stiff)       Communication Communication Communication: No apparent difficulties   Cognition Arousal: Alert Behavior During Therapy: WFL for tasks assessed/performed Overall Cognitive Status: Within Functional Limits for tasks assessed  General Comments  Pt on room air, SpO2 >/= 90%, no c/o SOB.            Home Living Family/patient expects to be discharged to:: Private residence Living Arrangements: Alone Available Help at Discharge: Family;Available 24 hours/day Type of Home: House Home Access: Stairs to enter [employer] of Steps: 2 Entrance Stairs-Rails: None Home Layout: Two level;Able to live on main level with bedroom/bathroom     Bathroom Shower/Tub: Walk-in shower         Home Equipment: Rollator (4 wheels)           Prior Functioning/Environment Prior Level of Function : Independent/Modified Independent             Mobility Comments: mod I with rollator          OT Problem List: Decreased strength;Decreased activity tolerance;Impaired balance (sitting and/or standing)      OT Treatment/Interventions: Self-care/ADL training;Therapeutic exercise;Energy conservation;DME and/or AE instruction;Therapeutic activities    OT Goals(Current goals can be found in the care plan section) Acute Rehab OT Goals Patient Stated Goal: to go home OT Goal Formulation: With patient Time For Goal Achievement: 07/26/23 Potential to Achieve Goals: Good ADL Goals Pt Will Perform Grooming: standing;Independently Pt Will Perform Lower Body Dressing: with modified independence;sit to/from stand Pt Will Transfer to Toilet: with modified independence;ambulating;regular height toilet  OT Frequency: Min 1X/week    Co-evaluation              AM-PAC OT "6 Clicks" Daily Activity     Outcome Measure Help from another person eating meals?: None Help from another person taking care of personal grooming?: A Little Help from another person toileting, which includes using toliet, bedpan, or urinal?: A Little Help from another person bathing (including washing, rinsing, drying)?: A Little Help from another person to put on and taking off regular upper body clothing?: None Help from another person to put on and taking off regular lower body clothing?: A Little 6 Click Score: 20   End of Session Equipment Utilized During Treatment: Rolling walker (2 wheels) Nurse Communication: Mobility status  Activity Tolerance: Patient tolerated treatment well Patient left: in chair;with call bell/phone within reach;with family/visitor present  OT Visit Diagnosis: Other abnormalities of gait and mobility (R26.89);Muscle weakness (generalized) (M62.81)                Time: 2956-2130 OT Time Calculation (min): 18 min Charges:  OT  General Charges $OT Visit: 1 Visit OT Evaluation $OT Eval Moderate Complexity: 1 Mod OT Treatments $Self Care/Home Management : 8-22 mins  Kathie Dike, M.S. OTR/L  07/12/23, 10:02 AM  ascom [phone]

## 2023-07-12 NOTE — Plan of Care (Signed)
Pt transferred to [facility] with [facility] transportation. Vitals stable. Daughter [place] notified that pt has left.

## 2023-07-12 NOTE — Progress Notes (Signed)
Received patient in bed to unit.  Alert and oriented.  Informed consent signed and in chart.   TX duration: 3 hours  Patient tolerated well.  Transported back to the room  Alert, without acute distress.  Hand-off given to patient's nurse.   Access used: right femoral  Access issues: reversed lines  Total UF removed: 0 Medication(s) given: epo Post HD VS: BP BP 115/62 p 83 O2 Sat 95% RA temp 98.9 Post HD weight: 61.4kg  Freddie Breech, RN Kidney Dialysis Unit

## 2023-07-12 NOTE — Plan of Care (Signed)
  Problem: Education: Goal: Ability to describe self-care measures that may prevent or decrease complications (Diabetes Survival Skills Education) will improve Outcome: Progressing   Problem: Coping: Goal: Ability to adjust to condition or change in health will improve Outcome: Progressing   Problem: Fluid Volume: Goal: Ability to maintain a balanced intake and output will improve Outcome: Progressing   Problem: Health Behavior/Discharge Planning: Goal: Ability to identify and utilize available resources and services will improve Outcome: Progressing Goal: Ability to manage health-related needs will improve Outcome: Progressing   Problem: Metabolic: Goal: Ability to maintain appropriate glucose levels will improve Outcome: Progressing   Problem: Nutritional: Goal: Maintenance of adequate nutrition will improve Outcome: Progressing Goal: Progress toward achieving an optimal weight will improve Outcome: Progressing   Problem: Skin Integrity: Goal: Risk for impaired skin integrity will decrease Outcome: Progressing   Problem: Tissue Perfusion: Goal: Adequacy of tissue perfusion will improve Outcome: Progressing   Problem: Fluid Volume: Goal: Hemodynamic stability will improve Outcome: Progressing   Problem: Clinical Measurements: Goal: Diagnostic test results will improve Outcome: Progressing Goal: Signs and symptoms of infection will decrease Outcome: Progressing   Problem: Respiratory: Goal: Ability to maintain adequate ventilation will improve Outcome: Progressing   Problem: Education: Goal: Knowledge of General Education information will improve Description: Including pain rating scale, medication(s)/side effects and non-pharmacologic comfort measures Outcome: Progressing   Problem: Health Behavior/Discharge Planning: Goal: Ability to manage health-related needs will improve Outcome: Progressing   Problem: Clinical Measurements: Goal: Ability to maintain  clinical measurements within normal limits will improve Outcome: Progressing Goal: Will remain free from infection Outcome: Progressing Goal: Diagnostic test results will improve Outcome: Progressing Goal: Respiratory complications will improve Outcome: Progressing Goal: Cardiovascular complication will be avoided Outcome: Progressing   Problem: Activity: Goal: Risk for activity intolerance will decrease Outcome: Progressing   Problem: Nutrition: Goal: Adequate nutrition will be maintained Outcome: Progressing   Problem: Coping: Goal: Level of anxiety will decrease Outcome: Progressing   Problem: Elimination: Goal: Will not experience complications related to bowel motility Outcome: Progressing Goal: Will not experience complications related to urinary retention Outcome: Progressing   Problem: Pain Management: Goal: General experience of comfort will improve Outcome: Progressing   Problem: Safety: Goal: Ability to remain free from injury will improve Outcome: Progressing   Problem: Skin Integrity: Goal: Risk for impaired skin integrity will decrease Outcome: Progressing   Problem: Activity: Goal: Ability to tolerate increased activity will improve Outcome: Progressing   Problem: Respiratory: Goal: Ability to maintain a clear airway and adequate ventilation will improve Outcome: Progressing   Problem: Role Relationship: Goal: Method of communication will improve Outcome: Progressing

## 2023-07-12 NOTE — Progress Notes (Signed)
  Progress Note    07/12/2023 12:26 PM * No surgery found *  Subjective:  Christy Curry is a 68 yo female with a past medical history of peripheral artery disease, type 2 diabetes mellitus, diabetic neuropathy, GERD, hypertension, aortic stenosis, anemia of CKD, end-stage renal disease on HD via left AV Graft, MWF currently on the kidney transplant list at [facility] who presented to [facility] on 01/12 with shortness of breath. She has since recovered and currently has temporary dialysis access for hemodialysis. She also has a left upper extremity a/v graft that is currently not working.   On exam this morning the patient is sitting in a bedside chair eating breakfast. She has no complaints. Vitals all remain stable.    Vitals:   07/12/23 1100 07/12/23 1149  BP: (!) 146/55   Pulse:    Resp:  17  Temp:    SpO2:     Physical Exam: Cardiac:  RRR, no rubs clicks or gallops. No Murmurs appreciated.  Lungs: Clear on auscultation throughout but diminished in the bases.  No rales rhonchi or wheezing noted. Incisions: None Extremities: Palpable pulses throughout all extremities.  Left upper extremity with AV graft without thrill or palpable pulse. Abdomen: Positive bowel sounds throughout, soft, nontender and nondistended. Neurologic: Alert and oriented x 3, answers all questions and follows commands appropriately.  CBC    Component Value Date/Time   WBC 7.0 07/12/2023 0333   RBC 2.65 (L) 07/12/2023 0333   HGB 8.2 (L) 07/12/2023 0333   HCT 25.1 (L) 07/12/2023 0333   PLT 186 07/12/2023 0333   MCV 94.7 07/12/2023 0333   MCH 30.9 07/12/2023 0333   MCHC 32.7 07/12/2023 0333   RDW 14.4 07/12/2023 0333   LYMPHSABS 0.7 07/07/2023 0404   MONOABS 0.8 07/07/2023 0404   EOSABS 0.0 07/07/2023 0404   BASOSABS 0.0 07/07/2023 0404    BMET    Component Value Date/Time   NA 131 (L) 07/12/2023 0333   K 3.7 07/12/2023 0333   CL 94 (L) 07/12/2023 0333   CO2 24 07/12/2023 0333   GLUCOSE 128 (H)  07/12/2023 0333   BUN 57 (H) 07/12/2023 0333   CREATININE 6.89 (H) 07/12/2023 0333   CALCIUM 8.6 (L) 07/12/2023 0333   GFRNONAA 6 (L) 07/12/2023 0333    INR    Component Value Date/Time   INR 1.2 07/04/2023 1541     Intake/Output Summary (Last 24 hours) at 07/12/2023 1226 Last data filed at 07/11/2023 2200 Gross per 24 hour  Intake 220 ml  Output 50 ml  Net 170 ml     Assessment/Plan:  68 y.o. female recovering from RSV Pneumonia.  * No surgery found *   PLAN Vascular surgery plans on taking the patient to the vascular lab tomorrow on 07/13/2023 for left upper arm fistulogram and possible dialysis permacath placement if necessary.  I discussed in detail this morning with the patient and her family member at the bedside the procedure, benefits, risk, and complications.  She verbalizes her understanding.  She would like to proceed to soon as possible.  I answered all the patient's questions this morning.  Patient will be made n.p.o. after midnight tonight for procedure tomorrow.   DVT prophylaxis: ASA 81 mg daily, heparin 5000 units every 8 hours.   Marcie Bal [facility] 07/12/2023 12:26 PM

## 2023-07-12 NOTE — Progress Notes (Signed)
Speech Language Pathology Treatment: Dysphagia  Curry Details Name: Christy Curry MRN: [id number] DOB: 02-Jun-1956 Today's Date: 07/12/2023 Time: 4098-1191 SLP Time Calculation (min) (ACUTE ONLY): 40 min  Assessment / Plan / Recommendation Clinical Impression  Pt seen for ongoing assessment of swallowing and trials to upgrade diet. Pt stated she "wanted some" regular foods. She is currently tolerating a Dysphagia level 2(Minced) diet w/ Thin liquids w/out s/s of aspiration per NSG and Sister's reports. Pt is on [place] O2 2L; afebrile. WBC 7.0.   Pt is alert, verbally responsive and engaged in conversation w/ SLP; min distracted intermittently but redirected attention w/ verbal cue. Pt is d/t have HD today per NSG/chart.  Upper Denture placed for eating/chewing at meal. Pt is missing some lower Dentition baseline.   Pt explained general aspiration precautions and agreed verbally to the need for following them especially sitting upright for all oral intake and taking Small bites/sips Slowly. Supported pt behind the back more for full upright sitting. Pt given tray setup then fed self po trials including some of her Lunch meal of minced/chopped meats, soft solids, and thin liquids via straw. No overt clinical s/s of aspiration were noted w/ any consistency; respiratory status remained calm and unlabored, vocal quality clear b/t trials. O2 sats remained in upper 90s. Oral phase appeared grossly [facility] for bolus management and timely A-P transfer for swallowing; min increased mastication effort/time w/ the increased textured foods but no significant difference b/t the Minced meat and soft solid trials in regard to oral phase time. Oral clearing achieved w/ all consistencies.    Pt appears at reduced risk for aspiration/aspiration pneumonia when following general aspiration precautions and recommendations including support at meals d/t generalized weakness. Pt desires more variety of soft foods and  understands the need for easy to chew/eat, soft/cooked foods.  Recommend a Mech soft diet w/ Minced Meats/gravies added to moisten foods; Thin liquids. Recommend general aspiration precautions; tray setup and positioning support at meals. Upper Denture plate placed for meals. Pills Whole in Puree. REFLUX precautions d/t pt's baseline.  Education was discussed w/ pt and Sister present on: food textures and conservation of energy w/ the foods/textures -- choose easy to chew/eat foods from the Menu vs tougher to chew/eat foods; food options and prep; general aspiration precautions.  ST services will sign off at this time as pt appears at/near her swallowing Baseline w/ MD to reconsult if new needs while admitted. Recommend Dietician f/u. NSG updated. Sister agreed. Precautions posted at bedside, chart.         HPI HPI: Christy Curry with a past medical history of peripheral artery disease, type 2 diabetes mellitus, diabetic neuropathy, GERD, hypertension, aortic stenosis, anemia of CKD, end-stage renal disease on HD via left AV fistula MWF currently on the kidney transplant list at [facility] who presented to [facility] on 01/12 with shortness of breath. Initially on BiPAP with SpO2 of 100%. Over the course of the night her respiratory status got worse required intubation and mechanical ventilation (1/13) with bronchoscopy and airway clearance. She was extubated on 07/05/2022, though had multiple vomiting events throughout the day and night with significant aspiration event requiring reintubation on 07/05/2022. She was extubated 1/15. BiPAP utilized 1/15-1/17 preventing bedside swallow evaluation completion. Pt now on 2L O2. Pt curently tolerating a Dysphagia level 2 (minced foods) diet w/ Thin liquids.  CXR 1/16: Severe but improving multilobar bilateral pneumonia, as above. Small bilateral pleural effusions. Aortic atherosclerosis.  Pt has ongoing HD during  this admit.      SLP Plan  All goals met       Recommendations for follow up therapy are one component of a multi-disciplinary discharge planning process, led by the attending physician.  Recommendations may be updated based on Curry status, additional functional criteria and insurance authorization.    Recommendations  Diet recommendations: Dysphagia 3 (mechanical soft);Thin liquid (Meats MINCED) Liquids provided via: Cup;Straw (monitor) Medication Administration: Whole meds with puree (vs Whole in puree) Supervision: Curry able to self feed;Staff to assist with self feeding;Intermittent supervision to cue for compensatory strategies Compensations: Minimize environmental distractions;Slow rate;Small sips/bites;Lingual sweep for clearance of pocketing;Multiple dry swallows after each bite/sip Postural Changes and/or Swallow Maneuvers: Out of bed for meals;Seated upright 90 degrees;Upright 30-60 min after meal                  Oral care BID;Staff/trained caregiver to provide oral care (Denture care)   Intermittent Supervision/Assistance Dysphagia, unspecified (R13.10)     All goals met       Christy Som, MS, CCC-SLP Speech Language Pathologist Rehab Services; [facility] [place] [phone] (ascom) Christy Curry  07/12/2023, 3:50 PM

## 2023-07-12 NOTE — Progress Notes (Signed)
NAME:  Christy Curry, MRN:  [id number], DOB:  June 17, 1956, LOS: 8 ADMISSION DATE:  07/04/2023 History of Present Illness:    Case of 68 year old female patient with a past medical history of peripheral artery disease, type 2 diabetes mellitus, diabetic neuropathy, GERD, hypertension, aortic stenosis, anemia of CKD, end-stage renal disease on HD via left AV fistula MWF currently on the kidney transplant list at [facility] who presented to [facility] on 01/12 with shortness of breath.   History taken mostly from chart review.   She presents from home with weakness, shortness of breath, cough for couple of days.  Initially on BiPAP with SpO2 of 100%.  Hypertensive and tachycardic.  Labs with leukocytosis neutrophilic predominant 21.6.  H&H stable.  Electrolytes unremarkable.  Creatinine 7.7 g/dL and BUN 28.  Troponin was noted to be elevated initially at 100 and subsequently at 795.  Over the course of the night her respiratory status got worse required intubation and mechanical ventilation.   Chest x-ray with multifocal pneumonia.  Chest x-ray postintubation with possible mucous plugging of the right upper lobe.  RSV positive.    Pertinent  Medical History  -- Hypertension on Amlodipine, Carvedilol, Clonidine, losartan -- Type II DM Glipizide -- HLD on atorvastatin and ezetimibe -- GERD on PPI  -- ESRD on HD, [place]  Significant Hospital Events: Including procedures, antibiotic start and stop dates in addition to other pertinent events   07/05/2023 Intubation and MV, s/p bronchoscopy and airway clearance.  07/06/2023 Extubated but unfortunately had a multiple vomiting events through the day and night --> significant aspiration event requiring reintubation 07/06/2023 at night.  07/07/2023 Tolerated SBT well. This AM and overnight, she went into moderate respiratory distress. Requiring bipap support. Appears to be doing well on bipap. 07/08/2023 Patient with respiratory distress overnight,  hypertension and tachycardic. Unable to take PO meds. Started on Nitrodrip, IV metoprolol x1 and started on clonidine patch.  07/08/2022 Patient looking better today. Blood pressure improving. Remaining on Nitroglycerine. AV fistula clotted. Possible angiogram with vascular in the am.  1/20 off pressors, off oxygen, plan to transfer to [facility] per patient family   Interim History / Subjective:  Awake alert and following commands. On 2L [place]. Off Nitrodrip +AVG clot Plan for PERM cath and VASC surgery assessment Plan for HD today No pain  Objective   Blood pressure (!) 125/58, pulse 76, temperature 98.7 F (37.1 C), temperature source Oral, resp. rate 18, height 5\' 1"  (1.549 m), weight 61.4 kg, SpO2 93%.        Intake/Output Summary (Last 24 hours) at 07/12/2023 1117 Last data filed at 07/11/2023 2200 Gross per 24 hour  Intake 220 ml  Output 50 ml  Net 170 ml   Filed Weights   07/07/23 0330 07/08/23 0345 07/09/23 0500  Weight: 60.9 kg 61.4 kg 61.4 kg      Review of Systems: Gen:  Denies  fever, sweats, chills weight loss  HEENT: Denies blurred vision, double vision, ear pain, eye pain, hearing loss, nose bleeds, sore throat Cardiac:  No dizziness, chest pain or heaviness, chest tightness,edema, No JVD Resp:   No cough, -sputum production, -shortness of breath,-wheezing, -hemoptysis,  Other:  All other systems negative   Physical Examination:   General Appearance: No distress  EYES PERRLA, EOM intact.   NECK Supple, No JVD Pulmonary: normal breath sounds, No wheezing.  CardiovascularNormal S1,S2.  No m/r/g.   Abdomen: Benign, Soft, non-tender. Neurology UE/LE 5/5 strength, no focal deficits Ext pulses intact,  cap refill intact LEFT ARM no palpable thrill ALL OTHER ROS ARE NEGATIVE    Assessment & Plan:  68 year old female patient with a past medical history of peripheral artery disease, type 2 diabetes mellitus, diabetic neuropathy, GERD, hypertension, aortic stenosis,  anemia of CKD, end-stage renal disease on HD via left AV fistula MWF currently on the kidney transplant list at [facility] who presented to [facility] on 01/12 with shortness of breath.  Found to be in acute hypoxic respiratory failure requiring intubation and mechanical ventilation secondary to RSV pneumonia possible superimposed bacterial pneumonia and mucous plugging.  Severe ACUTE Hypoxic and Hypercapnic Respiratory Failure Resolved  RSV pneumonia Slowly resolving  AS on Kidney transplant list Plan to transfer to [facility] when bed available   AVG blood CLot Follow up vascular surgery   ESRD ON HD Plan for HD today -continue Foley Catheter-assess need -Avoid nephrotoxic agents -Follow urine output, BMP -Ensure adequate renal perfusion, optimize oxygenation -Renal dose medications   Intake/Output Summary (Last 24 hours) at 07/12/2023 1120 Last data filed at 07/11/2023 2200 Gross per 24 hour  Intake 220 ml  Output 50 ml  Net 170 ml     Best Practice (right click and "Reselect all SmartList Selections" daily)   Diet/type: Dysph 2 diet with 2L Fluid restriction DVT prophylaxis systemic heparin Pressure ulcer(s): N/A GI prophylaxis: PPI Lines: Dialysis Catheter Foley:  N/A Code Status:  full code  Last date of multidisciplinary goals of care discussion [07/11/2023]     DVT/GI PRX  assessed I Assessed the need for Labs I Assessed the need for Foley I Assessed the need for Central Venous Line Family Discussion when available I Assessed the need for Mobilization I made an Assessment of medications to be adjusted accordingly Safety Risk assessment completed  CASE DISCUSSED IN MULTIDISCIPLINARY ROUNDS WITH ICU TEAM  STEP DOWN STATUS AND CAN TRANSFER TO TRH   Shanon Seawright Santiago Glad, M.D.  [facility]  Medical Director [facility] Medical Director [facility] Cardio-Pulmonary Department

## 2023-07-12 NOTE — Evaluation (Addendum)
Physical Therapy Evaluation Patient Details Name: Christy Curry MRN: [id number] DOB: March 14, 1956 Today's Date: 07/12/2023  History of Present Illness  Pt is a 68 y/o F admitted on 07/04/23 after presenting with c/o SOB. Pt tested + for RSV, found to have multifocal PNA, required intubation, successful re-extubation 07/07/23. PMH: PAD, DM2, diabetic neuropathy, GERD, HTN, aortic stenosis, anemia of CKD, ESRD On HD MWF (currently on kidney transplant list)  Clinical Impression  Pt seen for PT evaluation with pt agreeable, MD cleared pt for ambulation (via secure chat) as pt eager to mobilize. Pt is able to complete STS with min assist with extra time & effort to push to standing, cuing re: hand placement. Pt ambulates into hallway with RW & CGA with trunk flexion, decreased step length RLE, pt c/o RLE weakness & stiffness. Pt would benefit from ongoing acute PT services to address strengthening, balance, endurance, to increase independence with mobility & reduce fall risk.   R temp fem cath without bleeding or issue at beginning of session.    If plan is discharge home, recommend the following: A little help with walking and/or transfers;A little help with bathing/dressing/bathroom;Assistance with cooking/housework;Assist for transportation;Help with stairs or ramp for entrance   Can travel by private vehicle   Yes    Equipment Recommendations Rolling walker (2 wheels);BSC/3in1  Recommendations for Other Services       Functional Status Assessment Patient has had a recent decline in their functional status and demonstrates the ability to make significant improvements in function in a reasonable and predictable amount of time.     Precautions / Restrictions Precautions Precautions: Fall Precaution Comments: R temp fem cath Restrictions Weight Bearing Restrictions Per Provider Order: No      Mobility  Bed Mobility               General bed mobility comments: not tested,  pt received & left sitting in recliner    Transfers Overall transfer level: Needs assistance Equipment used: Rolling walker (2 wheels) Transfers: Sit to/from Stand Sit to Stand: Min assist           General transfer comment: ~3 attempts prior to successful STS, cuing re: hand placement to push to standing    Ambulation/Gait Ambulation/Gait assistance: Contact guard assist Gait Distance (Feet): 40 Feet Assistive device: Rolling walker (2 wheels) Gait Pattern/deviations: Decreased step length - right, Decreased step length - left, Decreased stride length, Trunk flexed Gait velocity: decreased     General Gait Details: pushes RW slightly out in front of her  Stairs            Wheelchair Mobility     Tilt Bed    Modified Rankin (Stroke Patients Only)       Balance Overall balance assessment: Needs assistance Sitting-balance support: Feet supported Sitting balance-Leahy Scale: Good     Standing balance support: During functional activity, Bilateral upper extremity supported, Reliant on assistive device for balance Standing balance-Leahy Scale: Fair                               Pertinent Vitals/Pain Pain Assessment Pain Assessment: No/denies pain    Home Living Family/patient expects to be discharged to:: Private residence Living Arrangements: Alone Available Help at Discharge: Family;Available 24 hours/day Type of Home: House Home Access: Stairs to enter Entrance Stairs-Rails: None Entrance Stairs-Number of Steps: 2   Home Layout: Two level;Able to live on main level with bedroom/bathroom  Home Equipment: Rollator (4 wheels)      Prior Function Prior Level of Function : Independent/Modified Independent             Mobility Comments: mod I with rollator       Extremity/Trunk Assessment   Upper Extremity Assessment Upper Extremity Assessment: Overall WFL for tasks assessed    Lower Extremity Assessment Lower Extremity  Assessment: Overall WFL for tasks assessed;Generalized weakness (c/o RLE feeling weak & stiff)       Communication   Communication Communication: No apparent difficulties  Cognition Arousal: Alert Behavior During Therapy: WFL for tasks assessed/performed Overall Cognitive Status: Within Functional Limits for tasks assessed                                 General Comments: motivated to mobilize        General Comments General comments (skin integrity, edema, etc.): Pt on room air, SpO2 >/= 90%, no c/o SOB.    Exercises     Assessment/Plan    PT Assessment Patient needs continued PT services  PT Problem List Decreased strength;Decreased activity tolerance;Decreased balance;Decreased mobility;Decreased knowledge of use of DME;Cardiopulmonary status limiting activity       PT Treatment Interventions DME instruction;Balance training;Modalities;Gait training;Neuromuscular re-education;Stair training;Functional mobility training;Therapeutic activities;Therapeutic exercise;Manual techniques;Patient/family education    PT Goals (Current goals can be found in the Care Plan section)  Acute Rehab PT Goals Patient Stated Goal: continue getting better PT Goal Formulation: With patient Time For Goal Achievement: 07/26/23 Potential to Achieve Goals: Good    Frequency Min 1X/week     Co-evaluation               AM-PAC PT "6 Clicks" Mobility  Outcome Measure Help needed turning from your back to your side while in a flat bed without using bedrails?: None Help needed moving from lying on your back to sitting on the side of a flat bed without using bedrails?: A Little Help needed moving to and from a bed to a chair (including a wheelchair)?: A Little Help needed standing up from a chair using your arms (e.g., wheelchair or bedside chair)?: A Little Help needed to walk in hospital room?: A Little Help needed climbing 3-5 steps with a railing? : A Little 6 Click  Score: 19    End of Session   Activity Tolerance: Patient tolerated treatment well Patient left: in chair;with call bell/phone within reach;with family/visitor present Nurse Communication: Mobility status PT Visit Diagnosis: Muscle weakness (generalized) (M62.81);Other abnormalities of gait and mobility (R26.89);Difficulty in walking, not elsewhere classified (R26.2)    Time: 5638-7564 PT Time Calculation (min) (ACUTE ONLY): 12 min   Charges:   PT Evaluation $PT Eval Moderate Complexity: 1 Mod   PT General Charges $$ ACUTE PT VISIT: 1 Visit         Aleda Grana, PT, DPT 07/12/23, 9:43 AM   Sandi Mariscal 07/12/2023, 9:39 AM

## 2023-07-12 NOTE — Progress Notes (Signed)
[facility]  ROUNDING NOTE   Subjective:   Patient resting in bed this a.m. Due for hemodialysis treatment today. Physical therapy to work with patient however patient still has femoral dialysis catheter in place.   Objective:  Vital signs in last 24 hours:  Temp:  [98.4 F (36.9 C)-99 F (37.2 C)] 98.7 F (37.1 C) (01/20 0800) Pulse Rate:  [65-81] 69 (01/20 0801) Resp:  [12-27] 15 (01/20 0801) BP: (83-179)/(39-86) 129/64 (01/20 0800) SpO2:  [87 %-100 %] 99 % (01/20 0801)  Weight change:  Filed Weights   07/07/23 0330 07/08/23 0345 07/09/23 0500  Weight: 60.9 kg 61.4 kg 61.4 kg    Intake/Output: I/O last 3 completed shifts: In: 291.3 [P.O.:120; I.V.:0.5; IV Piggyback:170.8] Out: 200 [Urine:200]   Intake/Output this shift:  No intake/output data recorded.  Physical Exam: General: No acute distress  Head: Normocephalic, atraumatic.  Hearing intact  Neck: Supple  Lungs:  Scattered rhonchi, normal effort  Heart: S1S2 no rubs  Abdomen:  Soft, nontender, bowel sounds present  Extremities: Trace peripheral edema.  Neurologic: Awake, alert  Skin: No acute rash  Access: Left upper extremity AV graft, right femoral temporary dialysis catheter    Basic Metabolic Panel: Recent Labs  Lab 07/07/23 0404 07/08/23 0502 07/09/23 0524 07/10/23 0502 07/11/23 0414 07/12/23 0333  NA 136 134* 134* 133* 131* 131*  K 4.1 4.3 4.2 3.7 3.4* 3.7  CL 99 97* 95* 95* 94* 94*  CO2 26 24 21* 25 24 24   GLUCOSE 134* 139* 143* 148* 151* 128*  BUN 36* 27* 24* 29* 46* 57*  CREATININE 5.86* 4.01* 3.79* 3.67* 5.30* 6.89*  CALCIUM 8.0* 8.8* 8.6* 9.0 8.5* 8.6*  MG 2.2 2.1 2.2  --  2.3 2.4  PHOS 6.3* 5.6* 5.1* 4.9* 4.6 5.4*    Liver Function Tests: Recent Labs  Lab 07/07/23 0404 07/08/23 0502 07/08/23 1112 07/09/23 0524 07/10/23 0502 07/11/23 0414 07/12/23 0333  AST 15  --  27  --   --   --   --   ALT 11  --  12  --   --   --   --   ALKPHOS 53  --  50  --   --   --    --   BILITOT 0.6  --  0.8  --   --   --   --   PROT 5.5*  --  5.8*  --   --   --   --   ALBUMIN 2.6*  2.6*   < > 2.8* 2.8* 2.6* 2.4* 2.6*   < > = values in this interval not displayed.   Recent Labs  Lab 07/08/23 1112  LIPASE 19   No results for input(s): "AMMONIA" in the last 168 hours.  CBC: Recent Labs  Lab 07/06/23 0403 07/06/23 1302 07/07/23 0404 07/08/23 0502 07/09/23 0524 07/10/23 0502 07/11/23 0414 07/12/23 0333  WBC 13.2*   < > 11.9* 12.3* 6.3 4.9 6.3 7.0  NEUTROABS 11.6*  --  10.3*  --   --   --   --   --   HGB 7.9*   < > 7.9* 8.9* 7.8* 7.1* 7.5* 8.2*  HCT 24.5*   < > 24.7* 29.0* 24.6* 21.7* 23.2* 25.1*  MCV 97.2   < > 96.9 99.3 95.7 94.3 95.9 94.7  PLT 204   < > 220 275 202 179 175 186   < > = values in this interval not displayed.    Cardiac Enzymes:  No results for input(s): "CKTOTAL", "CKMB", "CKMBINDEX", "TROPONINI" in the last 168 hours.  BNP: Invalid input(s): "POCBNP"  CBG: Recent Labs  Lab 07/11/23 1607 07/11/23 1943 07/11/23 2309 07/12/23 0323 07/12/23 0739  GLUCAP 189* 191* 154* 141* 106*    Microbiology: Results for orders placed or performed during the hospital encounter of 07/04/23  Resp panel by RT-PCR (RSV, Flu A&B, Covid) Anterior Nasal Swab     Status: Abnormal   Collection Time: 07/04/23  1:48 PM   Specimen: Anterior Nasal Swab  Result Value Ref Range Status   SARS Coronavirus 2 by RT PCR NEGATIVE NEGATIVE Final    Comment: (NOTE) SARS-CoV-2 target nucleic acids are NOT DETECTED.  The SARS-CoV-2 RNA is generally detectable in upper respiratory specimens during the acute phase of infection. The lowest concentration of SARS-CoV-2 viral copies this assay can detect is 138 copies/mL. A negative result does not preclude SARS-Cov-2 infection and should not be used as the sole basis for treatment or other patient management decisions. A negative result may occur with  improper specimen collection/handling, submission of specimen  other than nasopharyngeal swab, presence of viral mutation(s) within the areas targeted by this assay, and inadequate number of viral copies(<138 copies/mL). A negative result must be combined with clinical observations, patient history, and epidemiological information. The expected result is Negative.  Fact Sheet for Patients:  BloggerCourse.com  Fact Sheet for Healthcare Providers:  SeriousBroker.it  This test is no t yet approved or cleared by the [place] FDA and  has been authorized for detection and/or diagnosis of SARS-CoV-2 by FDA under an Emergency Use Authorization (EUA). This EUA will remain  in effect (meaning this test can be used) for the duration of the COVID-19 declaration under Section 564(b)(1) of the Act, 21 U.S.C.section 360bbb-3(b)(1), unless the authorization is terminated  or revoked sooner.       Influenza A by PCR NEGATIVE NEGATIVE Final   Influenza B by PCR NEGATIVE NEGATIVE Final    Comment: (NOTE) The Xpert Xpress SARS-CoV-2/FLU/RSV plus assay is intended as an aid in the diagnosis of influenza from Nasopharyngeal swab specimens and should not be used as a sole basis for treatment. Nasal washings and aspirates are unacceptable for Xpert Xpress SARS-CoV-2/FLU/RSV testing.  Fact Sheet for Patients: BloggerCourse.com  Fact Sheet for Healthcare Providers: SeriousBroker.it  This test is not yet approved or cleared by the [place] FDA and has been authorized for detection and/or diagnosis of SARS-CoV-2 by FDA under an Emergency Use Authorization (EUA). This EUA will remain in effect (meaning this test can be used) for the duration of the COVID-19 declaration under Section 564(b)(1) of the Act, 21 U.S.C. section 360bbb-3(b)(1), unless the authorization is terminated or revoked.     Resp Syncytial Virus by PCR POSITIVE (A) NEGATIVE Final     Comment: (NOTE) Fact Sheet for Patients: BloggerCourse.com  Fact Sheet for Healthcare Providers: SeriousBroker.it  This test is not yet approved or cleared by the [place] FDA and has been authorized for detection and/or diagnosis of SARS-CoV-2 by FDA under an Emergency Use Authorization (EUA). This EUA will remain in effect (meaning this test can be used) for the duration of the COVID-19 declaration under Section 564(b)(1) of the Act, 21 U.S.C. section 360bbb-3(b)(1), unless the authorization is terminated or revoked.  Performed at [facility] [address] [place], [place] [address]   Blood Culture (routine x 2)     Status: None   Collection Time: 07/04/23  3:41 PM  Specimen: BLOOD  Result Value Ref Range Status   Specimen Description BLOOD BLOOD RIGHT WRIST  Final   Special Requests   Final    BOTTLES DRAWN AEROBIC AND ANAEROBIC Blood Culture adequate volume   Culture   Final    NO GROWTH 5 DAYS Performed at [facility], [address], [place], [place] [address]    Report Status 07/09/2023 FINAL  Final  Blood Culture (routine x 2)     Status: None   Collection Time: 07/04/23  3:41 PM   Specimen: BLOOD  Result Value Ref Range Status   Specimen Description BLOOD BLOOD RIGHT HAND  Final   Special Requests   Final    BOTTLES DRAWN AEROBIC AND ANAEROBIC Blood Culture adequate volume   Culture   Final    NO GROWTH 5 DAYS Performed at [facility], [address], [place], [place] [address]    Report Status 07/09/2023 FINAL  Final  MRSA Next Gen by PCR, Nasal     Status: None   Collection Time: 07/04/23  6:20 PM   Specimen: Nasal Mucosa; Nasal Swab  Result Value Ref Range Status   MRSA by PCR Next Gen NOT DETECTED NOT DETECTED Final    Comment: (NOTE) The GeneXpert MRSA Assay (FDA approved for NASAL specimens only), is one component of a comprehensive MRSA colonization  surveillance program. It is not intended to diagnose MRSA infection nor to guide or monitor treatment for MRSA infections. Test performance is not FDA approved in patients less than 59 years old. Performed at [facility], [address] [place], [place] [address]   Culture, Respiratory w Gram Stain     Status: None   Collection Time: 07/05/23  8:07 AM   Specimen: Tracheal Aspirate; Respiratory  Result Value Ref Range Status   Specimen Description   Final    TRACHEAL ASPIRATE Performed at [facility], [address] [place], [place] [address]    Special Requests   Final    NONE Performed at [facility], [address] [place], [place] [address]    Gram Stain   Final    MODERATE WBC PRESENT, PREDOMINANTLY PMN RARE GRAM POSITIVE COCCI IN PAIRS    Culture   Final    NO GROWTH 2 DAYS Performed at [facility], [address] [place], [place] [address]    Report Status 07/08/2023 FINAL  Final  Culture, Respiratory w Gram Stain     Status: None   Collection Time: 07/05/23  3:26 PM   Specimen: Bronchoalveolar Lavage; Respiratory  Result Value Ref Range Status   Specimen Description   Final    BRONCHIAL ALVEOLAR LAVAGE Performed at [facility], [address], [place], [place] [address]    Special Requests   Final    NONE Performed at [facility], [address] [place], [place] [address]    Gram Stain   Final    FEW WBC PRESENT, PREDOMINANTLY PMN NO ORGANISMS SEEN    Culture   Final    RARE Normal respiratory flora-no Staph aureus or Pseudomonas seen Performed at [facility], [address] [place], [place] [address]    Report Status 07/08/2023 FINAL  Final    Coagulation Studies: No results for input(s): "LABPROT", "INR" in the last 72 hours.   Urinalysis: No results for input(s): "COLORURINE", "LABSPEC", "PHURINE", "GLUCOSEU", "HGBUR", "BILIRUBINUR", "KETONESUR", "PROTEINUR", "UROBILINOGEN",  "NITRITE", "LEUKOCYTESUR" in the last 72 hours.  Invalid  input(s): "APPERANCEUR"     Imaging: No results found.    Medications:    promethazine (PHENERGAN) injection (IM or IVPB) Stopped (07/08/23 0339)    aspirin EC  81 mg Oral Daily   atorvastatin  80 mg Oral QHS   carvedilol  25 mg Oral BID WC   Chlorhexidine Gluconate Cloth  6 each Topical Q0600   cloNIDine  0.2 mg Oral BID   ezetimibe  10 mg Oral Daily   feeding supplement (NEPRO CARB STEADY)  237 mL Oral BID   heparin injection (subcutaneous)  5,000 Units Subcutaneous Q8H   insulin aspart  0-6 Units Subcutaneous Q4H   ipratropium-albuterol  3 mL Nebulization TID   losartan  100 mg Oral Daily   multivitamin  1 tablet Oral QHS   pantoprazole (PROTONIX) IV  40 mg Intravenous Daily   predniSONE  30 mg Oral Q breakfast   Followed by   Melene Muller ON 07/13/2023] predniSONE  20 mg Oral Q breakfast   Followed by   Melene Muller ON 07/16/2023] predniSONE  10 mg Oral Q breakfast   sevelamer carbonate  0.8 g Oral TID WC   acetaminophen, alteplase, bisacodyl, docusate sodium, hydrALAZINE, ipratropium-albuterol, labetalol, menthol-cetylpyridinium, morphine injection, ondansetron (ZOFRAN) IV, mouth rinse, mouth rinse, polyethylene glycol, promethazine (PHENERGAN) injection (IM or IVPB), traZODone  Assessment/ Plan:  68 y.o. female with past medical history of diabetes mellitus type 2, peripheral neuropathy, GERD, hypertension, aortic stenosis, anemia of chronic kidney disease, peripheral arterial disease, ESRD on HD MWF who presented with shortness of breath and found to have RSV infection.  [facility]/MWF/[facility] [address]/left upper extremity AV graft  1.  ESRD on HD MWF.  Patient due for dialysis treatment today.  Consult with vascular surgery to determine timing of possible declot of left upper extremity AV graft versus PermCath placement.  2.  Acute respiratory failure secondary to RSV infection.  Extubated 07/07/2023.  Currently on 2 L and  breathing comfortably.  3.  Anemia of chronic kidney disease.  Lab Results  Component Value Date   HGB 8.2 (L) 07/12/2023   Start Epogen 10,000 units IV with dialysis.  4.  Secondary hyperparathyroidism.  Maintain Renvela and monitor serum phosphorus.   LOS: 8 Christy Curry 1/20/20258:31 AM

## 2023-07-12 NOTE — Progress Notes (Signed)
Rounding Note    Patient Name: Christy Curry Date of Encounter: 07/12/2023  [facility] Cardiologist: [facility]  Subjective   Awaiting open bed at [facility] for transfer. Patient is off the nitroglycerin drip. She denies further chest pain. Plan for HD today.   Inpatient Medications    Scheduled Meds:  aspirin EC  81 mg Oral Daily   atorvastatin  80 mg Oral QHS   carvedilol  25 mg Oral BID WC   Chlorhexidine Gluconate Cloth  6 each Topical Q0600   cloNIDine  0.2 mg Oral BID   epoetin alfa-epbx (RETACRIT) injection  10,000 Units Subcutaneous Q M,W,F-HD   ezetimibe  10 mg Oral Daily   feeding supplement (NEPRO CARB STEADY)  237 mL Oral BID   heparin injection (subcutaneous)  5,000 Units Subcutaneous Q8H   insulin aspart  0-6 Units Subcutaneous Q4H   ipratropium-albuterol  3 mL Nebulization TID   losartan  100 mg Oral Daily   multivitamin  1 tablet Oral QHS   pantoprazole (PROTONIX) IV  40 mg Intravenous Daily   [START ON 07/13/2023] predniSONE  20 mg Oral Q breakfast   Followed by   Melene Muller ON 07/16/2023] predniSONE  10 mg Oral Q breakfast   sevelamer carbonate  0.8 g Oral TID WC   Continuous Infusions:  promethazine (PHENERGAN) injection (IM or IVPB) Stopped (07/08/23 0339)   PRN Meds: acetaminophen, alteplase, bisacodyl, docusate sodium, hydrALAZINE, ipratropium-albuterol, labetalol, menthol-cetylpyridinium, morphine injection, ondansetron (ZOFRAN) IV, mouth rinse, mouth rinse, polyethylene glycol, promethazine (PHENERGAN) injection (IM or IVPB), traZODone   Vital Signs    Vitals:   07/12/23 1000 07/12/23 1002 07/12/23 1100 07/12/23 1149  BP: (!) 125/58 (!) 125/58 (!) 146/55   Pulse: 76     Resp: 18   17  Temp:      TempSrc:      SpO2: 93%     Weight:      Height:        Intake/Output Summary (Last 24 hours) at 07/12/2023 1239 Last data filed at 07/11/2023 2200 Gross per 24 hour  Intake 220 ml  Output 50 ml  Net 170 ml      07/09/2023    5:00 AM  07/08/2023    3:45 AM 07/07/2023    3:30 AM  Last 3 Weights  Weight (lbs) 135 lb 5.8 oz 135 lb 5.8 oz 134 lb 4.2 oz  Weight (kg) 61.4 kg 61.4 kg 60.9 kg      Telemetry    Nsr HR 60s - Personally Reviewed  ECG    No new - Personally Reviewed  Physical Exam   GEN: No acute distress.   Neck: No JVD Cardiac: RRR, + murmur, no rubs, or gallops.  Respiratory: Clear to auscultation bilaterally. GI: Soft, nontender, non-distended  MS: No edema; No deformity. Neuro:  Nonfocal  Psych: Normal affect   Labs    High Sensitivity Troponin:   Recent Labs  Lab 07/05/23 1422 07/08/23 1112 07/08/23 1337 07/08/23 1752 07/08/23 1947  TROPONINIHS 553* 1,575* 2,798* 5,054* 3,079*     Chemistry Recent Labs  Lab 07/07/23 0404 07/08/23 0502 07/08/23 1112 07/09/23 0524 07/10/23 0502 07/11/23 0414 07/12/23 0333  NA 136   < >  --  134* 133* 131* 131*  K 4.1   < >  --  4.2 3.7 3.4* 3.7  CL 99   < >  --  95* 95* 94* 94*  CO2 26   < >  --  21* 25  24 24  GLUCOSE 134*   < >  --  143* 148* 151* 128*  BUN 36*   < >  --  24* 29* 46* 57*  CREATININE 5.86*   < >  --  3.79* 3.67* 5.30* 6.89*  CALCIUM 8.0*   < >  --  8.6* 9.0 8.5* 8.6*  MG 2.2   < >  --  2.2  --  2.3 2.4  PROT 5.5*  --  5.8*  --   --   --   --   ALBUMIN 2.6*  2.6*   < > 2.8* 2.8* 2.6* 2.4* 2.6*  AST 15  --  27  --   --   --   --   ALT 11  --  12  --   --   --   --   ALKPHOS 53  --  50  --   --   --   --   BILITOT 0.6  --  0.8  --   --   --   --   GFRNONAA 7*   < >  --  12* 13* 8* 6*  ANIONGAP 11   < >  --  18* 13 13 13    < > = values in this interval not displayed.    Lipids  Recent Labs  Lab 07/07/23 0404  TRIG 91    Hematology Recent Labs  Lab 07/10/23 0502 07/11/23 0414 07/12/23 0333  WBC 4.9 6.3 7.0  RBC 2.30* 2.42* 2.65*  HGB 7.1* 7.5* 8.2*  HCT 21.7* 23.2* 25.1*  MCV 94.3 95.9 94.7  MCH 30.9 31.0 30.9  MCHC 32.7 32.3 32.7  RDW 14.2 14.3 14.4  PLT 179 175 186   Thyroid No results for input(s):  "TSH", "FREET4" in the last 168 hours.  BNPNo results for input(s): "BNP", "PROBNP" in the last 168 hours.  DDimer No results for input(s): "DDIMER" in the last 168 hours.   Radiology    No results found.  Cardiac Studies   2D echo 07/06/2023: 1. Left ventricular ejection fraction, by estimation, is 50 to 55%. The  left ventricle has low normal function. Left ventricular endocardial  border not optimally defined to evaluate regional wall motion. There is  mild left ventricular hypertrophy. Left  ventricular diastolic parameters are consistent with Grade II diastolic  dysfunction (pseudonormalization). Elevated left atrial pressure.   2. Right ventricular systolic function is normal. The right ventricular  size is normal. There is normal pulmonary artery systolic pressure.   3. The mitral valve is degenerative. Mild to moderate mitral valve  regurgitation. Moderate mitral annular calcification.   4. Tricuspid valve regurgitation is mild to moderate.   5. The aortic valve has an indeterminant number of cusps. There is severe  calcifcation of the aortic valve. There is moderate thickening of the  aortic valve. Aortic valve regurgitation is trivial. Severe aortic valve  stenosis. Aortic valve area, by VTI  measures 0.70 cm. Aortic valve mean gradient measures 47.0 mmHg.   6. The inferior vena cava is normal in size with <50% respiratory  variability, suggesting right atrial pressure of 8 mmHg.    Patient Profile     67 y.o. 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, HTN, HLD, anemia of chronic disease, thyroid nodule, OSA, and chronic back pain who was  admitted on 07/04/2023 with acute hypoxic respiratory failure requiring mechanical ventilation in the setting of RSV pneumonia with  possible superimposed bacterial pneumonia and mucous plugging s/p extubation on 07/05/2022 with course complicated by aspiration pneumonia requiring reintubation on 07/05/2022, now extubated on 07/07/2023 and is being seen today for the evaluation of severe aortic stenosis and elevated troponin   Assessment & Plan    Acute hypoxic respiratory failure - she was admitted 07/04/23 with acute hypoxic respiratory failure requiring mechanical ventilation in the setting of RSV PNA with possible superimposed bacterial {NA and mucous plugging s/p extubation 07/06/23 with course complicated by aspiration PNA requiring re-intubation 1/14 and extubated 1/15. - per CCM  Severe aortic stenosis - echo this admission showed severe AS with a mean gradient of and a valve area of 0.71cm2 - this is progression from prior echo 02/2023 at [facility], showing mean gradient and a valve area of 1.0 cm2 - contibuting to breathing issues - plan for further valve work-up at [facility]  CAD with NSTEMI - no further chest pain - elevated troponin, felt to be from supply demand mismatch 2/2 severe acute pulmonary illness, severe Aortic stenosis, anemia of chronic disease and ESRD. HS trop elevated to 5054, but no chest pain reported - was on IV nitroglycerin for BP>now off - plan for heart cath at [facility] as above - continue ASA, Lipitor and oreg  HTN - BP labile - continue current meds (held this AM for HD)  For questions or updates, please contact [facility] Please consult [email or web address] for contact info under        Signed, Maggie Dworkin David Stall, PA-C  07/12/2023, 12:39 PM

## 2023-07-12 NOTE — Plan of Care (Signed)
To whom it may concern,  This letter is to verify that family members of Christy Curry have been visiting the patient throughout the day 07/12/2023 while in the hospital(Intensive care unit)   Lucie Leather, M.D.  [facility]  Medical Director [facility] Medical Director [facility] Cardio-Pulmonary Department

## 2023-07-13 ENCOUNTER — Ambulatory Visit: Admit: 2023-07-13 | Admitting: Vascular Surgery

## 2023-07-13 SURGERY — DIALYSIS/PERMA CATHETER INSERTION
Anesthesia: Moderate Sedation

## 2023-07-14 ENCOUNTER — Encounter (HOSPITAL_COMMUNITY): Payer: Self-pay

## 2023-07-14 ENCOUNTER — Inpatient Hospital Stay: Admit: 2023-07-14 | Admitting: Internal Medicine

## 2023-07-21 ENCOUNTER — Other Ambulatory Visit: Payer: Self-pay | Admitting: Cardiovascular Disease

## 2023-10-13 ENCOUNTER — Other Ambulatory Visit
Admission: RE | Admit: 2023-10-13 | Discharge: 2023-10-13 | Disposition: A | Discharge status: Home / Self Care | Source: Ambulatory Visit | Attending: Nephrology | Admitting: Nephrology

## 2023-10-13 DIAGNOSIS — N186 End stage renal disease: Secondary | ICD-10-CM | POA: Insufficient documentation

## 2023-10-19 LAB — HEPATITIS B SURFACE ANTIGEN

## 2023-10-30 IMAGING — US US ABDOMEN LIMITED
1 series · 5 of 5 positions shown · non-contrast
Comparison: None Available.

CLINICAL DATA: Evaluate for ascites

EXAM:
LIMITED ABDOMEN ULTRASOUND FOR ASCITES
TECHNIQUE: Limited ultrasound survey for ascites was performed in all four
abdominal quadrants.

[Series 1: us abdomen limited · 0.26mm/px · 5 of 5 slices shown]
[im 1/5]
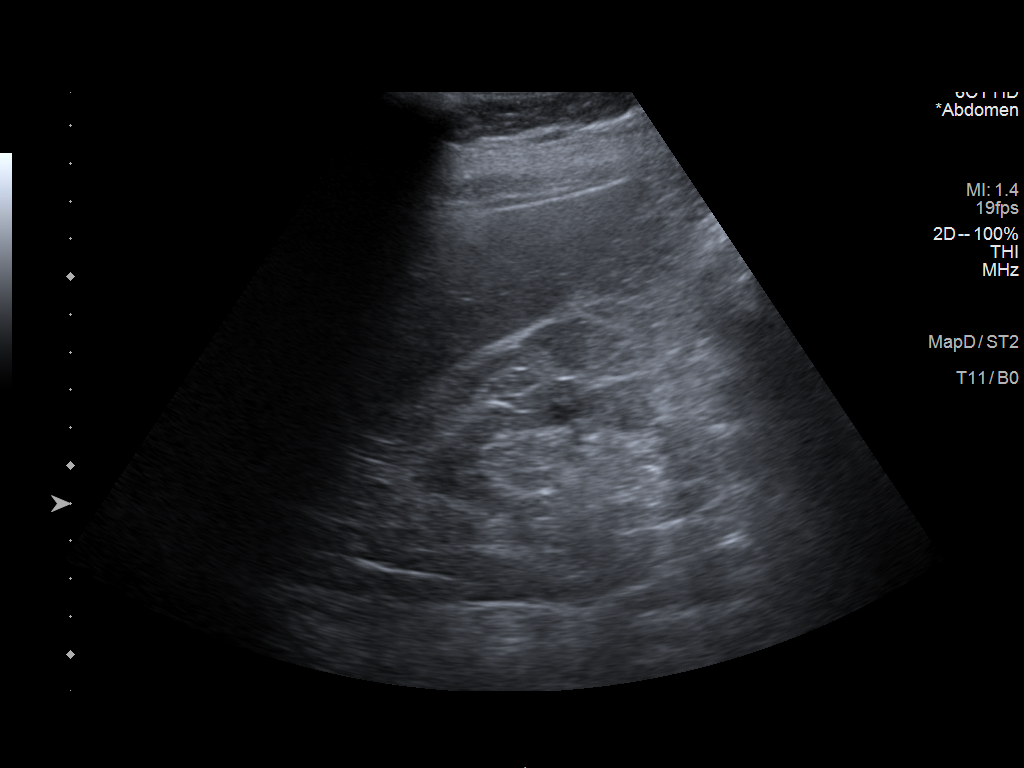
[im 2/5]
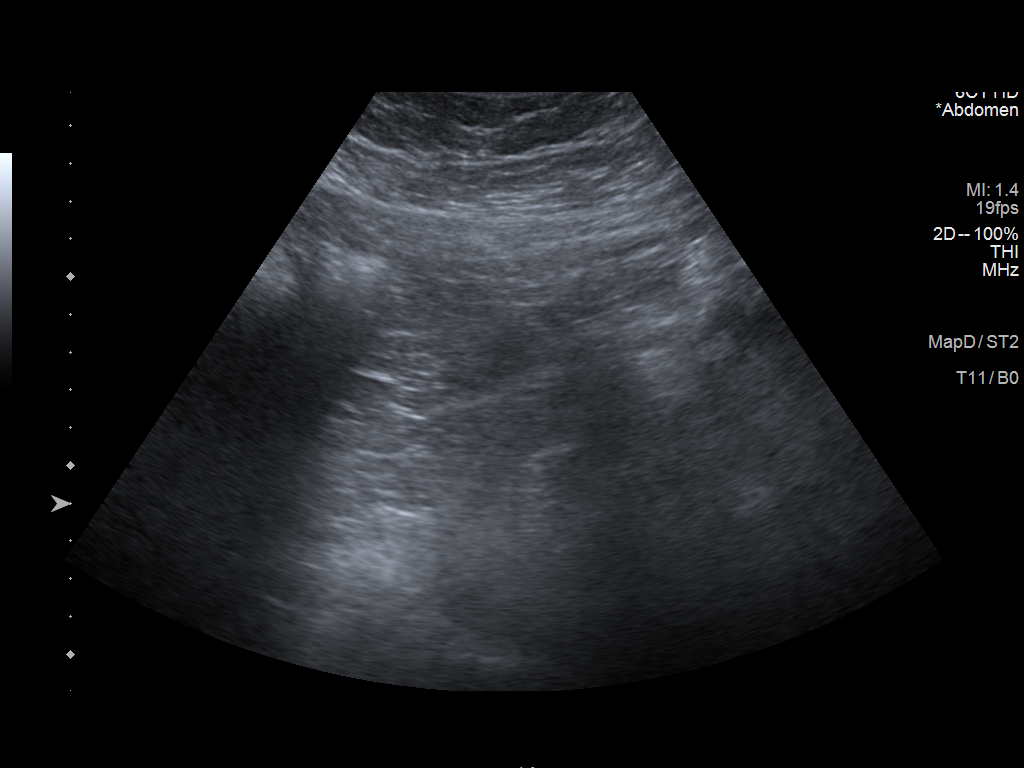
[im 3/5]
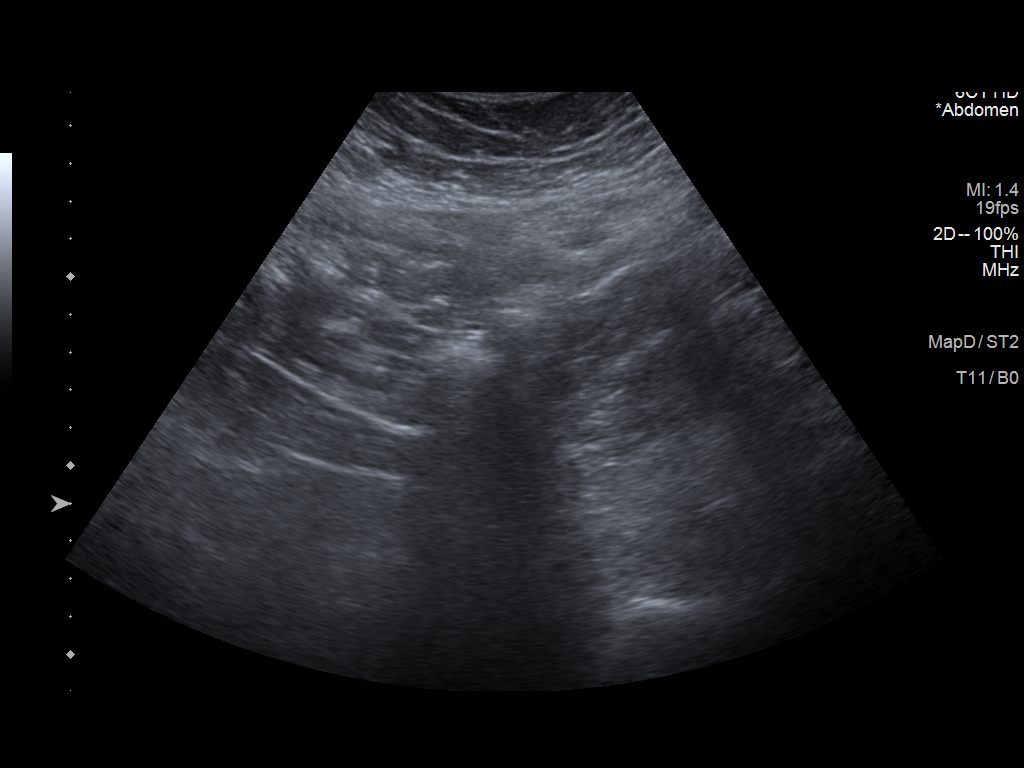
[im 4/5]
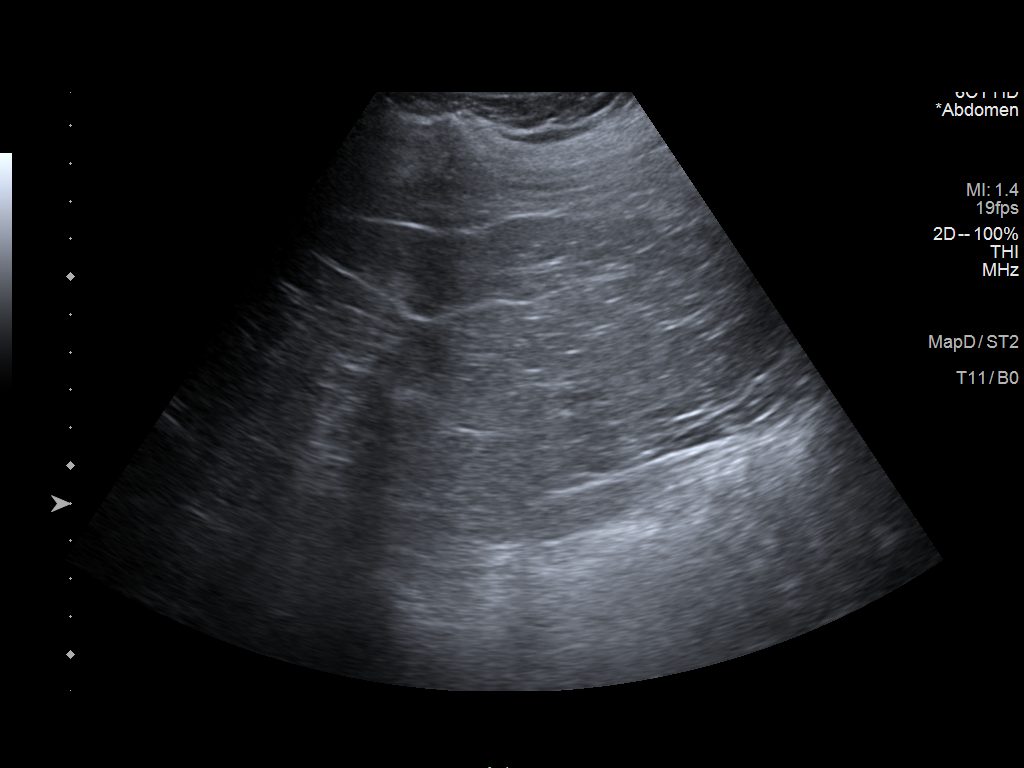
[im 5/5]
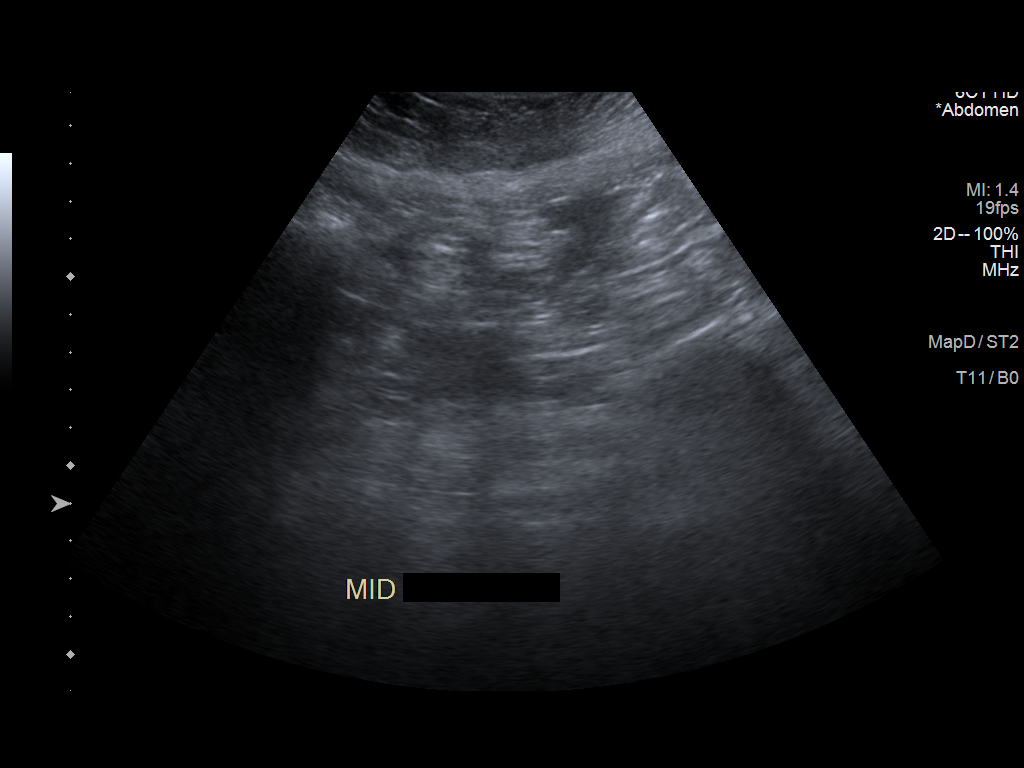

[5 of 5 positions shown; findings below may reference images not displayed]

FINDINGS: No ascites visualized.
IMPRESSION: As above.

## 2023-11-02 ENCOUNTER — Telehealth: Payer: Self-pay | Admitting: Cardiovascular Disease

## 2023-11-02 NOTE — Telephone Encounter (Signed)
-----   Message from Nurse Aurora Blowers sent at 11/01/2023  3:42 PM EDT ----- This patient needs her echo scheduled for September please.

## 2023-11-02 NOTE — Telephone Encounter (Signed)
 Pt needs echo scheduled in Sept.

## 2023-11-12 NOTE — Telephone Encounter (Signed)
 Left voicemail, pt needs echo scheduled and follow up appt scheduled from recall.

## 2023-12-31 ENCOUNTER — Other Ambulatory Visit: Payer: Self-pay | Admitting: Cardiovascular Disease

## 2024-01-14 ENCOUNTER — Other Ambulatory Visit: Payer: Self-pay

## 2024-01-14 ENCOUNTER — Inpatient Hospital Stay
Admission: EM | Admit: 2024-01-14 | Discharge: 2024-01-18 | DRG: 871 | Disposition: A | Discharge status: Home / Self Care | Attending: Internal Medicine | Admitting: Internal Medicine

## 2024-01-14 ENCOUNTER — Emergency Department

## 2024-01-14 DIAGNOSIS — Z79899 Other long term (current) drug therapy: Secondary | ICD-10-CM

## 2024-01-14 DIAGNOSIS — Z7982 Long term (current) use of aspirin: Secondary | ICD-10-CM | POA: Diagnosis not present

## 2024-01-14 DIAGNOSIS — Z952 Presence of prosthetic heart valve: Secondary | ICD-10-CM | POA: Diagnosis not present

## 2024-01-14 DIAGNOSIS — I350 Nonrheumatic aortic (valve) stenosis: Secondary | ICD-10-CM | POA: Diagnosis present

## 2024-01-14 DIAGNOSIS — I2510 Atherosclerotic heart disease of native coronary artery without angina pectoris: Secondary | ICD-10-CM | POA: Diagnosis present

## 2024-01-14 DIAGNOSIS — R55 Syncope and collapse: Secondary | ICD-10-CM | POA: Diagnosis not present

## 2024-01-14 DIAGNOSIS — E11319 Type 2 diabetes mellitus with unspecified diabetic retinopathy without macular edema: Secondary | ICD-10-CM | POA: Diagnosis present

## 2024-01-14 DIAGNOSIS — Z794 Long term (current) use of insulin: Secondary | ICD-10-CM | POA: Diagnosis not present

## 2024-01-14 DIAGNOSIS — K810 Acute cholecystitis: Secondary | ICD-10-CM | POA: Diagnosis not present

## 2024-01-14 DIAGNOSIS — I160 Hypertensive urgency: Secondary | ICD-10-CM | POA: Diagnosis present

## 2024-01-14 DIAGNOSIS — K8012 Calculus of gallbladder with acute and chronic cholecystitis without obstruction: Secondary | ICD-10-CM | POA: Diagnosis present

## 2024-01-14 DIAGNOSIS — E11649 Type 2 diabetes mellitus with hypoglycemia without coma: Secondary | ICD-10-CM | POA: Diagnosis not present

## 2024-01-14 DIAGNOSIS — Z86711 Personal history of pulmonary embolism: Secondary | ICD-10-CM

## 2024-01-14 DIAGNOSIS — I10 Essential (primary) hypertension: Secondary | ICD-10-CM | POA: Diagnosis not present

## 2024-01-14 DIAGNOSIS — K2289 Other specified disease of esophagus: Secondary | ICD-10-CM | POA: Diagnosis present

## 2024-01-14 DIAGNOSIS — I5A Non-ischemic myocardial injury (non-traumatic): Secondary | ICD-10-CM | POA: Diagnosis present

## 2024-01-14 DIAGNOSIS — K219 Gastro-esophageal reflux disease without esophagitis: Secondary | ICD-10-CM | POA: Diagnosis present

## 2024-01-14 DIAGNOSIS — E875 Hyperkalemia: Secondary | ICD-10-CM | POA: Diagnosis present

## 2024-01-14 DIAGNOSIS — E1122 Type 2 diabetes mellitus with diabetic chronic kidney disease: Secondary | ICD-10-CM | POA: Diagnosis present

## 2024-01-14 DIAGNOSIS — N186 End stage renal disease: Secondary | ICD-10-CM | POA: Diagnosis present

## 2024-01-14 DIAGNOSIS — Z841 Family history of disorders of kidney and ureter: Secondary | ICD-10-CM

## 2024-01-14 DIAGNOSIS — E041 Nontoxic single thyroid nodule: Secondary | ICD-10-CM | POA: Insufficient documentation

## 2024-01-14 DIAGNOSIS — E042 Nontoxic multinodular goiter: Secondary | ICD-10-CM | POA: Diagnosis present

## 2024-01-14 DIAGNOSIS — R918 Other nonspecific abnormal finding of lung field: Secondary | ICD-10-CM | POA: Diagnosis not present

## 2024-01-14 DIAGNOSIS — Z7901 Long term (current) use of anticoagulants: Secondary | ICD-10-CM | POA: Diagnosis not present

## 2024-01-14 DIAGNOSIS — Z91048 Other nonmedicinal substance allergy status: Secondary | ICD-10-CM

## 2024-01-14 DIAGNOSIS — A419 Sepsis, unspecified organism: Principal | ICD-10-CM | POA: Diagnosis present

## 2024-01-14 DIAGNOSIS — Z992 Dependence on renal dialysis: Secondary | ICD-10-CM

## 2024-01-14 DIAGNOSIS — R5381 Other malaise: Secondary | ICD-10-CM | POA: Diagnosis present

## 2024-01-14 DIAGNOSIS — Z8249 Family history of ischemic heart disease and other diseases of the circulatory system: Secondary | ICD-10-CM | POA: Diagnosis not present

## 2024-01-14 DIAGNOSIS — N2581 Secondary hyperparathyroidism of renal origin: Secondary | ICD-10-CM | POA: Diagnosis present

## 2024-01-14 DIAGNOSIS — E785 Hyperlipidemia, unspecified: Secondary | ICD-10-CM | POA: Diagnosis present

## 2024-01-14 DIAGNOSIS — D631 Anemia in chronic kidney disease: Secondary | ICD-10-CM | POA: Diagnosis present

## 2024-01-14 DIAGNOSIS — I82409 Acute embolism and thrombosis of unspecified deep veins of unspecified lower extremity: Secondary | ICD-10-CM | POA: Diagnosis present

## 2024-01-14 DIAGNOSIS — E1129 Type 2 diabetes mellitus with other diabetic kidney complication: Secondary | ICD-10-CM | POA: Diagnosis present

## 2024-01-14 DIAGNOSIS — K319 Disease of stomach and duodenum, unspecified: Secondary | ICD-10-CM | POA: Diagnosis present

## 2024-01-14 DIAGNOSIS — I719 Aortic aneurysm of unspecified site, without rupture: Secondary | ICD-10-CM | POA: Diagnosis present

## 2024-01-14 DIAGNOSIS — I452 Bifascicular block: Secondary | ICD-10-CM | POA: Diagnosis present

## 2024-01-14 DIAGNOSIS — E1151 Type 2 diabetes mellitus with diabetic peripheral angiopathy without gangrene: Secondary | ICD-10-CM | POA: Diagnosis present

## 2024-01-14 DIAGNOSIS — Z833 Family history of diabetes mellitus: Secondary | ICD-10-CM

## 2024-01-14 DIAGNOSIS — I4510 Unspecified right bundle-branch block: Secondary | ICD-10-CM | POA: Diagnosis present

## 2024-01-14 DIAGNOSIS — Z887 Allergy status to serum and vaccine status: Secondary | ICD-10-CM

## 2024-01-14 DIAGNOSIS — R112 Nausea with vomiting, unspecified: Secondary | ICD-10-CM | POA: Diagnosis present

## 2024-01-14 DIAGNOSIS — I700 Atherosclerosis of aorta: Secondary | ICD-10-CM | POA: Diagnosis present

## 2024-01-14 DIAGNOSIS — I120 Hypertensive chronic kidney disease with stage 5 chronic kidney disease or end stage renal disease: Secondary | ICD-10-CM | POA: Diagnosis present

## 2024-01-14 DIAGNOSIS — Z86718 Personal history of other venous thrombosis and embolism: Secondary | ICD-10-CM

## 2024-01-14 DIAGNOSIS — K5900 Constipation, unspecified: Secondary | ICD-10-CM | POA: Diagnosis present

## 2024-01-14 LAB — URINALYSIS, W/ REFLEX TO CULTURE (INFECTION SUSPECTED)
Bilirubin Urine: NEGATIVE
Glucose, UA: 150 mg/dL — AB
Hgb urine dipstick: NEGATIVE
Ketones, ur: NEGATIVE mg/dL
Nitrite: NEGATIVE
Protein, ur: >=300 mg/dL — AB
Specific Gravity, Urine: 1.018 (ref 1.005–1.030)
Squamous Epithelial / HPF: >50 /HPF (ref 0–5)
pH: 9 — ABNORMAL HIGH (ref 5.0–8.0)

## 2024-01-14 LAB — TROPONIN I (HIGH SENSITIVITY)
Troponin I (High Sensitivity): 126 ng/L (ref <18)
Troponin I (High Sensitivity): 117 ng/L (ref <18)

## 2024-01-14 LAB — CBC WITH DIFFERENTIAL/PLATELET
Abs Immature Granulocytes: 0.04 K/uL (ref 0.00–0.07)
Basophils Absolute: 0 K/uL (ref 0.0–0.1)
Basophils Relative: 0 %
Eosinophils Absolute: 0.2 K/uL (ref 0.0–0.5)
Eosinophils Relative: 2 %
HCT: 36.3 % (ref 36.0–46.0)
Hemoglobin: 10.7 g/dL — ABNORMAL LOW (ref 12.0–15.0)
Immature Granulocytes: 0 %
Lymphocytes Relative: 10 %
Lymphs Abs: 1 K/uL (ref 0.7–4.0)
MCH: 29 pg (ref 26.0–34.0)
MCHC: 29.5 g/dL — ABNORMAL LOW (ref 30.0–36.0)
MCV: 98.4 fL (ref 80.0–100.0)
Monocytes Absolute: 0.7 K/uL (ref 0.1–1.0)
Monocytes Relative: 7 %
Neutro Abs: 8.1 K/uL — ABNORMAL HIGH (ref 1.7–7.7)
Neutrophils Relative %: 81 %
Platelets: 180 K/uL (ref 150–400)
RBC: 3.69 MIL/uL — ABNORMAL LOW (ref 3.87–5.11)
RDW: 14.9 % (ref 11.5–15.5)
WBC: 10.2 K/uL (ref 4.0–10.5)
nRBC: 0 % (ref 0.0–0.2)

## 2024-01-14 LAB — COMPREHENSIVE METABOLIC PANEL WITH GFR
ALT: 12 U/L (ref 0–44)
AST: 35 U/L (ref 15–41)
Albumin: 3.2 g/dL — ABNORMAL LOW (ref 3.5–5.0)
Alkaline Phosphatase: 114 U/L (ref 38–126)
Anion gap: 11 (ref 5–15)
BUN: 45 mg/dL — ABNORMAL HIGH (ref 8–23)
CO2: 29 mmol/L (ref 22–32)
Calcium: 9.4 mg/dL (ref 8.9–10.3)
Chloride: 102 mmol/L (ref 98–111)
Creatinine, Ser: 7.21 mg/dL — ABNORMAL HIGH (ref 0.44–1.00)
GFR, Estimated: 6 mL/min — ABNORMAL LOW (ref >60)
Glucose, Bld: 159 mg/dL — ABNORMAL HIGH (ref 70–99)
Potassium: 5.2 mmol/L — ABNORMAL HIGH (ref 3.5–5.1)
Sodium: 142 mmol/L (ref 135–145)
Total Bilirubin: 1 mg/dL (ref 0.0–1.2)
Total Protein: 6.1 g/dL — ABNORMAL LOW (ref 6.5–8.1)

## 2024-01-14 LAB — LACTIC ACID, PLASMA
Lactic Acid, Venous: 1 mmol/L (ref 0.5–1.9)
Lactic Acid, Venous: 1.4 mmol/L (ref 0.5–1.9)

## 2024-01-14 LAB — BRAIN NATRIURETIC PEPTIDE: B Natriuretic Peptide: 1533.1 pg/mL — ABNORMAL HIGH (ref 0.0–100.0)

## 2024-01-14 MED ORDER — OXYCODONE-ACETAMINOPHEN 5-325 MG PO TABS
1.0000 | ORAL_TABLET | ORAL | Status: DC | PRN
  Administered 2024-01-15 – 2024-01-16 (×3): 1 via ORAL
  Filled 2024-01-14 (×3): qty 1

## 2024-01-14 MED ORDER — ONDANSETRON HCL 4 MG/2ML IJ SOLN
4.0000 mg | Freq: Once | INTRAMUSCULAR | Status: AC
  Administered 2024-01-14: 4 mg via INTRAVENOUS
  Filled 2024-01-14: qty 2

## 2024-01-14 MED ORDER — ONDANSETRON 4 MG PO TBDP: 4.0000 mg | ORAL_TABLET | Freq: Once | ORAL | Status: AC

## 2024-01-14 MED ORDER — IBUPROFEN 400 MG PO TABS
600.0000 mg | ORAL_TABLET | Freq: Once | ORAL | Status: DC
  Filled 2024-01-14: qty 1

## 2024-01-14 MED ORDER — ONDANSETRON HCL 4 MG/2ML IJ SOLN: 4.0000 mg | Freq: Once | INTRAMUSCULAR | Status: DC

## 2024-01-14 MED ORDER — MORPHINE SULFATE (PF) 2 MG/ML IV SOLN: 2.0000 mg | INTRAVENOUS | Status: DC | PRN

## 2024-01-14 MED ORDER — ALBUTEROL SULFATE (2.5 MG/3ML) 0.083% IN NEBU: 2.5000 mg | INHALATION_SOLUTION | RESPIRATORY_TRACT | Status: DC | PRN

## 2024-01-14 MED ORDER — ALBUTEROL SULFATE HFA 108 (90 BASE) MCG/ACT IN AERS: 2.0000 | INHALATION_SPRAY | RESPIRATORY_TRACT | Status: DC | PRN

## 2024-01-14 MED ORDER — LIDOCAINE 5 % EX PTCH
1.0000 | MEDICATED_PATCH | Freq: Once | CUTANEOUS | Status: AC
  Administered 2024-01-14: 1 via TRANSDERMAL
  Filled 2024-01-14: qty 1

## 2024-01-14 MED ORDER — ONDANSETRON 4 MG PO TBDP
ORAL_TABLET | ORAL | Status: AC
  Administered 2024-01-14: 4 mg via ORAL
  Filled 2024-01-14: qty 1

## 2024-01-14 MED ORDER — HYDRALAZINE HCL 20 MG/ML IJ SOLN
10.0000 mg | Freq: Once | INTRAMUSCULAR | Status: AC
  Administered 2024-01-14: 10 mg via INTRAVENOUS
  Filled 2024-01-14: qty 0.5

## 2024-01-14 MED ORDER — METRONIDAZOLE 500 MG/100ML IV SOLN
500.0000 mg | Freq: Once | INTRAVENOUS | Status: AC
  Administered 2024-01-14: 500 mg via INTRAVENOUS
  Filled 2024-01-14: qty 100

## 2024-01-14 MED ORDER — LIDOCAINE 5 % EX PTCH
1.0000 | MEDICATED_PATCH | CUTANEOUS | Status: DC
  Administered 2024-01-14: 1 via TRANSDERMAL
  Filled 2024-01-14 (×2): qty 1

## 2024-01-14 MED ORDER — HYDRALAZINE HCL 20 MG/ML IJ SOLN
10.0000 mg | INTRAMUSCULAR | Status: DC | PRN
  Administered 2024-01-17: 10 mg via INTRAVENOUS
  Filled 2024-01-14: qty 1

## 2024-01-14 MED ORDER — DM-GUAIFENESIN ER 30-600 MG PO TB12: 1.0000 | ORAL_TABLET | Freq: Two times a day (BID) | ORAL | Status: DC | PRN

## 2024-01-14 MED ORDER — SODIUM CHLORIDE 0.9 % IV SOLN
2.0000 g | Freq: Once | INTRAVENOUS | Status: AC
  Administered 2024-01-14: 2 g via INTRAVENOUS
  Filled 2024-01-14: qty 20

## 2024-01-14 MED ORDER — ONDANSETRON HCL 4 MG/2ML IJ SOLN
INTRAMUSCULAR | Status: AC
Start: 2024-01-14 — End: 2024-01-15
  Filled 2024-01-14: qty 2

## 2024-01-14 MED ORDER — CHLORHEXIDINE GLUCONATE CLOTH 2 % EX PADS
6.0000 | MEDICATED_PAD | Freq: Every day | CUTANEOUS | Status: DC
  Administered 2024-01-15 – 2024-01-18 (×4): 6 via TOPICAL
  Filled 2024-01-14 (×2): qty 6

## 2024-01-14 MED ORDER — ACETAMINOPHEN 325 MG PO TABS
650.0000 mg | ORAL_TABLET | Freq: Once | ORAL | Status: AC
  Administered 2024-01-14: 650 mg via ORAL
  Filled 2024-01-14: qty 2

## 2024-01-14 MED ORDER — SODIUM CHLORIDE 0.9 % IV SOLN: INTRAVENOUS | Status: DC

## 2024-01-14 MED ORDER — ACETAMINOPHEN 325 MG PO TABS
650.0000 mg | ORAL_TABLET | Freq: Four times a day (QID) | ORAL | Status: DC | PRN
  Administered 2024-01-17 (×2): 650 mg via ORAL
  Filled 2024-01-14: qty 2

## 2024-01-14 MED ORDER — OXYCODONE-ACETAMINOPHEN 5-325 MG PO TABS
1.0000 | ORAL_TABLET | Freq: Once | ORAL | Status: AC
  Administered 2024-01-14: 1 via ORAL
  Filled 2024-01-14: qty 1

## 2024-01-14 MED ORDER — INSULIN ASPART 100 UNIT/ML IJ SOLN: 0.0000 [IU] | Freq: Every day | INTRAMUSCULAR | Status: DC

## 2024-01-14 MED ORDER — ACETAMINOPHEN 325 MG PO TABS
325.0000 mg | ORAL_TABLET | Freq: Once | ORAL | Status: AC
  Administered 2024-01-14: 325 mg via ORAL
  Filled 2024-01-14: qty 1

## 2024-01-14 MED ORDER — LABETALOL HCL 5 MG/ML IV SOLN
10.0000 mg | INTRAVENOUS | Status: AC | PRN
  Administered 2024-01-14 (×3): 10 mg via INTRAVENOUS
  Filled 2024-01-14 (×2): qty 4

## 2024-01-14 MED ORDER — INSULIN ASPART 100 UNIT/ML IJ SOLN: 0.0000 [IU] | Freq: Three times a day (TID) | INTRAMUSCULAR | Status: DC

## 2024-01-14 MED ORDER — HYDROMORPHONE HCL 1 MG/ML IJ SOLN
1.0000 mg | Freq: Once | INTRAMUSCULAR | Status: AC
  Administered 2024-01-14: 1 mg via INTRAVENOUS
  Filled 2024-01-14: qty 1

## 2024-01-14 MED ORDER — IOHEXOL 350 MG/ML SOLN
75.0000 mL | Freq: Once | INTRAVENOUS | Status: AC | PRN
  Administered 2024-01-14: 75 mL via INTRAVENOUS

## 2024-01-14 MED ORDER — ONDANSETRON HCL 4 MG/2ML IJ SOLN
4.0000 mg | Freq: Three times a day (TID) | INTRAMUSCULAR | Status: DC | PRN
  Administered 2024-01-15 – 2024-01-16 (×3): 4 mg via INTRAVENOUS
  Filled 2024-01-14 (×3): qty 2

## 2024-01-14 NOTE — ED Provider Notes (Signed)
 Notified by RN that this patient had returned from dialysis, but seemed confused.  I reviewed the patient's chart.  68 year old who presented with nausea and vomiting as well as syncopal episode.  Noted to be hypertensive.  She was taken to dialysis with initial plans for discharge following this.  Initial labs with CBC with mild anemia, CMP with minimal hyperkalemia.  UA with questionable infection though contaminated sample.  Stable mild to moderate troponin elevation, elevated BNP.  I evaluate the patient after he returned from dialysis.  She was arousable but somnolent.  She was unable to tell me where she lives or if she walked independently.  She was complaining of pain in her abdomen most notably in her right upper quadrant.  Family noted that she has been complaining of pain under her right ribs, had a subacute rib fracture in this area.  Also note history of DVT, is on Eliquis .  Repeat vitals demonstrated fever and tachycardia.  In the setting of this, I did order additional workup including CT of her head, CTA chest, CT abdomen pelvis, but suspect she may require admission.  Clinical Course as of 01/15/24 0014  Fri Jan 14, 2024  2051 CT Angio Chest PE W and/or Wo Contrast Checked on status of CTs with CT tech.  They note that patient's IV was not working when they attempted to inject contrast, they are waiting on additional access to finish her CT scans. [NR]  2231 CT concerning for acute cholecystitis.  Will review with general surgery. [NR]  2300 Case discussed with Dr. Marinda with general surgery.  He agrees that clinical history and CT findings are concerning for cholecystitis, but with patient's multiple comorbidities recommends medicine admission, initiation of IV antibiotics.  He will consult in the morning to further evaluate if patient is appropriate operative candidate.  Will reach out to hospitalist team. [NR]  2339 Case discussed with Dr. Hilma.  He will evaluate for anticipated  admission. [NR]    Clinical Course User Index [NR] Levander Slate, MD      Levander Slate, MD 01/15/24 [phone]

## 2024-01-14 NOTE — Progress Notes (Signed)
 . [facility]  ROUNDING NOTE   Subjective:   Patient well-known to us  as we follow her for outpatient hemodialysis treatments. She came in with presyncopal symptoms as well as nausea and vomiting. Blood pressure was quite high upon arrival here at 220. Patient underwent dialysis treatment today and total UF achieved was 1.4 L after she arrived here at the emergency department. She was also given hydralazine  10 mg IV at the initiation of treatment.  Objective:  Vital signs in last 24 hours:  Temp:  [99.1 F (37.3 C)-101.4 F (38.6 C)] 101.4 F (38.6 C) (07/25 1755) Pulse Rate:  [71-110] 104 (07/25 1715) Resp:  [12-30] 20 (07/25 1715) BP: (101-221)/(42-89) 145/63 (07/25 1715) SpO2:  [90 %-100 %] 94 % (07/25 1715) Weight:  [56.2 kg] 56.2 kg (07/25 0423)  Weight change:  Filed Weights   01/14/24 0423  Weight: 56.2 kg    Intake/Output: I/O last 3 completed shifts: In: -  Out: 1400 [Other:1400]   Intake/Output this shift:  No intake/output data recorded.  Physical Exam: General: No acute distress  Head: Normocephalic, atraumatic. Moist oral mucosal membranes  Neck: Supple  Lungs:  Scattered rhonchi  Heart: S1S2 no rubs  Abdomen:  Soft, nontender, bowel sounds present  Extremities: Trace peripheral edema.  Neurologic: Awake, alert, following commands  Skin: No acute rash  Access: Left upper extremity AV graft    Basic Metabolic Panel: Recent Labs  Lab 01/14/24 0426  NA 142  K 5.2*  CL 102  CO2 29  GLUCOSE 159*  BUN 45*  CREATININE 7.21*  CALCIUM  9.4    Liver Function Tests: Recent Labs  Lab 01/14/24 0426  AST 35  ALT 12  ALKPHOS 114  BILITOT 1.0  PROT 6.1*  ALBUMIN 3.2*   No results for input(s): LIPASE, AMYLASE in the last 168 hours. No results for input(s): AMMONIA in the last 168 hours.  CBC: Recent Labs  Lab 01/14/24 0426  WBC 10.2  NEUTROABS 8.1*  HGB 10.7*  HCT 36.3  MCV 98.4  PLT 180    Cardiac Enzymes: No  results for input(s): CKTOTAL, CKMB, CKMBINDEX, TROPONINI in the last 168 hours.  BNP: Invalid input(s): POCBNP  CBG: No results for input(s): GLUCAP in the last 168 hours.  Microbiology: Results for orders placed or performed during the hospital encounter of 07/04/23  Resp panel by RT-PCR (RSV, Flu A&B, Covid) Anterior Nasal Swab     Status: Abnormal   Collection Time: 07/04/23  1:48 PM   Specimen: Anterior Nasal Swab  Result Value Ref Range Status   SARS Coronavirus 2 by RT PCR NEGATIVE NEGATIVE Final    Comment: (NOTE) SARS-CoV-2 target nucleic acids are NOT DETECTED.  The SARS-CoV-2 RNA is generally detectable in upper respiratory specimens during the acute phase of infection. The lowest concentration of SARS-CoV-2 viral copies this assay can detect is 138 copies/mL. A negative result does not preclude SARS-Cov-2 infection and should not be used as the sole basis for treatment or other patient management decisions. A negative result may occur with  improper specimen collection/handling, submission of specimen other than nasopharyngeal swab, presence of viral mutation(s) within the areas targeted by this assay, and inadequate number of viral copies(<138 copies/mL). A negative result must be combined with clinical observations, patient history, and epidemiological information. The expected result is Negative.  Fact Sheet for Patients:  BloggerCourse.com  Fact Sheet for Healthcare Providers:  SeriousBroker.it  This test is no t yet approved or cleared by the [place]  [place] FDA and  has been authorized for detection and/or diagnosis of SARS-CoV-2 by FDA under an Emergency Use Authorization (EUA). This EUA will remain  in effect (meaning this test can be used) for the duration of the COVID-19 declaration under Section 564(b)(1) of the Act, 21 U.S.C.section 360bbb-3(b)(1), unless the authorization is terminated  or  revoked sooner.       Influenza A by PCR NEGATIVE NEGATIVE Final   Influenza B by PCR NEGATIVE NEGATIVE Final    Comment: (NOTE) The Xpert Xpress SARS-CoV-2/FLU/RSV plus assay is intended as an aid in the diagnosis of influenza from Nasopharyngeal swab specimens and should not be used as a sole basis for treatment. Nasal washings and aspirates are unacceptable for Xpert Xpress SARS-CoV-2/FLU/RSV testing.  Fact Sheet for Patients: BloggerCourse.com  Fact Sheet for Healthcare Providers: SeriousBroker.it  This test is not yet approved or cleared by the [place]  FDA and has been authorized for detection and/or diagnosis of SARS-CoV-2 by FDA under an Emergency Use Authorization (EUA). This EUA will remain in effect (meaning this test can be used) for the duration of the COVID-19 declaration under Section 564(b)(1) of the Act, 21 U.S.C. section 360bbb-3(b)(1), unless the authorization is terminated or revoked.     Resp Syncytial Virus by PCR POSITIVE (A) NEGATIVE Final    Comment: (NOTE) Fact Sheet for Patients: BloggerCourse.com  Fact Sheet for Healthcare Providers: SeriousBroker.it  This test is not yet approved or cleared by the [place]  FDA and has been authorized for detection and/or diagnosis of SARS-CoV-2 by FDA under an Emergency Use Authorization (EUA). This EUA will remain in effect (meaning this test can be used) for the duration of the COVID-19 declaration under Section 564(b)(1) of the Act, 21 U.S.C. section 360bbb-3(b)(1), unless the authorization is terminated or revoked.  Performed at [facility], [address] [place], [place] [address]   Blood Culture (routine x 2)     Status: None   Collection Time: 07/04/23  3:41 PM   Specimen: BLOOD  Result Value Ref Range Status   Specimen Description BLOOD BLOOD RIGHT WRIST  Final   Special  Requests   Final    BOTTLES DRAWN AEROBIC AND ANAEROBIC Blood Culture adequate volume   Culture   Final    NO GROWTH 5 DAYS Performed at [facility], [address], [place], [place] [address]    Report Status 07/09/2023 FINAL  Final  Blood Culture (routine x 2)     Status: None   Collection Time: 07/04/23  3:41 PM   Specimen: BLOOD  Result Value Ref Range Status   Specimen Description BLOOD BLOOD RIGHT HAND  Final   Special Requests   Final    BOTTLES DRAWN AEROBIC AND ANAEROBIC Blood Culture adequate volume   Culture   Final    NO GROWTH 5 DAYS Performed at [facility], [address], [place], [place] [address]    Report Status 07/09/2023 FINAL  Final  MRSA Next Gen by PCR, Nasal     Status: None   Collection Time: 07/04/23  6:20 PM   Specimen: Nasal Mucosa; Nasal Swab  Result Value Ref Range Status   MRSA by PCR Next Gen NOT DETECTED NOT DETECTED Final    Comment: (NOTE) The GeneXpert MRSA Assay (FDA approved for NASAL specimens only), is one component of a comprehensive MRSA colonization surveillance program. It is not intended to diagnose MRSA infection nor to guide or monitor treatment for MRSA infections. Test performance is  not FDA approved in patients less than 23 years old. Performed at [facility], [address] [place], [place] [address]   Culture, Respiratory w Gram Stain     Status: None   Collection Time: 07/05/23  8:07 AM   Specimen: Tracheal Aspirate; Respiratory  Result Value Ref Range Status   Specimen Description   Final    TRACHEAL ASPIRATE Performed at [facility], [address], [place], [place] [address]    Special Requests   Final    NONE Performed at [facility], [address] [place], [place] [address]    Gram Stain   Final    MODERATE WBC PRESENT, PREDOMINANTLY PMN RARE GRAM POSITIVE COCCI IN PAIRS    Culture   Final    NO GROWTH 2 DAYS Performed at [facility], [address], [place], [place] [address]    Report Status 07/08/2023 FINAL  Final  Culture, Respiratory w Gram Stain     Status: None   Collection Time: 07/05/23  3:26 PM   Specimen: Bronchoalveolar Lavage; Respiratory  Result Value Ref Range Status   Specimen Description   Final    BRONCHIAL ALVEOLAR LAVAGE Performed at [facility], [address] [place], [place] [address]    Special Requests   Final    NONE Performed at [facility], [address] [place], [place] [address]    Gram Stain   Final    FEW WBC PRESENT, PREDOMINANTLY PMN NO ORGANISMS SEEN    Culture   Final    RARE Normal respiratory flora-no Staph aureus or Pseudomonas seen Performed at [facility], [address], [place], [place] [address]    Report Status 07/08/2023 FINAL  Final    Coagulation Studies: No results for input(s): LABPROT, INR in the last 72 hours.  Urinalysis: No results for input(s): COLORURINE, LABSPEC, PHURINE, GLUCOSEU, HGBUR, BILIRUBINUR, KETONESUR, PROTEINUR, UROBILINOGEN, NITRITE, LEUKOCYTESUR in the last 72 hours.  Invalid input(s): APPERANCEUR    Imaging: CT Head Wo Contrast Result Date: 01/14/2024 CLINICAL DATA:  Mental status change, unknown cause EXAM: CT HEAD WITHOUT CONTRAST TECHNIQUE: Contiguous axial images were obtained from the base of the skull through the vertex without intravenous contrast. RADIATION DOSE REDUCTION: This exam was performed according to the departmental dose-optimization program which includes automated exposure control, adjustment of the mA and/or kV according to patient size and/or use of iterative reconstruction technique. COMPARISON:  None Available. FINDINGS: Brain: Normal anatomic configuration. No abnormal intra or extra-axial mass lesion or fluid collection. No abnormal mass effect or midline shift. No evidence of acute intracranial hemorrhage or infarct. Ventricular size is normal. Cerebellum  unremarkable. Vascular: Unremarkable Skull: Intact Sinuses/Orbits: Paranasal sinuses are clear. Orbits are unremarkable. Other: Mastoid air cells and middle ear cavities are clear. IMPRESSION: 1. Normal CT examination of the brain. Electronically Signed   By: Dorethia Molt M.D.   On: 01/14/2024 19:16   DG Chest Port 1 View Result Date: 01/14/2024 CLINICAL DATA:  Dizziness, chest pain, syncopal episode. EXAM: PORTABLE CHEST 1 VIEW COMPARISON:  Portable chest 07/08/2023. FINDINGS: The heart is moderately enlarged. Interval TAVR. Stable mediastinum. The aorta is tortuous and calcified. There is central vascular prominence and mild flow cephalization but there is no overt edema. The lungs are clear of infiltrates. No pleural effusion is seen. A left axillary vascular stent is again noted. Advanced thoracic spondylosis. IMPRESSION: 1. Cardiomegaly with central vascular prominence and mild flow cephalization but no  overt edema. 2. Interval TAVR. 3. Aortic atherosclerosis. Electronically Signed   By: Francis Quam M.D.   On: 01/14/2024 04:48     Medications:     Chlorhexidine  Gluconate Cloth  6 each Topical Q0600   lidocaine   1 patch Transdermal Q24H   lidocaine   1 patch Transdermal Once   ondansetron        ondansetron  (ZOFRAN ) IV  4 mg Intravenous Once   ondansetron   Assessment/ Plan:  68 y.o. female with past medical history of ESRD on HD, hypertension, diabetes mellitus type 2, DVT, aortic stenosis status post TAVR 08/19/2023, anemia of chronic disease, secondary hyperparathyroidism who presented with presyncopal symptoms and severe hypertension.  1.  ESRD on HD MWF.  Patient missed her regularly scheduled outpatient dialysis treatment today.  Therefore we scheduled dialysis treatment while here and patient completed the treatment.  UF achieved was 1.4 kg.  Next scheduled treatment on Monday if still here.  2.  Hypertension.  Systolic blood pressure was 220 upon admission.  She was given  hydralazine .  Blood pressure posttreatment was 145/63.  3.  Anemia of chronic kidney disease.  Hemoglobin 10.7.  Maintain the patient on Mircera as outpatient.  4.  Hyperkalemia.  Serum potassium 5.2.  Patient treated with 2K bath.   LOS: 0 Margarette Vannatter 7/25/20257:19 PM

## 2024-01-14 NOTE — ED Provider Notes (Signed)
 [facility] Provider Note   Event Date/Time   First MD Initiated Contact with Patient 01/14/24 0404     (approximate) History  Loss of Consciousness  HPI Christy Curry is a 68 y.o. female with a past medical history of end-stage renal disease on dialysis M/W/F who presents complaining of 1 episode of syncope as well as associated nausea/vomiting that began just prior to arrival.  Patient presents via EMS after the symptoms complaining of severe back pain, nausea, and headache in the setting of hypertension with a systolic of 220.  Patient denies any exacerbating or relieving factors.  Patient denies any subsequent syncopal episodes. ROS: Patient currently denies any vision changes, tinnitus, difficulty speaking, facial droop, sore throat, chest pain, shortness of breath, abdominal pain, nausea/vomiting/diarrhea, dysuria, or weakness/numbness/paresthesias in any extremity   Physical Exam  Triage Vital Signs: ED Triage Vitals  Encounter Vitals Group     BP 01/14/24 0401 (!) 221/76     Girls Systolic BP Percentile --      Girls Diastolic BP Percentile --      Boys Systolic BP Percentile --      Boys Diastolic BP Percentile --      Pulse Rate 01/14/24 0401 71     Resp 01/14/24 0401 15     Temp 01/14/24 0401 99.1 F (37.3 C)     Temp Source 01/14/24 0401 Oral     SpO2 01/14/24 0401 97 %     Weight 01/14/24 0423 124 lb (56.2 kg)     Height 01/14/24 0423 5' 1 (1.549 m)     Head Circumference --      Peak Flow --      Pain Score 01/14/24 0422 8     Pain Loc --      Pain Education --      Exclude from Growth Chart --    Most recent vital signs: Vitals:   01/14/24 0630 01/14/24 0645  BP: (!) 172/65   Pulse:    Resp: (!) 30 19  Temp:    SpO2:     General: Awake, oriented x4. CV:  Good peripheral perfusion. Resp:  Normal effort. Abd:  No distention. Other:  Elderly overweight African-American female resting comfortably in no acute distress ED  Results / Procedures / Treatments  Labs (all labs ordered are listed, but only abnormal results are displayed) Labs Reviewed  COMPREHENSIVE METABOLIC PANEL WITH GFR - Abnormal; Notable for the following components:      Result Value   Potassium 5.2 (*)    Glucose, Bld 159 (*)    BUN 45 (*)    Creatinine, Ser 7.21 (*)    Total Protein 6.1 (*)    Albumin 3.2 (*)    GFR, Estimated 6 (*)    All other components within normal limits  CBC WITH DIFFERENTIAL/PLATELET - Abnormal; Notable for the following components:   RBC 3.69 (*)    Hemoglobin 10.7 (*)    MCHC 29.5 (*)    Neutro Abs 8.1 (*)    All other components within normal limits  BRAIN NATRIURETIC PEPTIDE - Abnormal; Notable for the following components:   B Natriuretic Peptide 1,533.1 (*)    All other components within normal limits  TROPONIN I (HIGH SENSITIVITY) - Abnormal; Notable for the following components:   Troponin I (High Sensitivity) 126 (*)    All other components within normal limits  TROPONIN I (HIGH SENSITIVITY) - Abnormal; Notable for the following components:  Troponin I (High Sensitivity) 117 (*)    All other components within normal limits  LACTIC ACID, PLASMA  LACTIC ACID, PLASMA  URINALYSIS, W/ REFLEX TO CULTURE (INFECTION SUSPECTED)   EKG ED ECG REPORT I, Artist MARLA Kerns, the attending physician, personally viewed and interpreted this ECG. Date: 01/14/2024 EKG Time: 0405 Rate: 95 Rhythm: normal sinus rhythm QRS Axis: normal Intervals: normal ST/T Wave abnormalities: normal Narrative Interpretation: no evidence of acute ischemia RADIOLOGY ED MD interpretation: Single view portable chest x-ray shows cardiomegaly with central vascular prominence - All radiology independently interpreted and agree with radiology assessment Official radiology report(s): DG Chest Port 1 View Result Date: 01/14/2024 CLINICAL DATA:  Dizziness, chest pain, syncopal episode. EXAM: PORTABLE CHEST 1 VIEW COMPARISON:  Portable  chest 07/08/2023. FINDINGS: The heart is moderately enlarged. Interval TAVR. Stable mediastinum. The aorta is tortuous and calcified. There is central vascular prominence and mild flow cephalization but there is no overt edema. The lungs are clear of infiltrates. No pleural effusion is seen. A left axillary vascular stent is again noted. Advanced thoracic spondylosis. IMPRESSION: 1. Cardiomegaly with central vascular prominence and mild flow cephalization but no overt edema. 2. Interval TAVR. 3. Aortic atherosclerosis. Electronically Signed   By: Francis Quam M.D.   On: 01/14/2024 04:48   PROCEDURES: Critical Care performed: No Procedures MEDICATIONS ORDERED IN ED: Medications  lidocaine  (LIDODERM ) 5 % 1 patch (1 patch Transdermal Patch Applied 01/14/24 0543)  labetalol  (NORMODYNE ) injection 10 mg (10 mg Intravenous Given 01/14/24 0535)  oxyCODONE -acetaminophen  (PERCOCET/ROXICET) 5-325 MG per tablet 1 tablet (1 tablet Oral Given 01/14/24 0535)  ondansetron  (ZOFRAN ) injection 4 mg (4 mg Intravenous Given 01/14/24 0535)  HYDROmorphone (DILAUDID) injection 1 mg (1 mg Intravenous Given 01/14/24 0618)   IMPRESSION / MDM / ASSESSMENT AND PLAN / ED COURSE  I reviewed the triage vital signs and the nursing notes.                             The patient is on the cardiac monitor to evaluate for evidence of arrhythmia and/or significant heart rate changes. Patient's presentation is most consistent with acute presentation with potential threat to life or bodily function. Patient presents with complaints of syncope ED Workup:  CBC, BMP, Troponin, BNP, ECG, CXR Differential diagnosis includes HF, ICH, seizure, stroke, HOCM, ACS, aortic dissection, malignant arrhythmia, or GI bleed. Findings: No evidence of acute laboratory abnormalities.  Troponin negative x1 EKG: No e/o STEMI. No evidence of Brugada's sign, delta wave, epsilon wave, significantly prolonged QTc, or malignant arrhythmia. SBP greater than  220-labetalol  10 mg x 3 as needed Patient's blood pressure responded adequately.  Patient's nausea under control and p.o. tolerant prior to discharge.  I spoke to Dr. Marcelino and nephrology who plans on discharging patient after dialysis Disposition: Discharge. Patient is at baseline at this time. Return precautions expressed and understood in person. Advised follow up with primary care provider or clinic physician in next 24 hours.   FINAL CLINICAL IMPRESSION(S) / ED DIAGNOSES   Final diagnoses:  Syncope and collapse  Hypertension, unspecified type   Rx / DC Orders   ED Discharge Orders     None      Note:  This document was prepared using Dragon voice recognition software and may include unintentional dictation errors.   Jamarrius Salay K, MD 01/14/24 [phone]

## 2024-01-14 NOTE — ED Notes (Signed)
 Lab called to obtain blood cultures on the patient due to her being a hard stick.

## 2024-01-14 NOTE — H&P (Signed)
 History and Physical    Christy Curry FMW:[id number] DOB: 04-14-1956 DOA: 01/14/2024  Referring MD/NP/PA:   PCP: Gwenyth Barnacle, MD   Patient coming from:  The patient is coming from home.     Chief Complaint: Nausea, vomiting, abdominal pain, fever, dizziness, syncope  HPI: Christy Curry is a 68 y.o. female with medical history significant of ESRD-HD (MWF), aortic stenosis (s/p of TAVR), DVT on Eliquis , HTN,  HLD, DM, CAD, dCHF, anemia, esophageal thickening, who presents with nausea, vomiting, abdominal pain, fever, dizziness, syncope.  Patient states that she started having nausea, vomiting abdominal pain last night.  She has had more than 5 times of nonbilious nonbloody vomiting.  No diarrhea.  Her abdominal pain is located in the right upper quadrant, which is constant, aching, moderate, nonradiating, not aggravated or alleviated by any known factors.  Associated with fever and chills.  She states that her blood pressure was elevated more than 200 in the early morning.  She states that she felt dizzy and passed out for few seconds around 2 AM.  Denies unilateral numbness or tingling in extremities.  No facial droop or slurred speech.  She moves all extremities normally. She reports right rib cage pain, denies chest pain, SOB, cough.  No symptoms of UTI.  Patient was found to have hyperkalemia with potassium of 5.2, and blood pressure 221/76 in ED.  Patient underwent dialysis by Dr. Mal of renal.  After dialysis, her blood pressure improved to 128/50.  Data reviewed independently and ED Course: pt was found to have WBC 10.2, lactic acid 1.0 --> 1.4, troponin 126- > 117, potassium 5.2, bicarbonate 29, creatinine 7.21, BNP 45, UA (cloudy appearance, trace amount of leukocyte, many bacteria, WBC 11-20, with squamous cell contamination> 50).  Temperature 101.4, heart rate 110, RR 30, oxygen saturation 97% on room air currently.  Chest x-ray showed cardiomegaly and vascular  congestion.  CT of head negative for acute intracranial abnormalities.  CTA of the chest negative for PE.  CT of abdomen/pelvis showed gallstone and cholecystitis.  Patient is admitted to PCU as inpatient.  Dr. Marinda of surgery is consulted by EDP.  CT of abdomen/pelvis: 1. Distended gallbladder with multiple stones at the gallbladder neck. Diffuse gallbladder wall thickening and or pericholecystic fluid. Imaging appearance is suspicious for an acute cholecystitis. Correlation with ultrasound is suggested. 2. Stomach is decompressed but there may be diffuse gastric wall thickening, question gastritis. Distal esophageal and GE junction thickening as well. Focal lobulated hyperenhancing area at the gastric antrum measuring 15 x 14 x 16 mm, possible enhancing mucosal lesion/mass. Recommend further evaluation with endoscopy. 3. Delayed excretion of contrast from the kidneys, correlate with appropriate lab values. There appears to be excreted contrast though within the urinary bladder (? Has the patient had recent contrast administration prior to today )   Aortic Atherosclerosis (ICD10-I70.0).   CTA of the chest: 1. Negative for acute pulmonary embolus or aortic dissection. 2. Cardiomegaly with interval TAVR. 3. 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 4. Multiple small pulmonary nodules measuring up to 5 mm, stable. No specific imaging follow-up is recommended. 5. Small distal esophageal hernia with prominent distal esophageal thickening and thickening at the GE junction. Endoscopy follow-up if deemed appropriate. 6. Several small thyroid  nodules measuring up to 1.5 cm. Recommend thyroid  US  (ref: J Am Coll Radiol. 2015 Feb;12(2): 143-50).This should be performed non emergently 7. Aortic atherosclerosis.   Aortic Atherosclerosis (ICD10-I70.0).  EKG: I have personally reviewed.  Sinus rhythm, QTc 482, bifascicular  block, low voltage.  Review of Systems:   General: has fevers, chills, no body weight gain, has poor appetite, has fatigue HEENT: no blurry vision, hearing changes or sore throat Respiratory: no dyspnea, coughing, wheezing CV: no chest pain, no palpitations GI: has nausea, vomiting, abdominal pain, no diarrhea, constipation GU: no dysuria, burning on urination, increased urinary frequency, hematuria  Ext: no leg edema Neuro: no unilateral weakness, numbness, or tingling, no vision change or hearing loss. Has dizziness and syncope Skin: no rash, no skin tear. MSK: No muscle spasm, no deformity, no limitation of range of movement in spin. Has right rib cage pain Heme: No easy bruising.  Travel history: No recent long distant travel.   Allergy:  Allergies  Allergen Reactions   2,4-D Dimethylamine Swelling   Other Swelling   Tetanus Antitoxin Itching   Tetanus Toxoid    Tetanus Toxoids Itching    Past Medical History:  Diagnosis Date   Anemia of chronic disease    Aortic stenosis    Diabetes mellitus with complication (HCC)    Diabetic angiopathy (HCC)    Diabetic retinopathy (HCC)    ESRD (end stage renal disease) (HCC)    HLD (hyperlipidemia)    Hypertension    RBBB     Past Surgical History:  Procedure Laterality Date   av fistula Left     Social History:  reports that she has never smoked. She has never used smokeless tobacco. She reports that she does not drink alcohol and does not use drugs.  Family History:  Family History  Problem Relation Age of Onset   Kidney disease Mother    Diabetes Father    Peripheral Artery Disease Daughter 48       bilateral amputation     Prior to Admission medications   Medication Sig Start Date End Date Taking? Authorizing Provider  acetaminophen  (TYLENOL ) 325 MG tablet Take 2 tablets (650 mg total) by mouth every 6 (six) hours as needed for fever or headache. 07/11/23   Nelson, Dana G, NP  aspirin  EC 81 MG tablet Take 1  tablet (81 mg total) by mouth daily. Swallow whole. 07/12/23   Nelson, Dana G, NP  atorvastatin  (LIPITOR ) 80 MG tablet Take 1 tablet (80 mg total) by mouth at bedtime. 07/11/23   Nelson, Dana G, NP  bisacodyl  (DULCOLAX) 10 MG suppository Place 1 suppository (10 mg total) rectally daily as needed for moderate constipation. 07/11/23   Nelson, Dana G, NP  Chlorhexidine  Gluconate Cloth 2 % PADS Apply 6 each topically daily at 6 (six) AM. 07/12/23   Nelson, Dana G, NP  docusate sodium  (COLACE) 100 MG capsule Take 1 capsule (100 mg total) by mouth 2 (two) times daily as needed for mild constipation. 07/11/23   Nelson, Dana G, NP  heparin  5000 UNIT/ML injection Inject 1 mL (5,000 Units total) into the skin every 8 (eight) hours. 07/11/23   Nelson, Dana G, NP  hydrALAZINE  (APRESOLINE ) 20 MG/ML injection Inject 0.5-1 mLs (10-20 mg total) into the vein every 4 (four) hours as needed (SBP > 150, 1st line). 07/11/23   Nelson, Dana G, NP  insulin  aspart (NOVOLOG ) 100 UNIT/ML injection Inject 0-6 Units into the skin every 4 (four) hours. 07/11/23   Nelson, Dana G, NP  ipratropium-albuterol  (DUONEB) 0.5-2.5 (3) MG/3ML SOLN Take 3 mLs by nebulization every 6 (six) hours as needed. 07/11/23   Nelson, Dana G, NP  ipratropium-albuterol  (DUONEB)  0.5-2.5 (3) MG/3ML SOLN Take 3 mLs by nebulization 4 (four) times daily. 07/11/23   Nelson, Dana G, NP  ipratropium-albuterol  (DUONEB) 0.5-2.5 (3) MG/3ML SOLN Take 3 mLs by nebulization 3 (three) times daily. 07/12/23   Keene, Jeremiah D, NP  labetalol  (NORMODYNE ) 5 MG/ML injection Inject 2 mLs (10 mg total) into the vein every 2 (two) hours as needed (SBP > 150). 07/11/23   Nelson, Dana G, NP  menthol -cetylpyridinium (CEPACOL) 3 MG lozenge Take 1 lozenge (3 mg total) by mouth as needed for sore throat. 07/11/23   Nelson, Dana G, NP  multivitamin (RENA-VIT) TABS tablet Take 1 tablet by mouth at bedtime. 07/11/23   Nelson, Dana G, NP  Nutritional Supplements (FEEDING SUPPLEMENT, NEPRO CARB  STEADY,) LIQD Take 237 mLs by mouth 2 (two) times daily. 07/11/23   Nelson, Dana G, NP  ondansetron  (ZOFRAN ) 4 MG/2ML SOLN injection Inject 2 mLs (4 mg total) into the vein every 6 (six) hours as needed for nausea or vomiting. 07/11/23   Nelson, Dana G, NP  pantoprazole  (PROTONIX ) 40 MG injection Inject 40 mg into the vein daily. 07/12/23   Nelson, Dana G, NP  piperacillin -tazobactam (ZOSYN ) 2-0.25 GM/50ML IVPB Inject 50 mLs (2.25 g total) into the vein every 8 (eight) hours. 07/11/23   Nelson, Dana G, NP  polyethylene glycol (MIRALAX  / GLYCOLAX ) 17 g packet Take 17 g by mouth daily as needed for moderate constipation. 07/11/23   Nelson, Dana G, NP  sevelamer  carbonate (RENVELA ) 0.8 g PACK packet Take 0.8 g by mouth 3 (three) times daily with meals. 07/11/23   Nelson, Dana G, NP  traZODone  (DESYREL ) 50 MG tablet Take 1 tablet (50 mg total) by mouth at bedtime as needed for sleep. 07/12/23   Shellia Inge BIRCH, NP    Physical Exam: Vitals:   01/14/24 2030 01/14/24 2147 01/14/24 2149 01/15/24 0024  BP: (!) 121/56  (!) 128/50 (!) 149/62  Pulse: 89  100 98  Resp: 16  20 18   Temp:  99.9 F (37.7 C)    TempSrc:  Oral    SpO2: 95%  97% 95%  Weight:      Height:       General: Not in acute distress HEENT:       Eyes: PERRL, EOMI, no jaundice       ENT: No discharge from the ears and nose, no pharynx injection, no tonsillar enlargement.        Neck: No JVD, no bruit, no mass felt. Heme: No neck lymph node enlargement. Cardiac: S1/S2, RRR, has 3/6 aortic systolic murmur with click, no gallops or rubs. Respiratory: No rales, wheezing, rhonchi or rubs. GI: Soft, nondistended, has right upper quadrant tenderness, no rebound pain, no organomegaly, BS present. GU: No hematuria Ext: No pitting leg edema bilaterally. 1+DP/PT pulse bilaterally. Musculoskeletal: No joint deformities, No joint redness or warmth, no limitation of ROM in spin. Skin: No rashes.  Neuro: Alert, oriented X3, cranial nerves II-XII  grossly intact, moves all extremities normally. Psych: Patient is not psychotic, no suicidal or hemocidal ideation.  Labs on Admission: I have personally reviewed following labs and imaging studies  CBC: Recent Labs  Lab 01/14/24 0426  WBC 10.2  NEUTROABS 8.1*  HGB 10.7*  HCT 36.3  MCV 98.4  PLT 180   Basic Metabolic Panel: Recent Labs  Lab 01/14/24 0426  NA 142  K 5.2*  CL 102  CO2 29  GLUCOSE 159*  BUN 45*  CREATININE 7.21*  CALCIUM  9.4  GFR: Estimated Creatinine Clearance: 5.6 mL/min (A) (by C-G formula based on SCr of 7.21 mg/dL (H)). Liver Function Tests: Recent Labs  Lab 01/14/24 0426  AST 35  ALT 12  ALKPHOS 114  BILITOT 1.0  PROT 6.1*  ALBUMIN 3.2*   No results for input(s): LIPASE, AMYLASE in the last 168 hours. No results for input(s): AMMONIA in the last 168 hours. Coagulation Profile: No results for input(s): INR, PROTIME in the last 168 hours. Cardiac Enzymes: No results for input(s): CKTOTAL, CKMB, CKMBINDEX, TROPONINI in the last 168 hours. BNP (last 3 results) No results for input(s): PROBNP in the last 8760 hours. HbA1C: No results for input(s): HGBA1C in the last 72 hours. CBG: Recent Labs  Lab 01/15/24 0114  GLUCAP 193*   Lipid Profile: No results for input(s): CHOL, HDL, LDLCALC, TRIG, CHOLHDL, LDLDIRECT in the last 72 hours. Thyroid  Function Tests: No results for input(s): TSH, T4TOTAL, FREET4, T3FREE, THYROIDAB in the last 72 hours. Anemia Panel: No results for input(s): VITAMINB12, FOLATE, FERRITIN, TIBC, IRON, RETICCTPCT in the last 72 hours. Urine analysis:    Component Value Date/Time   COLORURINE YELLOW (A) 01/14/2024 2153   APPEARANCEUR CLOUDY (A) 01/14/2024 2153   LABSPEC 1.018 01/14/2024 2153   PHURINE 9.0 (H) 01/14/2024 2153   GLUCOSEU 150 (A) 01/14/2024 2153   HGBUR NEGATIVE 01/14/2024 2153   BILIRUBINUR NEGATIVE 01/14/2024 2153   KETONESUR NEGATIVE  01/14/2024 2153   PROTEINUR >=300 (A) 01/14/2024 2153   NITRITE NEGATIVE 01/14/2024 2153   LEUKOCYTESUR TRACE (A) 01/14/2024 2153   Sepsis Labs: @LABRCNTIP (procalcitonin:4,lacticidven:4) )No results found for this or any previous visit (from the past 240 hours).   Radiological Exams on Admission:   Assessment/Plan Principal Problem:   Acute cholecystitis Active Problems:   Sepsis (HCC)   Essential hypertension   Syncope   Hypertensive urgency   CAD (coronary artery disease)   Myocardial injury   Hyperlipidemia   Pulmonary nodules   ESRD on dialysis (HCC)   Hyperkalemia   Esophageal thickening   Type II diabetes mellitus with renal manifestations (HCC)   DVT (deep venous thrombosis) (HCC)   Penetrating ulcer of aorta (HCC)   Assessment and Plan:  Sepsis due to acute cholecystitis: pt meets criteria for sepsis with fever 101.4, heart rate up to 110, RR up to 30.  Lactic acid 1.0 --> 1.4.  Consulted Dr. Marinda of surgery.  -Admitted to PCU as inpatient - IV Rocephin  and Flagyl  - Blood culture - IV fluid: 50 cc/h of normal saline (patient has ESRD)  Essential hypertension and hypertensive urgency:  After dialysis Cozaar  blood pressure improved. -IV hydralazine  as needed - Coreg , Cozaar   Syncope: Likely multifactorial etiology, including hypertensive urgency, sepsis.  CT head negative.  No focal neurodeficit on physical examination. - Check orthostatic vital signs - Gentle IV fluid as above - Frequent neurocheck - Fall precaution  CAD (coronary artery disease) and Myocardial injury: Troponin 126 --> 117. - Aspirin , Lipitor   Hyperlipidemia -Lipitor   Pulmonary nodules: This is incidental findings by CTA - Follow up with PCP as outpatient  ESRD on dialysis (MWF): Patient is s/p of dialysis today -Consulted Dr. Marcelino  Hyperkalemia: K 5.2 -S/p of  dialysis  Esophageal thickening: This is chronic issue. -Patient need to follow-up with PCP and GI as outpatient  workup  Type II diabetes mellitus with renal manifestations [facility]): Recent A1c 5.2, well-controlled.  Patient is taking NovoLog  -Sliding scale insulin   DVT (deep venous thrombosis) (HCC) -Switched Eliquis  to IV heparin  for possible  surgery  Penetrating ulcer of aorta [facility]): CTA redemonstrated small penetrating aortic ulcer at the origin of the left subclavian artery, grossly stable. -need to follow-up with PCP and vascular surgeon as outpatient        DVT ppx: on IV heparin   Code Status: Full code   Family Communication:  Yes, patient's daughter at bed side.   Disposition Plan:  Anticipate discharge back to previous environment  Consults called:  Dr. Marinda of surgery, Dr. Marcelino of renal  Admission status and Level of care: Progressive:   as inpt        Dispo: The patient is from: Home              Anticipated d/c is to: Home              Anticipated d/c date is: 2 days              Patient currently is not medically stable to d/c.    Severity of Illness:  The appropriate patient status for this patient is INPATIENT. Inpatient status is judged to be reasonable and necessary in order to provide the required intensity of service to ensure the patient's safety. The patient's presenting symptoms, physical exam findings, and initial radiographic and laboratory data in the context of their chronic comorbidities is felt to place them at high risk for further clinical deterioration. Furthermore, it is not anticipated that the patient will be medically stable for discharge from the hospital within 2 midnights of admission.   * I certify that at the point of admission it is my clinical judgment that the patient will require inpatient hospital care spanning beyond 2 midnights from the point of admission due to high intensity of service, high risk for further deterioration and high frequency of surveillance required.*       Date of Service 01/15/2024    [facility]   If 7PM-7AM, please contact night-coverage [email or web address] 01/15/2024, 1:16 AM

## 2024-01-14 NOTE — Progress Notes (Signed)
 CODE SEPSIS - PHARMACY COMMUNICATION  **Broad Spectrum Antibiotics should be administered within 1 hour of Sepsis diagnosis**  Time Code Sepsis Called/Page Received: 2253  Antibiotics Ordered: Ceftriaxone  & Flagyl  Time of 1st antibiotic administration: 2316  Rankin CANDIE Dills, PharmD, [facility] 01/14/2024 10:54 PM

## 2024-01-14 NOTE — Progress Notes (Signed)
 Hemodialysis Note:  Received patient in bed to unit. Alert and oriented. Informed consent singed and in chart.  Treatment initiated: 1338 Treatment completed: 1703  Access used: Left AVG Access issues: None  100 ml saline bolus given due to Hypotension. Transported back to room, alert without acute distress. Report given to patient's RN.  Total UF removed: 1.4 liters Medications given: Hydralazine  10 mg IV  Post HD weight: unable to get weight, bed scale not working  [facility]

## 2024-01-14 NOTE — ED Triage Notes (Signed)
 Patient brought in via [place] from home. Patient states she has not felt well since about 5pm last night. She tried to lay down but has been up and down not feeling well. Patient did have witnessed syncopal episode in front of EMS, was awake within a minute or two, followed by a vomiting episode. Patient states she did have dizziness, chest pain and rib pain.

## 2024-01-14 NOTE — Sepsis Progress Note (Signed)
 Elink monitoring for the code sepsis protocol.

## 2024-01-15 ENCOUNTER — Encounter: Payer: Self-pay | Admitting: Internal Medicine

## 2024-01-15 DIAGNOSIS — I82409 Acute embolism and thrombosis of unspecified deep veins of unspecified lower extremity: Secondary | ICD-10-CM | POA: Diagnosis present

## 2024-01-15 DIAGNOSIS — K810 Acute cholecystitis: Secondary | ICD-10-CM | POA: Diagnosis not present

## 2024-01-15 DIAGNOSIS — A419 Sepsis, unspecified organism: Secondary | ICD-10-CM | POA: Diagnosis not present

## 2024-01-15 DIAGNOSIS — N186 End stage renal disease: Secondary | ICD-10-CM | POA: Diagnosis not present

## 2024-01-15 DIAGNOSIS — Z992 Dependence on renal dialysis: Secondary | ICD-10-CM | POA: Diagnosis not present

## 2024-01-15 DIAGNOSIS — E041 Nontoxic single thyroid nodule: Secondary | ICD-10-CM | POA: Insufficient documentation

## 2024-01-15 LAB — APTT
aPTT: 36 s (ref 24–36)
aPTT: 106 s — ABNORMAL HIGH (ref 24–36)
aPTT: 85 s — ABNORMAL HIGH (ref 24–36)

## 2024-01-15 LAB — BASIC METABOLIC PANEL WITH GFR
Anion gap: 14 (ref 5–15)
BUN: 31 mg/dL — ABNORMAL HIGH (ref 8–23)
CO2: 27 mmol/L (ref 22–32)
Calcium: 8.8 mg/dL — ABNORMAL LOW (ref 8.9–10.3)
Chloride: 94 mmol/L — ABNORMAL LOW (ref 98–111)
Creatinine, Ser: 5.29 mg/dL — ABNORMAL HIGH (ref 0.44–1.00)
GFR, Estimated: 8 mL/min — ABNORMAL LOW (ref >60)
Glucose, Bld: 204 mg/dL — ABNORMAL HIGH (ref 70–99)
Potassium: 4 mmol/L (ref 3.5–5.1)
Sodium: 135 mmol/L (ref 135–145)

## 2024-01-15 LAB — CBC
HCT: 34.8 % — ABNORMAL LOW (ref 36.0–46.0)
Hemoglobin: 10.6 g/dL — ABNORMAL LOW (ref 12.0–15.0)
MCH: 29.2 pg (ref 26.0–34.0)
MCHC: 30.5 g/dL (ref 30.0–36.0)
MCV: 95.9 fL (ref 80.0–100.0)
Platelets: 170 K/uL (ref 150–400)
RBC: 3.63 MIL/uL — ABNORMAL LOW (ref 3.87–5.11)
RDW: 15.6 % — ABNORMAL HIGH (ref 11.5–15.5)
WBC: 13.2 K/uL — ABNORMAL HIGH (ref 4.0–10.5)
nRBC: 0 % (ref 0.0–0.2)

## 2024-01-15 LAB — HIV ANTIBODY (ROUTINE TESTING W REFLEX): HIV Screen 4th Generation wRfx: NONREACTIVE

## 2024-01-15 LAB — TYPE AND SCREEN
ABO/RH(D): B NEG
Antibody Screen: NEGATIVE

## 2024-01-15 LAB — GLUCOSE, CAPILLARY
Glucose-Capillary: 193 mg/dL — ABNORMAL HIGH (ref 70–99)
Glucose-Capillary: 121 mg/dL — ABNORMAL HIGH (ref 70–99)
Glucose-Capillary: 112 mg/dL — ABNORMAL HIGH (ref 70–99)
Glucose-Capillary: 71 mg/dL (ref 70–99)
Glucose-Capillary: 90 mg/dL (ref 70–99)
Glucose-Capillary: 89 mg/dL (ref 70–99)

## 2024-01-15 LAB — LACTIC ACID, PLASMA
Lactic Acid, Venous: 1.9 mmol/L (ref 0.5–1.9)
Lactic Acid, Venous: 0.9 mmol/L (ref 0.5–1.9)

## 2024-01-15 LAB — TROPONIN I (HIGH SENSITIVITY)
Troponin I (High Sensitivity): 141 ng/L (ref <18)
Troponin I (High Sensitivity): 146 ng/L (ref <18)
Troponin I (High Sensitivity): 142 ng/L (ref <18)

## 2024-01-15 LAB — PROTIME-INR
INR: 1.7 — ABNORMAL HIGH (ref 0.8–1.2)
Prothrombin Time: 20.5 s — ABNORMAL HIGH (ref 11.4–15.2)

## 2024-01-15 LAB — HEPARIN LEVEL (UNFRACTIONATED): Heparin Unfractionated: 0.45 [IU]/mL (ref 0.30–0.70)

## 2024-01-15 MED ORDER — CARVEDILOL 25 MG PO TABS
25.0000 mg | ORAL_TABLET | Freq: Two times a day (BID) | ORAL | Status: DC
  Administered 2024-01-15 – 2024-01-18 (×8): 25 mg via ORAL
  Filled 2024-01-15 (×8): qty 1

## 2024-01-15 MED ORDER — TRAZODONE HCL 50 MG PO TABS
50.0000 mg | ORAL_TABLET | Freq: Every evening | ORAL | Status: DC | PRN
  Administered 2024-01-15 – 2024-01-16 (×2): 50 mg via ORAL
  Filled 2024-01-15 (×2): qty 1

## 2024-01-15 MED ORDER — DEXTROSE-SODIUM CHLORIDE 5-0.9 % IV SOLN: INTRAVENOUS | Status: AC

## 2024-01-15 MED ORDER — LOSARTAN POTASSIUM 25 MG PO TABS
25.0000 mg | ORAL_TABLET | Freq: Every day | ORAL | Status: DC
  Administered 2024-01-15 – 2024-01-16 (×2): 25 mg via ORAL
  Filled 2024-01-15 (×2): qty 1

## 2024-01-15 MED ORDER — LIDOCAINE 5 % EX PTCH
1.0000 | MEDICATED_PATCH | Freq: Every day | CUTANEOUS | Status: DC
  Administered 2024-01-15: 1 via TRANSDERMAL
  Filled 2024-01-15: qty 1

## 2024-01-15 MED ORDER — METRONIDAZOLE 500 MG/100ML IV SOLN
500.0000 mg | Freq: Two times a day (BID) | INTRAVENOUS | Status: DC
  Administered 2024-01-15 – 2024-01-18 (×7): 500 mg via INTRAVENOUS
  Filled 2024-01-15 (×8): qty 100

## 2024-01-15 MED ORDER — ATORVASTATIN CALCIUM 80 MG PO TABS
80.0000 mg | ORAL_TABLET | Freq: Every day | ORAL | Status: DC
  Administered 2024-01-15 – 2024-01-17 (×3): 80 mg via ORAL
  Filled 2024-01-15 (×3): qty 1

## 2024-01-15 MED ORDER — ASPIRIN 81 MG PO TBEC
81.0000 mg | DELAYED_RELEASE_TABLET | Freq: Every day | ORAL | Status: DC
  Administered 2024-01-16 – 2024-01-18 (×3): 81 mg via ORAL
  Filled 2024-01-15 (×5): qty 1

## 2024-01-15 MED ORDER — HEPARIN (PORCINE) 25000 UT/250ML-% IV SOLN
850.0000 [IU]/h | INTRAVENOUS | Status: DC
  Administered 2024-01-15: 900 [IU]/h via INTRAVENOUS
  Administered 2024-01-16 – 2024-01-17 (×2): 850 [IU]/h via INTRAVENOUS
  Filled 2024-01-15 (×3): qty 250

## 2024-01-15 MED ORDER — SODIUM CHLORIDE 0.9 % IV SOLN
2.0000 g | INTRAVENOUS | Status: DC
  Administered 2024-01-15 – 2024-01-17 (×3): 2 g via INTRAVENOUS
  Filled 2024-01-15 (×4): qty 20

## 2024-01-15 MED ORDER — PANTOPRAZOLE SODIUM 40 MG IV SOLR
40.0000 mg | Freq: Every day | INTRAVENOUS | Status: DC
  Administered 2024-01-15 – 2024-01-18 (×4): 40 mg via INTRAVENOUS
  Filled 2024-01-15 (×4): qty 10

## 2024-01-15 MED ORDER — RENA-VITE PO TABS
1.0000 | ORAL_TABLET | Freq: Every day | ORAL | Status: DC
  Administered 2024-01-15 – 2024-01-17 (×3): 1 via ORAL
  Filled 2024-01-15 (×4): qty 1

## 2024-01-15 MED ORDER — SODIUM CHLORIDE 0.9 % IV SOLN: INTRAVENOUS | Status: DC

## 2024-01-15 NOTE — Plan of Care (Signed)
  Problem: Education: Goal: Ability to describe self-care measures that may prevent or decrease complications (Diabetes Survival Skills Education) will improve Outcome: Progressing   Problem: Fluid Volume: Goal: Ability to maintain a balanced intake and output will improve Outcome: Progressing   Problem: Tissue Perfusion: Goal: Adequacy of tissue perfusion will improve Outcome: Progressing   Problem: Clinical Measurements: Goal: Ability to maintain clinical measurements within normal limits will improve Outcome: Progressing   Problem: Clinical Measurements: Goal: Will remain free from infection Outcome: Progressing   Problem: Clinical Measurements: Goal: Diagnostic test results will improve Outcome: Progressing

## 2024-01-15 NOTE — Hospital Course (Signed)
 Christy Curry is a 68 y.o. female with medical history significant of ESRD-HD (MWF), aortic stenosis (s/p of TAVR), DVT on Eliquis , HTN,  HLD, DM, CAD, dCHF, anemia, esophageal thickening, who presents with nausea, vomiting, abdominal pain, fever, dizziness, syncope.  Upon arriving to hospital, patient had temperature of 101.4, heart rate 110, respirate 30.  Lactic acid now normal.  CT scan showed gallstones with cholecystitis.  Patient seen by general surgery, started on Rocephin  and Flagyl .

## 2024-01-15 NOTE — Progress Notes (Signed)
 ANTICOAGULATION CONSULT NOTE  Pharmacy Consult for heparin  infusion Indication: DVT  Allergies  Allergen Reactions   2,4-D Dimethylamine Swelling   Other Swelling   Tetanus Antitoxin Itching   Tetanus Toxoid    Tetanus Toxoids Itching    Patient Measurements: Height: 5' 1 (154.9 cm) Weight: 56.2 kg (124 lb) IBW/kg (Calculated) : 47.8 HEPARIN  DW (KG): 56.2  Vital Signs: Temp: 99.9 F (37.7 C) (07/25 2147) Temp Source: Oral (07/25 2147) BP: 149/62 (07/26 0024) Pulse Rate: 98 (07/26 0024)  Labs: Recent Labs    01/14/24 0426 01/14/24 0615  HGB 10.7*  --   HCT 36.3  --   PLT 180  --   CREATININE 7.21*  --   TROPONINIHS 126* 117*    Estimated Creatinine Clearance: 5.6 mL/min (A) (by C-G formula based on SCr of 7.21 mg/dL (H)).   Medical History: Past Medical History:  Diagnosis Date   Anemia of chronic disease    Aortic stenosis    Diabetes mellitus with complication (HCC)    Diabetic angiopathy (HCC)    Diabetic retinopathy (HCC)    ESRD (end stage renal disease) (HCC)    HLD (hyperlipidemia)    Hypertension    RBBB     Medications:  PTA Meds: Eliquis  5 mg BID, last dose 01/14/24 in AM  Assessment: Pt is a 68 yo female with hx of DVT on Eliquis  presenting to ED, now has cholecystisis, may need procedure.   Goal of Therapy:  Heparin  level 0.3-0.7 units/ml aPTT 66-102 seconds Monitor platelets by anticoagulation protocol: Yes   Plan:  No initial bolus Start heparin  infusion at 900 units/hr Will follow aPTT until correlation w/ HL confirmed Will check aPTT in 8 hr after start of infusion HL & CBC daily while on heparin   Rankin CANDIE Dills, PharmD, [facility] 01/15/2024 12:49 AM

## 2024-01-15 NOTE — Progress Notes (Signed)
 ANTICOAGULATION CONSULT NOTE  Pharmacy Consult for heparin  infusion Indication: DVT  Allergies  Allergen Reactions   2,4-D Dimethylamine Swelling   Other Swelling   Tetanus Antitoxin Itching   Tetanus Toxoid    Tetanus Toxoids Itching    Patient Measurements: Height: 5' 1 (154.9 cm) Weight: 53.4 kg (117 lb 11.6 oz) IBW/kg (Calculated) : 47.8 HEPARIN  DW (KG): 56.2  Vital Signs: Temp: 98.7 F (37.1 C) (07/26 1133) Temp Source: Oral (07/26 1133) BP: 127/107 (07/26 1133) Pulse Rate: 77 (07/26 1133)  Labs: Recent Labs    01/14/24 0426 01/14/24 0615 01/15/24 0203 01/15/24 0604 01/15/24 0836 01/15/24 1055  HGB 10.7*  --  10.6*  --   --   --   HCT 36.3  --  34.8*  --   --   --   PLT 180  --  170  --   --   --   APTT  --   --  36  --   --  106*  LABPROT  --   --  20.5*  --   --   --   INR  --   --  1.7*  --   --   --   HEPARINUNFRC  --   --  0.45  --   --   --   CREATININE 7.21*  --  5.29*  --   --   --   TROPONINIHS 126*   < > 141* 146* 142*  --    < > = values in this interval not displayed.    Estimated Creatinine Clearance: 7.6 mL/min (A) (by C-G formula based on SCr of 5.29 mg/dL (H)).   Medical History: Past Medical History:  Diagnosis Date   Anemia of chronic disease    Aortic stenosis    Diabetes mellitus with complication (HCC)    Diabetic angiopathy (HCC)    Diabetic retinopathy (HCC)    ESRD (end stage renal disease) (HCC)    HLD (hyperlipidemia)    Hypertension    RBBB     Medications:  PTA Meds: Eliquis  5 mg BID, last dose 01/14/24 in AM  Assessment: Pt is a 68 yo female with hx of DVT on Eliquis  presenting to ED, now has cholecystisis, may need procedure.   7/26 1055 aPTT 106, slightly Supratherapeutic    Goal of Therapy:  Heparin  level 0.3-0.7 units/ml aPTT 66-102 seconds Monitor platelets by anticoagulation protocol: Yes   Plan:  7/26 1055  aPTT 106, slightly Supratherapeutic Decrease heparin  infusion to 850 units/hr Will follow  aPTT until correlation w/ HL confirmed Will check aPTT in 8 hr after rate change HL & CBC daily while on heparin   Allean Haas PharmD Clinical Pharmacist 01/15/2024

## 2024-01-15 NOTE — Sepsis Progress Note (Signed)
 Notified bedside nurse of need to draw lactic acid.

## 2024-01-15 NOTE — Consult Note (Signed)
 Patient ID: Christy Curry, female   DOB: 11-22-1955, 68 y.o.   MRN: [id number] CC: Acute Cholecystitis History of Present Illness Christy Curry is a 68 y.o. female with past medical history significant for end-stage renal disease on dialysis Monday Wednesday Friday through left upper extremity AV graft, PE on eliquis , aortic stenosis, coronary artery disease, diabetes and hypertension who presents in consultation for acute cholecystitis.  The patient reports that over the last few days she has had nausea and vomiting.  She reports that there is been multiple episodes of nonbilious nonbloody vomiting.  She also reports that she has epigastric and right upper quadrant abdominal pain.  She says that she has had fevers and chills as well.  She also reports that during this time her pressure has been systolic over 200.  She does report a little bit of a headache but no neurologic changes.  She came into the emergency department and underwent dialysis.  She then had a CT scan that was concerning for acute cholecystitis.   Exam today she reports that she feels a little bit better.  She says that her pain is slightly improved.  She denies any further nausea or vomiting but does feel little bit bloated. Past Medical History Past Medical History:  Diagnosis Date   Anemia of chronic disease    Aortic stenosis    Diabetes mellitus with complication (HCC)    Diabetic angiopathy (HCC)    Diabetic retinopathy (HCC)    ESRD (end stage renal disease) (HCC)    HLD (hyperlipidemia)    Hypertension    RBBB     Past Surgical History:  Procedure Laterality Date   av fistula Left     Allergies  Allergen Reactions   2,4-D Dimethylamine Swelling   Other Swelling   Tetanus Antitoxin Itching   Tetanus Toxoid    Tetanus Toxoids Itching    Current Facility-Administered Medications  Medication Dose Route Frequency Provider Last Rate Last Admin   0.9 %  sodium chloride  infusion   Intravenous  Continuous Zhang, Dekui, MD 50 mL/hr at 01/15/24 1201 New Bag at 01/15/24 1201   acetaminophen  (TYLENOL ) tablet 650 mg  650 mg Oral Q6H PRN Niu, Xilin, MD       albuterol  (PROVENTIL ) (2.5 MG/3ML) 0.083% nebulizer solution 2.5 mg  2.5 mg Nebulization Q4H PRN Dail Rankin RAMAN, RPH       aspirin  EC tablet 81 mg  81 mg Oral Daily Niu, Xilin, MD       atorvastatin  (LIPITOR ) tablet 80 mg  80 mg Oral QHS Niu, Xilin, MD       carvedilol  (COREG ) tablet 25 mg  25 mg Oral BID WC Niu, Xilin, MD   25 mg at 01/15/24 9146   cefTRIAXone  (ROCEPHIN ) 2 g in sodium chloride  0.9 % 100 mL IVPB  2 g Intravenous Q24H Niu, Xilin, MD       Chlorhexidine  Gluconate Cloth 2 % PADS 6 each  6 each Topical Q0600 Breeze, Shantelle, NP   6 each at 01/15/24 0541   dextromethorphan-guaiFENesin  (MUCINEX  DM) 30-600 MG per 12 hr tablet 1 tablet  1 tablet Oral BID PRN Niu, Xilin, MD       heparin  ADULT infusion 100 units/mL (25000 units/250mL)  850 Units/hr Intravenous Continuous Suzann Laurel A, RPH 8.5 mL/hr at 01/15/24 1248 850 Units/hr at 01/15/24 1248   hydrALAZINE  (APRESOLINE ) injection 10 mg  10 mg Intravenous Q2H PRN Niu, Xilin, MD       insulin  aspart (novoLOG )  injection 0-5 Units  0-5 Units Subcutaneous QHS Niu, Xilin, MD       insulin  aspart (novoLOG ) injection 0-9 Units  0-9 Units Subcutaneous TID WC Niu, Xilin, MD       lidocaine  (LIDODERM ) 5 % 1 patch  1 patch Transdermal QHS Dail Rankin RAMAN, RPH       losartan  (COZAAR ) tablet 25 mg  25 mg Oral Daily Niu, Xilin, MD   25 mg at 01/15/24 0853   metroNIDAZOLE  (FLAGYL ) IVPB 500 mg  500 mg Intravenous Q12H Niu, Xilin, MD 100 mL/hr at 01/15/24 0917 500 mg at 01/15/24 9082   morphine  (PF) 2 MG/ML injection 2 mg  2 mg Intravenous Q3H PRN Niu, Xilin, MD       multivitamin (RENA-VIT) tablet 1 tablet  1 tablet Oral QHS Niu, Xilin, MD       ondansetron  (ZOFRAN ) injection 4 mg  4 mg Intravenous Once Viviann Pastor, MD       ondansetron  (ZOFRAN ) injection 4 mg  4 mg Intravenous Q8H  PRN Niu, Xilin, MD       oxyCODONE -acetaminophen  (PERCOCET/ROXICET) 5-325 MG per tablet 1 tablet  1 tablet Oral Q4H PRN Niu, Xilin, MD   1 tablet at 01/15/24 1305   pantoprazole  (PROTONIX ) injection 40 mg  40 mg Intravenous Daily Niu, Xilin, MD   40 mg at 01/15/24 9145   traZODone  (DESYREL ) tablet 50 mg  50 mg Oral QHS PRN Niu, Xilin, MD        Family History Family History  Problem Relation Age of Onset   Kidney disease Mother    Diabetes Father    Peripheral Artery Disease Daughter 73       bilateral amputation       Social History Social History   Tobacco Use   Smoking status: Never   Smokeless tobacco: Never  Vaping Use   Vaping status: Never Used  Substance Use Topics   Alcohol use: Never   Drug use: Never        ROS Full ROS of systems performed and is otherwise negative there than what is stated in the HPI  Physical Exam Blood pressure (!) 127/107, pulse 77, temperature 98.7 F (37.1 C), temperature source Oral, resp. rate 15, height 5' 1 (1.549 m), weight 53.4 kg, last menstrual period 06/22/2016, SpO2 100%.  Alert and oriented x 3, normal work of breathing on room air, abdomen is soft, tender in the right upper quadrant and epigastrium without any rebound tenderness and negative Murphy sign, AV graft in the left upper extremity, no swelling to the bilateral lower extremities, moving all extremities spontaneously Data Reviewed Labs independently reviewed she has a leukocytosis to 13,000, CMP notable for normal total bilirubin.  She does have a troponin leak to the low 100s.  Does not have a lactic acidosis.  CT scan reviewed.  In her distal stomach there is a hyperenhancing mass.  Her gallbladder is slightly distended and there is fluid around her gallbladder.  There are stones within the gallbladder lumen.  I have personally reviewed the patient's imaging and medical records.    Assessment    68 year old female with a myriad of medical problems including  pulmonary embolism on anticoagulation, aortic stenosis and stage renal disease who presents with nausea and vomiting and right upper quadrant pain.  CT scan concerning for cholecystitis.  Plan    Overall patient looks to be improving slightly on exam and subjectively today.  I discussed with her that given her medical problems  I do not think it would be appropriate to entertain operative intervention at this time.  I am hopeful that we can try to just treat this with antibiotics.  However, if she worsens clinically or she has a change in her vital signs then she will need IR consult for cholecystostomy tube.  However, at this time again I am hopeful that we can treat this episode of cholecystitis with antibiotics.  As far as her hyperenhancement mass in the gastric antrum she will need an EGD but this can be done on an outpatient basis.  A total of 60 minutes was spent reviewing the patient's chart, performing a history and physical and discussing treatment options with the patient  Jayson MALVA Endow 01/15/2024, 1:17 PM

## 2024-01-15 NOTE — Sepsis Progress Note (Signed)
 Per Bedside RN antibiotics given before cultures collected due to patient being a hard stick.

## 2024-01-15 NOTE — Care Management Important Message (Signed)
 Important Message  Patient Details  Name: Christy Curry MRN: [id number] Date of Birth: [birth year]   Important Message Given:  Yes - Medicare IM     Rojelio [name] 01/15/2024, 1:40 PM

## 2024-01-15 NOTE — Progress Notes (Signed)
 Progress Note   Patient: Christy Curry FMW:[id number] DOB: 1955-11-08 DOA: 01/14/2024     1 DOS: the patient was seen and examined on 01/15/2024   Brief hospital course: Christy Curry is a 68 y.o. female with medical history significant of ESRD-HD (MWF), aortic stenosis (s/p of TAVR), DVT on Eliquis , HTN,  HLD, DM, CAD, dCHF, anemia, esophageal thickening, who presents with nausea, vomiting, abdominal pain, fever, dizziness, syncope.  Upon arriving to hospital, patient had temperature of 101.4, heart rate 110, respirate 30.  Lactic acid now normal.  CT scan showed gallstones with cholecystitis.  Patient seen by general surgery, started on Rocephin  and Flagyl .   Principal Problem:   Acute cholecystitis Active Problems:   Sepsis (HCC)   Essential hypertension   Syncope   Hypertensive urgency   CAD (coronary artery disease)   Myocardial injury   Hyperlipidemia   Pulmonary nodules   ESRD on dialysis (HCC)   Hyperkalemia   Esophageal thickening   Type II diabetes mellitus with renal manifestations (HCC)   DVT (deep venous thrombosis) (HCC)   Penetrating ulcer of aorta (HCC)   Thyroid  nodule   Assessment and Plan: Sepsis due to acute cholecystitis Gallstone with cholecystitis.:  pt meets criteria for sepsis with fever 101.4, heart rate up to 110, RR up to 30.  Lactic acid 1.0 --> 1.4.   Patient seen by general surgery, decision was made to treat medically first.  If condition does not improve, may consider cholecystostomy. Patient condition appears to be improving, no additional fever.  Abdominal pain is better.  Will continue Rocephin  and Flagyl . Patient still n.p.o., restart IV fluids at 50 mL/h due to end-stage renal disease.   Essential hypertension and hypertensive urgency:   Resume home medicines.   Syncope: Likely multifactorial etiology, including hypertensive urgency, sepsis.  CT head negative.  No focal neurodeficit on physical examination. Continue to  monitor.   CAD (coronary artery disease) and Myocardial injury: Troponin 126 --> 117. - Aspirin , Lipitor  No concern for ACS.   Hyperlipidemia -Lipitor    Pulmonary nodules: This is incidental findings by CTA - Follow up with PCP as outpatient   ESRD on dialysis (MWF) Hyperkalemia secondary to end-stage renal disease. : Patient is s/p of dialysis Friday Will be followed by nephrology. Potassium has normalized.   Esophageal thickening: This is chronic issue. -Patient need to follow-up with PCP and GI as outpatient workup   Type II diabetes mellitus with renal manifestations [facility]): Recent A1c 5.2, well-controlled.  Patient is taking NovoLog  -Sliding scale insulin    DVT (deep venous thrombosis) (HCC) -Switched Eliquis  to IV heparin  for possible surgery   Penetrating ulcer of aorta (HCC): CTA redemonstrated small penetrating aortic ulcer at the origin of the left subclavian artery, grossly stable. -need to follow-up with PCP and vascular surgeon as outpatient          Subjective:  Patient no better today, no significant abdominal pain.  No nausea vomiting.  Physical Exam: Vitals:   01/15/24 0200 01/15/24 0517 01/15/24 0804 01/15/24 0922  BP:  104/62 (!) 175/66 (!) 141/65  Pulse:  100 100   Resp:  18 16   Temp:  (!) 97.1 F (36.2 C) 99.5 F (37.5 C)   TempSrc:   Oral   SpO2:  93% 96%   Weight: 53.4 kg 53.4 kg    Height:       General exam: Appears calm and comfortable  Respiratory system: Clear to auscultation. Respiratory effort normal. Cardiovascular system: S1 &  S2 heard, RRR. No JVD, murmurs, rubs, gallops or clicks. No pedal edema. Gastrointestinal system: Abdomen is RUQ distended, soft and nontender. No organomegaly or masses felt. Normal bowel sounds heard. Central nervous system: Alert and oriented. No focal neurological deficits. Extremities: Symmetric 5 x 5 power. Skin: No rashes, lesions or ulcers Psychiatry: Judgement and insight appear normal. Mood &  affect appropriate.    Data Reviewed:  Reviewed CT scan results and lab results.  Family Communication: Daughter updated at bedside  Disposition: Status is: Inpatient Remains inpatient appropriate because: Severity of disease, IV antibiotics.     Time spent: 55 minutes  Author: Murvin Mana, MD 01/15/2024 11:19 AM  For on call review [email or web address].

## 2024-01-15 NOTE — Progress Notes (Signed)
 . [facility]  ROUNDING NOTE   Subjective:   Patient well-known to us  as we follow her for outpatient hemodialysis treatments. She came in with presyncopal symptoms as well as nausea and vomiting.  Patient is known to our practice and receives outpatient dialysis treatments at [facility] on a MWF schedule, supervised by Dr. Marcelino.  Patient seen resting in bed, daughter at bedside Currently n.p.o. IV fluids infusing at 50 mL/h Continues to have nausea, denies vomiting or diarrhea   Objective:  Vital signs in last 24 hours:  Temp:  [97.1 F (36.2 C)-101.4 F (38.6 C)] 98.7 F (37.1 C) (07/26 1133) Pulse Rate:  [77-110] 77 (07/26 1133) Resp:  [15-23] 15 (07/26 1133) BP: (101-196)/(42-107) 127/107 (07/26 1133) SpO2:  [92 %-100 %] 100 % (07/26 1133) Weight:  [53.4 kg] 53.4 kg (07/26 0517)  Weight change: -2.846 kg Filed Weights   01/14/24 0423 01/15/24 0200 01/15/24 0517  Weight: 56.2 kg 53.4 kg 53.4 kg    Intake/Output: I/O last 3 completed shifts: In: 258.1 [I.V.:78.5; IV Piggyback:179.6] Out: 1400 [Other:1400]   Intake/Output this shift:  No intake/output data recorded.  Physical Exam: General: No acute distress, frail  Head: Normocephalic, atraumatic. Moist oral mucosal membranes  Neck: Supple  Lungs:  Diminished  Heart: S1S2 no rubs  Abdomen:  Soft, nontender, bowel sounds present  Extremities: Trace peripheral edema.  Neurologic: Awake, alert, following commands  Skin: No acute rash  Access: Left upper extremity AV graft    Basic Metabolic Panel: Recent Labs  Lab 01/14/24 0426 01/15/24 0203  NA 142 135  K 5.2* 4.0  CL 102 94*  CO2 29 27  GLUCOSE 159* 204*  BUN 45* 31*  CREATININE 7.21* 5.29*  CALCIUM  9.4 8.8*    Liver Function Tests: Recent Labs  Lab 01/14/24 0426  AST 35  ALT 12  ALKPHOS 114  BILITOT 1.0  PROT 6.1*  ALBUMIN 3.2*   No results for input(s): LIPASE, AMYLASE in the last 168 hours. No results for  input(s): AMMONIA in the last 168 hours.  CBC: Recent Labs  Lab 01/14/24 0426 01/15/24 0203  WBC 10.2 13.2*  NEUTROABS 8.1*  --   HGB 10.7* 10.6*  HCT 36.3 34.8*  MCV 98.4 95.9  PLT 180 170    Cardiac Enzymes: No results for input(s): CKTOTAL, CKMB, CKMBINDEX, TROPONINI in the last 168 hours.  BNP: Invalid input(s): POCBNP  CBG: Recent Labs  Lab 01/15/24 0114 01/15/24 0913  GLUCAP 193* 121*    Microbiology: Results for orders placed or performed during the hospital encounter of 01/14/24  Blood Culture (routine x 2)     Status: None (Preliminary result)   Collection Time: 01/14/24 11:32 PM   Specimen: BLOOD  Result Value Ref Range Status   Specimen Description BLOOD BLOOD RIGHT HAND  Final   Special Requests   Final    BOTTLES DRAWN AEROBIC AND ANAEROBIC Blood Culture adequate volume   Culture   Final    NO GROWTH < 12 HOURS Performed at [facility], [address], [place], [place] [address]    Report Status PENDING  Incomplete  Blood Culture (routine x 2)     Status: None (Preliminary result)   Collection Time: 01/14/24 11:32 PM   Specimen: BLOOD  Result Value Ref Range Status   Specimen Description BLOOD BLOOD RIGHT HAND  Final   Special Requests   Final    BOTTLES DRAWN AEROBIC AND ANAEROBIC Blood Culture results may not be optimal  due to an inadequate volume of blood received in culture bottles   Culture   Final    NO GROWTH < 12 HOURS Performed at [facility], [address] [place], [place] [address]    Report Status PENDING  Incomplete    Coagulation Studies: Recent Labs    01/15/24 0203  LABPROT 20.5*  INR 1.7*    Urinalysis: Recent Labs    01/14/24 2153  COLORURINE YELLOW*  LABSPEC 1.018  PHURINE 9.0*  GLUCOSEU 150*  HGBUR NEGATIVE  BILIRUBINUR NEGATIVE  KETONESUR NEGATIVE  PROTEINUR >=300*  NITRITE NEGATIVE  LEUKOCYTESUR TRACE*      Imaging: CT ABDOMEN PELVIS W CONTRAST Result Date:  01/14/2024 CLINICAL DATA:  Abdomen pain EXAM: CT ABDOMEN AND PELVIS WITH CONTRAST TECHNIQUE: Multidetector CT imaging of the abdomen and pelvis was performed using the standard protocol following bolus administration of intravenous contrast. RADIATION DOSE REDUCTION: This exam was performed according to the departmental dose-optimization program which includes automated exposure control, adjustment of the mA and/or kV according to patient size and/or use of iterative reconstruction technique. CONTRAST:  75mL OMNIPAQUE  IOHEXOL  350 MG/ML SOLN COMPARISON:  CT 11/25/2022, 07/06/2023 FINDINGS: Lower chest: Lung bases demonstrate no acute airspace disease. Cardiomegaly. Distal esophageal and GE junction thickening. Hepatobiliary: Distended gallbladder. Multiple stones at the gallbladder neck. Diffuse gallbladder wall thickening and or pericholecystic fluid. No biliary dilatation. Atrophic appearing left hepatic lobe as before. Pancreas: Unremarkable. No pancreatic ductal dilatation or surrounding inflammatory changes. Spleen: Normal in size without focal abnormality. Adrenals/Urinary Tract: Adrenal glands are within normal limits. Kidneys show no hydronephrosis. Cyst in the right kidney, no imaging follow-up is recommended. Delayed excretion of contrast. Contrast or hyperdense material in the urinary bladder. Stomach/Bowel: Stomach is decompressed but there may be diffuse gastric wall thickening. Focal lobulated hyperenhancing area at the gastric antrum measuring 15 by 14 by 16 mm on series 2, image 29 and coronal series 6, image 16. There is no dilated small bowel. Negative appendix. No acute bowel wall thickening Vascular/Lymphatic: Advanced aortic atherosclerosis. No aneurysm. No suspicious lymph nodes Reproductive: No suspicious adnexal mass.  Probable uterine fibroids Other: Negative for pelvic effusion or free air Musculoskeletal: No acute or suspicious osseous abnormality. Multilevel Schmorl's nodes IMPRESSION: 1.  Distended gallbladder with multiple stones at the gallbladder neck. Diffuse gallbladder wall thickening and or pericholecystic fluid. Imaging appearance is suspicious for an acute cholecystitis. Correlation with ultrasound is suggested. 2. Stomach is decompressed but there may be diffuse gastric wall thickening, question gastritis. Distal esophageal and GE junction thickening as well. Focal lobulated hyperenhancing area at the gastric antrum measuring 15 x 14 x 16 mm, possible enhancing mucosal lesion/mass. Recommend further evaluation with endoscopy. 3. Delayed excretion of contrast from the kidneys, correlate with appropriate lab values. There appears to be excreted contrast though within the urinary bladder (? Has the patient had recent contrast administration prior to today ) Aortic Atherosclerosis (ICD10-I70.0). Electronically Signed   By: Luke Bun M.D.   On: 01/14/2024 21:52   CT Angio Chest PE W and/or Wo Contrast Result Date: 01/14/2024 CLINICAL DATA:  Not feeling well syncope EXAM: CT ANGIOGRAPHY CHEST WITH CONTRAST TECHNIQUE: Multidetector CT imaging of the chest was performed using the standard protocol during bolus administration of intravenous contrast. Multiplanar CT image reconstructions and MIPs were obtained to evaluate the vascular anatomy. RADIATION DOSE REDUCTION: This exam was performed according to the departmental dose-optimization program which includes automated exposure control, adjustment of the mA and/or kV according to patient size  and/or use of iterative reconstruction technique. CONTRAST:  75mL OMNIPAQUE  IOHEXOL  350 MG/ML SOLN COMPARISON:  CT angiography 11/25/2022 FINDINGS: Cardiovascular: Satisfactory opacification of the pulmonary arteries to the segmental level. No evidence of pulmonary embolism. Moderate aortic atherosclerosis. No aneurysm or dissection. Common origin of the right brachiocephalic and left common carotid arteries. Redemonstrated small penetrating aortic  ulcer at the origin of the left subclavian artery, series 4, image 26, grossly stable. Suspect stenosis at the origin of the left vertebral artery. Status post TAVR. Coronary vascular calcification. Cardiomegaly. No pericardial effusion Mediastinum/Nodes: Patent trachea. Multiple thyroid  nodules measuring up to 1.5 cm. No suspicious lymph nodes. Esophagus shows hernia and prominent distal esophageal thickening and thickening at the GE junction. Lungs/Pleura: No acute airspace disease, pleural effusion or pneumothorax. Multiple small pulmonary nodules, largest is seen at the left apical lung measuring 5 mm series 5, image 25, previously 5 mm. Upper Abdomen: See separately dictated CT Musculoskeletal: No acute osseous abnormality Review of the MIP images confirms the above findings. IMPRESSION: 1. Negative for acute pulmonary embolus or aortic dissection. 2. Cardiomegaly with interval TAVR. 3. 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 4. Multiple small pulmonary nodules measuring up to 5 mm, stable. No specific imaging follow-up is recommended. 5. Small distal esophageal hernia with prominent distal esophageal thickening and thickening at the GE junction. Endoscopy follow-up if deemed appropriate. 6. Several small thyroid  nodules measuring up to 1.5 cm. Recommend thyroid  US  (ref: J Am Coll Radiol. 2015 Feb;12(2): 143-50).This should be performed non emergently 7. Aortic atherosclerosis. Aortic Atherosclerosis (ICD10-I70.0). Electronically Signed   By: Luke Bun M.D.   On: 01/14/2024 21:39   CT Head Wo Contrast Result Date: 01/14/2024 CLINICAL DATA:  Mental status change, unknown cause EXAM: CT HEAD WITHOUT CONTRAST TECHNIQUE: Contiguous axial images were obtained from the base of the skull through the vertex without intravenous contrast. RADIATION DOSE REDUCTION: This exam was performed according to the departmental dose-optimization program which  includes automated exposure control, adjustment of the mA and/or kV according to patient size and/or use of iterative reconstruction technique. COMPARISON:  None Available. FINDINGS: Brain: Normal anatomic configuration. No abnormal intra or extra-axial mass lesion or fluid collection. No abnormal mass effect or midline shift. No evidence of acute intracranial hemorrhage or infarct. Ventricular size is normal. Cerebellum unremarkable. Vascular: Unremarkable Skull: Intact Sinuses/Orbits: Paranasal sinuses are clear. Orbits are unremarkable. Other: Mastoid air cells and middle ear cavities are clear. IMPRESSION: 1. Normal CT examination of the brain. Electronically Signed   By: Dorethia Molt M.D.   On: 01/14/2024 19:16   DG Chest Port 1 View Result Date: 01/14/2024 CLINICAL DATA:  Dizziness, chest pain, syncopal episode. EXAM: PORTABLE CHEST 1 VIEW COMPARISON:  Portable chest 07/08/2023. FINDINGS: The heart is moderately enlarged. Interval TAVR. Stable mediastinum. The aorta is tortuous and calcified. There is central vascular prominence and mild flow cephalization but there is no overt edema. The lungs are clear of infiltrates. No pleural effusion is seen. A left axillary vascular stent is again noted. Advanced thoracic spondylosis. IMPRESSION: 1. Cardiomegaly with central vascular prominence and mild flow cephalization but no overt edema. 2. Interval TAVR. 3. Aortic atherosclerosis. Electronically Signed   By: Francis Quam M.D.   On: 01/14/2024 04:48     Medications:    sodium chloride  50 mL/hr at 01/15/24 1201   cefTRIAXone  (ROCEPHIN )  IV     heparin  900 Units/hr (01/15/24 0259)   metronidazole  500 mg (01/15/24  9082)    aspirin  EC  81 mg Oral Daily   atorvastatin   80 mg Oral QHS   carvedilol   25 mg Oral BID WC   Chlorhexidine  Gluconate Cloth  6 each Topical Q0600   insulin  aspart  0-5 Units Subcutaneous QHS   insulin  aspart  0-9 Units Subcutaneous TID WC   lidocaine   1 patch Transdermal QHS    losartan   25 mg Oral Daily   multivitamin  1 tablet Oral QHS   ondansetron  (ZOFRAN ) IV  4 mg Intravenous Once   pantoprazole   40 mg Intravenous Daily   acetaminophen , albuterol , dextromethorphan-guaiFENesin , hydrALAZINE , morphine  injection, ondansetron  (ZOFRAN ) IV, oxyCODONE -acetaminophen , traZODone   Assessment/ Plan:  68 y.o. female with past medical history of ESRD on HD, hypertension, diabetes mellitus type 2, DVT, aortic stenosis status post TAVR 08/19/2023, anemia of chronic disease, secondary hyperparathyroidism who presented with presyncopal symptoms and severe hypertension.  [facility]/MWF/left AVG  1.  ESRD on HD MWF.  Dialysis received yesterday with UF 1.4 L achieved.  Next treatment scheduled for Monday, if patient remains inpatient.  2.  Hypertension.  Systolic blood pressure was 220 upon admission.  Blood pressure today 127/107, currently prescribed carvedilol , losartan , and hydralazine  as needed.  3.  Anemia of chronic kidney disease.  Hemoglobin stable, 10.6.  Patient receives Mircera at outpatient clinic.  4.  Hyperkalemia.  Serum potassium corrected with dialysis, 4.0  5.  Septic, acute cholecystitis.  Currently receiving medical management per general surgery.  If condition does not improve, will consider cholecystectomy.   LOS: 1 [place] Christy Curry 7/26/202512:27 PM

## 2024-01-15 NOTE — Progress Notes (Signed)
 Patient arrieved at 0020 with daughter at bedside. Patient denies pain or discomfort at this time. Understands she is NPO at this time and she can not eat or drink anything. Call light and personal items within reach.

## 2024-01-15 NOTE — Progress Notes (Signed)
 ANTICOAGULATION CONSULT NOTE  Pharmacy Consult for heparin  infusion Indication: DVT  Allergies  Allergen Reactions   2,4-D Dimethylamine Swelling   Other Swelling   Tetanus Antitoxin Itching   Tetanus Toxoid    Tetanus Toxoids Itching   Patient Measurements: Height: 5' 1 (154.9 cm) Weight: 53.4 kg (117 lb 11.6 oz) IBW/kg (Calculated) : 47.8 HEPARIN  DW (KG): 56.2  Vital Signs: Temp: 98.9 F (37.2 C) (07/26 1550) Temp Source: Oral (07/26 1550) BP: 117/68 (07/26 1550) Pulse Rate: 86 (07/26 1550)  Labs: Recent Labs    01/14/24 0426 01/14/24 0615 01/15/24 0203 01/15/24 0604 01/15/24 0836 01/15/24 1055  HGB 10.7*  --  10.6*  --   --   --   HCT 36.3  --  34.8*  --   --   --   PLT 180  --  170  --   --   --   APTT  --   --  36  --   --  106*  LABPROT  --   --  20.5*  --   --   --   INR  --   --  1.7*  --   --   --   HEPARINUNFRC  --   --  0.45  --   --   --   CREATININE 7.21*  --  5.29*  --   --   --   TROPONINIHS 126*   < > 141* 146* 142*  --    < > = values in this interval not displayed.   Estimated Creatinine Clearance: 7.6 mL/min (A) (by C-G formula based on SCr of 5.29 mg/dL (H)).  Medical History: Past Medical History:  Diagnosis Date   Anemia of chronic disease    Aortic stenosis    Diabetes mellitus with complication (HCC)    Diabetic angiopathy (HCC)    Diabetic retinopathy (HCC)    ESRD (end stage renal disease) (HCC)    HLD (hyperlipidemia)    Hypertension    RBBB    Medications:  PTA Meds: Eliquis  5 mg BID, last dose 01/14/24 in AM  Assessment: Pt is a 68 yo female with hx of DVT on Eliquis  presenting to ED, now has cholecystisis, may need procedure.   Labs:  7/26 1055   aPTT 106/HL 0.45 - not correlating  7/26 2006   aPTT 85/HL 0.45 - correlating    Goal of Therapy:  Heparin  level 0.3-0.7 units/ml aPTT 66-102 seconds Monitor platelets by anticoagulation protocol: Yes   Plan:  aPTT therapeutic and now correlating with HL  Continue  heparin  infusion at current rate of 850 units/hr  Will switch to monitoring with only HL now that levels are correlating  Recheck HL in 8 hours to confirm rate  Monitor HL & CBC daily while on heparin   [name], PharmD Clinical Pharmacist 01/15/2024

## 2024-01-16 DIAGNOSIS — I350 Nonrheumatic aortic (valve) stenosis: Secondary | ICD-10-CM

## 2024-01-16 DIAGNOSIS — R55 Syncope and collapse: Secondary | ICD-10-CM | POA: Diagnosis not present

## 2024-01-16 DIAGNOSIS — N186 End stage renal disease: Secondary | ICD-10-CM | POA: Diagnosis not present

## 2024-01-16 DIAGNOSIS — K810 Acute cholecystitis: Secondary | ICD-10-CM | POA: Diagnosis not present

## 2024-01-16 LAB — GLUCOSE, CAPILLARY
Glucose-Capillary: 93 mg/dL (ref 70–99)
Glucose-Capillary: 98 mg/dL (ref 70–99)
Glucose-Capillary: 106 mg/dL — ABNORMAL HIGH (ref 70–99)
Glucose-Capillary: 159 mg/dL — ABNORMAL HIGH (ref 70–99)

## 2024-01-16 LAB — BASIC METABOLIC PANEL WITH GFR
Anion gap: 11 (ref 5–15)
BUN: 46 mg/dL — ABNORMAL HIGH (ref 8–23)
CO2: 25 mmol/L (ref 22–32)
Calcium: 8.6 mg/dL — ABNORMAL LOW (ref 8.9–10.3)
Chloride: 101 mmol/L (ref 98–111)
Creatinine, Ser: 7.43 mg/dL — ABNORMAL HIGH (ref 0.44–1.00)
GFR, Estimated: 5 mL/min — ABNORMAL LOW (ref >60)
Glucose, Bld: 95 mg/dL (ref 70–99)
Potassium: 4.1 mmol/L (ref 3.5–5.1)
Sodium: 137 mmol/L (ref 135–145)

## 2024-01-16 LAB — URINE CULTURE

## 2024-01-16 LAB — CBC
HCT: 30.9 % — ABNORMAL LOW (ref 36.0–46.0)
Hemoglobin: 9.2 g/dL — ABNORMAL LOW (ref 12.0–15.0)
MCH: 28.9 pg (ref 26.0–34.0)
MCHC: 29.8 g/dL — ABNORMAL LOW (ref 30.0–36.0)
MCV: 97.2 fL (ref 80.0–100.0)
Platelets: 158 K/uL (ref 150–400)
RBC: 3.18 MIL/uL — ABNORMAL LOW (ref 3.87–5.11)
RDW: 15.8 % — ABNORMAL HIGH (ref 11.5–15.5)
WBC: 8.4 K/uL (ref 4.0–10.5)
nRBC: 0 % (ref 0.0–0.2)

## 2024-01-16 LAB — HEPARIN LEVEL (UNFRACTIONATED): Heparin Unfractionated: 0.59 [IU]/mL (ref 0.30–0.70)

## 2024-01-16 MED ORDER — BISACODYL 10 MG RE SUPP
10.0000 mg | Freq: Every day | RECTAL | Status: DC | PRN
  Administered 2024-01-18: 10 mg via RECTAL
  Filled 2024-01-16: qty 1

## 2024-01-16 MED ORDER — HALOPERIDOL LACTATE 5 MG/ML IJ SOLN: 1.0000 mg | Freq: Four times a day (QID) | INTRAMUSCULAR | Status: DC | PRN

## 2024-01-16 MED ORDER — LACTULOSE 10 GM/15ML PO SOLN
20.0000 g | Freq: Once | ORAL | Status: AC
  Administered 2024-01-16: 20 g via ORAL
  Filled 2024-01-16: qty 30

## 2024-01-16 MED ORDER — DOCUSATE SODIUM 100 MG PO CAPS
100.0000 mg | ORAL_CAPSULE | Freq: Once | ORAL | Status: AC
  Administered 2024-01-16: 100 mg via ORAL
  Filled 2024-01-16: qty 1

## 2024-01-16 MED ORDER — SODIUM CHLORIDE 0.9 % IV SOLN
12.5000 mg | Freq: Four times a day (QID) | INTRAVENOUS | Status: DC | PRN
  Administered 2024-01-16: 12.5 mg via INTRAVENOUS
  Filled 2024-01-16: qty 0.5

## 2024-01-16 MED ORDER — LIDOCAINE 5 % EX PTCH
1.0000 | MEDICATED_PATCH | Freq: Every day | CUTANEOUS | Status: DC
  Administered 2024-01-16 – 2024-01-18 (×3): 1 via TRANSDERMAL
  Filled 2024-01-16 (×3): qty 1

## 2024-01-16 NOTE — Progress Notes (Addendum)
 Progress Note   Patient: Christy Curry FMW:[id number] DOB: 18-Feb-1956 DOA: 01/14/2024     2 DOS: the patient was seen and examined on 01/16/2024   Brief hospital course: Christy Curry is a 68 y.o. female with medical history significant of ESRD-HD (MWF), aortic stenosis (s/p of TAVR), DVT on Eliquis , HTN,  HLD, DM, CAD, dCHF, anemia, esophageal thickening, who presents with nausea, vomiting, abdominal pain, fever, dizziness, syncope.  Upon arriving to hospital, patient had temperature of 101.4, heart rate 110, respirate 30.  Lactic acid now normal.  CT scan showed gallstones with cholecystitis.  Patient seen by general surgery, started on Rocephin  and Flagyl .   Principal Problem:   Acute cholecystitis Active Problems:   Sepsis (HCC)   Essential hypertension   Syncope   Hypertensive urgency   CAD (coronary artery disease)   Myocardial injury   Hyperlipidemia   Pulmonary nodules   ESRD on dialysis (HCC)   Hyperkalemia   Esophageal thickening   Type II diabetes mellitus with renal manifestations (HCC)   DVT (deep venous thrombosis) (HCC)   Penetrating ulcer of aorta (HCC)   Thyroid  nodule   Assessment and Plan:  Sepsis due to acute cholecystitis Gallstone with cholecystitis.:  pt meets criteria for sepsis with fever 101.4, heart rate up to 110, RR up to 30.  Lactic acid 1.0 --> 1.4.   Patient seen by general surgery, decision was made to treat medically first.  If condition does not improve, may consider cholecystostomy. On Rocephin  and Flagyl .  Due to n.p.o. status, patient continued on low-dose IV fluids. Patient condition appears to be improving, white cell count has normalized.  Abdominal pain is better.  Appreciate surgery consult.    Essential hypertension and hypertensive urgency:   Resumed home medicines.   Syncope: Likely multifactorial etiology, including hypertensive urgency, sepsis.  CT head negative.  No focal neurodeficit on physical  examination. Continue to monitor.   CAD (coronary artery disease) and Myocardial injury: Troponin 126 --> 117. - Aspirin , Lipitor  No concern for ACS.   Hyperlipidemia -Lipitor    Pulmonary nodules: This is incidental findings by CTA - Follow up with PCP as outpatient   ESRD on dialysis (MWF) Hyperkalemia secondary to end-stage renal disease. : Patient is s/p of dialysis Friday Seen by nephrology, dialyzed tomorrow.    Esophageal thickening: This is chronic issue. -Patient need to follow-up with PCP and GI as outpatient workup   Type II diabetes mellitus with renal manifestations [facility]): Recent A1c 5.2, well-controlled.  Patient is taking NovoLog  -Sliding scale insulin    DVT (deep venous thrombosis) (HCC) -Switched Eliquis  to IV heparin  for possible surgery, monitor CBC.   Penetrating ulcer of aorta (HCC): CTA redemonstrated small penetrating aortic ulcer at the origin of the left subclavian artery, grossly stable. -need to follow-up with PCP and vascular surgeon as outpatient  Addendum: Patient had a syncope episode after walking two laps. She started nausea without vomiting, then brady to 20s on tele, regained conscious in a few seconds. HR increased to 88.  Reviewed echo performed 06/2023, REF 50-55%, grade 3 diastolic dysfunction, severe artoc stenosis. This presentation is due to severe artoc stenosis, will monitor with tele.     Subjective:  Patient still has some intermittent abdominal pain, but better.  Some nausea.  No vomiting.  Still constipated. No shortness of breath  Physical Exam: Vitals:   01/16/24 0253 01/16/24 0500 01/16/24 0736 01/16/24 1129  BP: 135/69  (!) 141/65 135/85  Pulse: 81  77 83  Resp: 18  17 16   Temp: 98.1 F (36.7 C)  98.6 F (37 C) 98.1 F (36.7 C)  TempSrc:   Oral Oral  SpO2: 95%  99% 99%  Weight:  55.4 kg    Height:       General exam: Appears calm and comfortable  Respiratory system: Clear to auscultation. Respiratory effort  normal. Cardiovascular system: S1 & S2 heard, RRR. No JVD, murmurs, rubs, gallops or clicks. No pedal edema. Gastrointestinal system: Abdomen is nondistended, soft and nontender. No organomegaly or masses felt. Normal bowel sounds heard. Central nervous system: Alert and oriented. No focal neurological deficits. Extremities: Symmetric 5 x 5 power. Skin: No rashes, lesions or ulcers Psychiatry: Judgement and insight appear normal. Mood & affect appropriate.    Data Reviewed:  Lab results reviewed.  Family Communication: Daughter updated at the bedside.  Disposition: Status is: Inpatient Remains inpatient appropriate because: Severity of disease, IV treatment.     Time spent: 55 minutes  Author: Murvin Mana, MD 01/16/2024 11:42 AM  For on call review [email or web address].

## 2024-01-16 NOTE — Progress Notes (Signed)
 . [facility]  ROUNDING NOTE   Subjective:   Patient well-known to us  as we follow her for outpatient hemodialysis treatments. She came in with presyncopal symptoms as well as nausea and vomiting.  Patient is known to our practice and receives outpatient dialysis treatments at [facility] on a MWF schedule, supervised by Dr. Marcelino.  Patient seen resting in bed Daughter at bedside Complains of generalized malaise Denies nausea Denies pain   Objective:  Vital signs in last 24 hours:  Temp:  [98.1 F (36.7 C)-99.1 F (37.3 C)] 98.1 F (36.7 C) (07/27 1129) Pulse Rate:  [73-86] 83 (07/27 1129) Resp:  [16-18] 16 (07/27 1129) BP: (94-141)/(49-85) 135/85 (07/27 1129) SpO2:  [94 %-99 %] 99 % (07/27 1129) FiO2 (%):  [21 %] 21 % (07/26 2249) Weight:  [55.4 kg] 55.4 kg (07/27 0500)  Weight change: 1.984 kg Filed Weights   01/15/24 0200 01/15/24 0517 01/16/24 0500  Weight: 53.4 kg 53.4 kg 55.4 kg    Intake/Output: I/O last 3 completed shifts: In: 1405.5 [I.V.:1042.8; IV Piggyback:362.8] Out: 100 [Urine:100]   Intake/Output this shift:  No intake/output data recorded.  Physical Exam: General: No acute distress, frail  Head: Normocephalic, atraumatic. Moist oral mucosal membranes  Neck: Supple  Lungs:  Diminished  Heart: S1S2 no rubs  Abdomen:  Soft, nontender, bowel sounds present  Extremities: Trace peripheral edema.  Neurologic: Awake, alert, following commands  Skin: No acute rash  Access: Left upper extremity AV graft    Basic Metabolic Panel: Recent Labs  Lab 01/14/24 0426 01/15/24 0203 01/16/24 0623  NA 142 135 137  K 5.2* 4.0 4.1  CL 102 94* 101  CO2 29 27 25   GLUCOSE 159* 204* 95  BUN 45* 31* 46*  CREATININE 7.21* 5.29* 7.43*  CALCIUM  9.4 8.8* 8.6*    Liver Function Tests: Recent Labs  Lab 01/14/24 0426  AST 35  ALT 12  ALKPHOS 114  BILITOT 1.0  PROT 6.1*  ALBUMIN 3.2*   No results for input(s): LIPASE, AMYLASE in  the last 168 hours. No results for input(s): AMMONIA in the last 168 hours.  CBC: Recent Labs  Lab 01/14/24 0426 01/15/24 0203 01/16/24 0623  WBC 10.2 13.2* 8.4  NEUTROABS 8.1*  --   --   HGB 10.7* 10.6* 9.2*  HCT 36.3 34.8* 30.9*  MCV 98.4 95.9 97.2  PLT 180 170 158    Cardiac Enzymes: No results for input(s): CKTOTAL, CKMB, CKMBINDEX, TROPONINI in the last 168 hours.  BNP: Invalid input(s): POCBNP  CBG: Recent Labs  Lab 01/15/24 1623 01/15/24 1858 01/15/24 2056 01/16/24 0827 01/16/24 1126  GLUCAP 71 90 89 93 98    Microbiology: Results for orders placed or performed during the hospital encounter of 01/14/24  Urine Culture     Status: Abnormal   Collection Time: 01/14/24  9:53 PM   Specimen: Urine, Random  Result Value Ref Range Status   Specimen Description   Final    URINE, RANDOM Performed at [facility], [address] [place], [place] [address]    Special Requests   Final    NONE Reflexed from [id number] Performed at [facility], [address] [place], [place] [address]    Culture MULTIPLE SPECIES PRESENT, SUGGEST RECOLLECTION (A)  Final   Report Status 01/16/2024 FINAL  Final  Blood Culture (routine x 2)     Status: None (Preliminary result)   Collection Time: 01/14/24 11:32 PM   Specimen: BLOOD  Result Value  Ref Range Status   Specimen Description BLOOD BLOOD RIGHT HAND  Final   Special Requests   Final    BOTTLES DRAWN AEROBIC AND ANAEROBIC Blood Culture adequate volume   Culture   Final    NO GROWTH 2 DAYS Performed at [facility], [address], [place], [place] [address]    Report Status PENDING  Incomplete  Blood Culture (routine x 2)     Status: None (Preliminary result)   Collection Time: 01/14/24 11:32 PM   Specimen: BLOOD  Result Value Ref Range Status   Specimen Description BLOOD BLOOD RIGHT HAND  Final   Special Requests   Final    BOTTLES DRAWN AEROBIC AND ANAEROBIC Blood Culture  results may not be optimal due to an inadequate volume of blood received in culture bottles   Culture   Final    NO GROWTH 2 DAYS Performed at [facility], [address], [place], [place] [address]    Report Status PENDING  Incomplete    Coagulation Studies: Recent Labs    01/15/24 0203  LABPROT 20.5*  INR 1.7*    Urinalysis: Recent Labs    01/14/24 2153  COLORURINE YELLOW*  LABSPEC 1.018  PHURINE 9.0*  GLUCOSEU 150*  HGBUR NEGATIVE  BILIRUBINUR NEGATIVE  KETONESUR NEGATIVE  PROTEINUR >=300*  NITRITE NEGATIVE  LEUKOCYTESUR TRACE*      Imaging: CT ABDOMEN PELVIS W CONTRAST Result Date: 01/14/2024 CLINICAL DATA:  Abdomen pain EXAM: CT ABDOMEN AND PELVIS WITH CONTRAST TECHNIQUE: Multidetector CT imaging of the abdomen and pelvis was performed using the standard protocol following bolus administration of intravenous contrast. RADIATION DOSE REDUCTION: This exam was performed according to the departmental dose-optimization program which includes automated exposure control, adjustment of the mA and/or kV according to patient size and/or use of iterative reconstruction technique. CONTRAST:  75mL OMNIPAQUE  IOHEXOL  350 MG/ML SOLN COMPARISON:  CT 11/25/2022, 07/06/2023 FINDINGS: Lower chest: Lung bases demonstrate no acute airspace disease. Cardiomegaly. Distal esophageal and GE junction thickening. Hepatobiliary: Distended gallbladder. Multiple stones at the gallbladder neck. Diffuse gallbladder wall thickening and or pericholecystic fluid. No biliary dilatation. Atrophic appearing left hepatic lobe as before. Pancreas: Unremarkable. No pancreatic ductal dilatation or surrounding inflammatory changes. Spleen: Normal in size without focal abnormality. Adrenals/Urinary Tract: Adrenal glands are within normal limits. Kidneys show no hydronephrosis. Cyst in the right kidney, no imaging follow-up is recommended. Delayed excretion of contrast. Contrast or hyperdense material in the  urinary bladder. Stomach/Bowel: Stomach is decompressed but there may be diffuse gastric wall thickening. Focal lobulated hyperenhancing area at the gastric antrum measuring 15 by 14 by 16 mm on series 2, image 29 and coronal series 6, image 16. There is no dilated small bowel. Negative appendix. No acute bowel wall thickening Vascular/Lymphatic: Advanced aortic atherosclerosis. No aneurysm. No suspicious lymph nodes Reproductive: No suspicious adnexal mass.  Probable uterine fibroids Other: Negative for pelvic effusion or free air Musculoskeletal: No acute or suspicious osseous abnormality. Multilevel Schmorl's nodes IMPRESSION: 1. Distended gallbladder with multiple stones at the gallbladder neck. Diffuse gallbladder wall thickening and or pericholecystic fluid. Imaging appearance is suspicious for an acute cholecystitis. Correlation with ultrasound is suggested. 2. Stomach is decompressed but there may be diffuse gastric wall thickening, question gastritis. Distal esophageal and GE junction thickening as well. Focal lobulated hyperenhancing area at the gastric antrum measuring 15 x 14 x 16 mm, possible enhancing mucosal lesion/mass. Recommend further evaluation with endoscopy. 3. Delayed excretion of contrast from the kidneys, correlate with appropriate lab  values. There appears to be excreted contrast though within the urinary bladder (? Has the patient had recent contrast administration prior to today ) Aortic Atherosclerosis (ICD10-I70.0). Electronically Signed   By: Luke Bun M.D.   On: 01/14/2024 21:52   CT Angio Chest PE W and/or Wo Contrast Result Date: 01/14/2024 CLINICAL DATA:  Not feeling well syncope EXAM: CT ANGIOGRAPHY CHEST WITH CONTRAST TECHNIQUE: Multidetector CT imaging of the chest was performed using the standard protocol during bolus administration of intravenous contrast. Multiplanar CT image reconstructions and MIPs were obtained to evaluate the vascular anatomy. RADIATION DOSE  REDUCTION: This exam was performed according to the departmental dose-optimization program which includes automated exposure control, adjustment of the mA and/or kV according to patient size and/or use of iterative reconstruction technique. CONTRAST:  75mL OMNIPAQUE  IOHEXOL  350 MG/ML SOLN COMPARISON:  CT angiography 11/25/2022 FINDINGS: Cardiovascular: Satisfactory opacification of the pulmonary arteries to the segmental level. No evidence of pulmonary embolism. Moderate aortic atherosclerosis. No aneurysm or dissection. Common origin of the right brachiocephalic and left common carotid arteries. 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 Suspect stenosis at the origin of the left vertebral artery. Status post TAVR. Coronary vascular calcification. Cardiomegaly. No pericardial effusion Mediastinum/Nodes: Patent trachea. Multiple thyroid  nodules measuring up to 1.5 cm. No suspicious lymph nodes. Esophagus shows hernia and prominent distal esophageal thickening and thickening at the GE junction. Lungs/Pleura: No acute airspace disease, pleural effusion or pneumothorax. Multiple small pulmonary nodules, largest is seen at the left apical lung measuring 5 mm series 5, image 25, previously 5 mm. Upper Abdomen: See separately dictated CT Musculoskeletal: No acute osseous abnormality Review of the MIP images confirms the above findings. IMPRESSION: 1. Negative for acute pulmonary embolus or aortic dissection. 2. Cardiomegaly with interval TAVR. 3. 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 4. Multiple small pulmonary nodules measuring up to 5 mm, stable. No specific imaging follow-up is recommended. 5. Small distal esophageal hernia with prominent distal esophageal thickening and thickening at the GE junction. Endoscopy follow-up if deemed appropriate. 6. Several small thyroid   nodules measuring up to 1.5 cm. Recommend thyroid  US  (ref: J Am Coll Radiol. 2015 Feb;12(2): 143-50).This should be performed non emergently 7. Aortic atherosclerosis. Aortic Atherosclerosis (ICD10-I70.0). Electronically Signed   By: Luke Bun M.D.   On: 01/14/2024 21:39   CT Head Wo Contrast Result Date: 01/14/2024 CLINICAL DATA:  Mental status change, unknown cause EXAM: CT HEAD WITHOUT CONTRAST TECHNIQUE: Contiguous axial images were obtained from the base of the skull through the vertex without intravenous contrast. RADIATION DOSE REDUCTION: This exam was performed according to the departmental dose-optimization program which includes automated exposure control, adjustment of the mA and/or kV according to patient size and/or use of iterative reconstruction technique. COMPARISON:  None Available. FINDINGS: Brain: Normal anatomic configuration. No abnormal intra or extra-axial mass lesion or fluid collection. No abnormal mass effect or midline shift. No evidence of acute intracranial hemorrhage or infarct. Ventricular size is normal. Cerebellum unremarkable. Vascular: Unremarkable Skull: Intact Sinuses/Orbits: Paranasal sinuses are clear. Orbits are unremarkable. Other: Mastoid air cells and middle ear cavities are clear. IMPRESSION: 1. Normal CT examination of the brain. Electronically Signed   By: Dorethia Molt M.D.   On: 01/14/2024 19:16     Medications:    cefTRIAXone  (ROCEPHIN )  IV 2 g (01/15/24 2141)   dextrose  5 % and 0.9 % NaCl  50 mL/hr at 01/16/24 0422   heparin  850 Units/hr (01/16/24 0220)   metronidazole  500 mg (01/16/24 0932)    aspirin  EC  81 mg Oral Daily   atorvastatin   80 mg Oral QHS   carvedilol   25 mg Oral BID WC   Chlorhexidine  Gluconate Cloth  6 each Topical Q0600   insulin  aspart  0-5 Units Subcutaneous QHS   insulin  aspart  0-9 Units Subcutaneous TID WC   lidocaine   1 patch Transdermal QHS   losartan   25 mg Oral Daily   multivitamin  1 tablet Oral QHS   ondansetron   (ZOFRAN ) IV  4 mg Intravenous Once   pantoprazole   40 mg Intravenous Daily   acetaminophen , albuterol , bisacodyl , dextromethorphan-guaiFENesin , hydrALAZINE , morphine  injection, ondansetron  (ZOFRAN ) IV, oxyCODONE -acetaminophen , traZODone   Assessment/ Plan:  68 y.o. female with past medical history of ESRD on HD, hypertension, diabetes mellitus type 2, DVT, aortic stenosis status post TAVR 08/19/2023, anemia of chronic disease, secondary hyperparathyroidism who presented with presyncopal symptoms and severe hypertension.  [facility]/MWF/left AVG  1.  ESRD on HD MWF.  Next treatment scheduled for Monday.  2.  Hypertension.  Systolic blood pressure was 220 upon admission.  Blood pressure control improved today, 135/85.  3.  Anemia of chronic kidney disease.  Hemoglobin stable, 9.2.  Patient receives Mircera at outpatient clinic.  Will order low-dose EPO 4000 units IV with dialysis tomorrow  4.  Hyperkalemia.  Serum potassium stable., 4.1  5.  Septic, acute cholecystitis.  Currently receiving medical management per general surgery.  If condition does not improve, will consider cholecystectomy.   LOS: 2 Ghazal Pevey 7/27/202512:46 PM

## 2024-01-16 NOTE — Plan of Care (Signed)
  Problem: Coping: Goal: Ability to adjust to condition or change in health will improve Outcome: Progressing   Problem: Fluid Volume: Goal: Ability to maintain a balanced intake and output will improve Outcome: Progressing   Problem: Metabolic: Goal: Ability to maintain appropriate glucose levels will improve Outcome: Progressing   Problem: Education: Goal: Knowledge of General Education information will improve Description: Including pain rating scale, medication(s)/side effects and non-pharmacologic comfort measures Outcome: Progressing   Problem: Skin Integrity: Goal: Risk for impaired skin integrity will decrease Outcome: Progressing

## 2024-01-16 NOTE — Progress Notes (Signed)
 ANTICOAGULATION CONSULT NOTE  Pharmacy Consult for heparin  infusion Indication: DVT  Allergies  Allergen Reactions   2,4-D Dimethylamine Swelling   Other Swelling   Tetanus Antitoxin Itching   Tetanus Toxoid    Tetanus Toxoids Itching   Patient Measurements: Height: 5' 1 (154.9 cm) Weight: 55.4 kg (122 lb 1.6 oz) IBW/kg (Calculated) : 47.8 HEPARIN  DW (KG): 56.2  Vital Signs: Temp: 98.1 F (36.7 C) (07/27 0253) Temp Source: Oral (07/26 2331) BP: 135/69 (07/27 0253) Pulse Rate: 81 (07/27 0253)  Labs: Recent Labs    01/14/24 0426 01/14/24 0615 01/15/24 0203 01/15/24 0604 01/15/24 0836 01/15/24 1055 01/15/24 2006 01/16/24 0623  HGB 10.7*  --  10.6*  --   --   --   --  9.2*  HCT 36.3  --  34.8*  --   --   --   --  30.9*  PLT 180  --  170  --   --   --   --  158  APTT  --   --  36  --   --  106* 85*  --   LABPROT  --   --  20.5*  --   --   --   --   --   INR  --   --  1.7*  --   --   --   --   --   HEPARINUNFRC  --   --  0.45  --   --   --   --  0.59  CREATININE 7.21*  --  5.29*  --   --   --   --  7.43*  TROPONINIHS 126*   < > 141* 146* 142*  --   --   --    < > = values in this interval not displayed.   Estimated Creatinine Clearance: 5.4 mL/min (A) (by C-G formula based on SCr of 7.43 mg/dL (H)).  Medical History: Past Medical History:  Diagnosis Date   Anemia of chronic disease    Aortic stenosis    Diabetes mellitus with complication (HCC)    Diabetic angiopathy (HCC)    Diabetic retinopathy (HCC)    ESRD (end stage renal disease) (HCC)    HLD (hyperlipidemia)    Hypertension    RBBB    Medications:  PTA Meds: Eliquis  5 mg BID, last dose 01/14/24 in AM  Assessment: Pt is a 68 yo female with hx of DVT on Eliquis  presenting to ED, now has cholecystisis, may need procedure.   Labs:  7/26 1055   aPTT 106/HL 0.45 - not correlating  7/26 2006   aPTT 85/HL 0.45 - correlating 7/27 0623  HL 0.59, therapeutic x 2   Goal of Therapy:  Heparin  level  0.3-0.7 units/ml aPTT 66-102 seconds Monitor platelets by anticoagulation protocol: Yes   Plan:  Continue heparin  infusion at current rate of 850 units/hr  Recheck HL daily w/ AM labs while therapeutic  Monitor CBC daily while on heparin   Rankin CANDIE Dills, PharmD, [facility] 01/16/2024 7:01 AM

## 2024-01-16 NOTE — Progress Notes (Signed)
 Pt ambulated around nursing station with nurse. She then returned to her room and sat in the chair while the nurse was changing her bed sheets. Pt stood to return to bed and stated that she felt nauseous and went to the sink to vomit. Pt did not vomit, but began to pass out. Pt lowered to the floor by nurse. Pt out for about 5 secounds, then responded to nurse. Pt now alert and oriented and has been returned to bed. Heart rate decreased to 20's during this event. Heart rate is now 88 in sinus rhythm. BP now 175/78. Dr. Laurita notified. Will continue to monitor pt.

## 2024-01-16 NOTE — Progress Notes (Signed)
   01/16/24 1625  What Happened  Was fall witnessed? Yes  Who witnessed fall? Jordis Repetto RN  Patients activity before fall ambulating-assisted  Point of contact other (comment) (Nurse standing beside pt and lowered pt to floor.)  Was patient injured? No  Provider Notification  Provider Name/Title Dr. Laurita  Date Provider Notified 01/16/24  Time Provider Notified 1628  Method of Notification Page  Notification Reason Fall  Date of Provider Response 01/16/24  Time of Provider Response 1631  Follow Up  Family notified Yes - comment (Family at bedside)  Time family notified 1625  Progress note created (see row info) Yes  Adult Fall Risk Assessment  Risk Factor Category (scoring not indicated) High fall risk per protocol (document High fall risk)  Patient Fall Risk Level High fall risk  Adult Fall Risk Interventions  Required Bundle Interventions *See Row Information* High fall risk - low, moderate, and high requirements implemented  Additional Interventions Room near nurses station  Screening for Fall Injury Risk (To be completed on HIGH fall risk patients) - Assessing Need for Floor Mats  Risk For Fall Injury- Criteria for Floor Mats Previous fall this admission  Will Implement Floor Mats Yes  Vitals  Temp 98.8 F (37.1 C)  Temp Source Oral  BP (!) 175/78  MAP (mmHg) 106  BP Location Right Leg  BP Method Automatic  Patient Position (if appropriate) Lying  Pulse Rate 92  Pulse Rate Source Monitor  ECG Heart Rate 92  Cardiac Rhythm NSR  Resp 20  Oxygen Therapy  SpO2 98 %  Pain Assessment  Pain Scale 0-10  Pain Score 0  PCA/Epidural/Spinal Assessment  Respiratory Pattern Regular;Unlabored  Neurological  Neuro (WDL) WDL  Level of Consciousness Alert  Orientation Level Oriented X4  Glasgow Coma Scale  Eye Opening 4  Best Verbal Response (NON-intubated) 5  Best Motor Response 6  Glasgow Coma Scale Score 15  Musculoskeletal  Musculoskeletal (WDL) X  Generalized  Weakness Yes  Integumentary  Integumentary (WDL) X  Skin Color Appropriate for ethnicity  Skin Condition Dry;Flaky

## 2024-01-16 NOTE — Progress Notes (Signed)
   01/16/24 0800  Spiritual Encounters  Type of Visit Initial  Care provided to: Patient;Pt and family  Referral source Patient request  Reason for visit Advance directives  OnCall Visit Yes   Chaplain responded to consult for advance directives. Chaplain provided patient and family AD paperwork and explained the process of completing it. Patient will have Chaplain paged when she is ready to sign.

## 2024-01-16 NOTE — Progress Notes (Signed)
 CC: Cholecystitis Subjective: Patient reports feeling much better this morning.  She says she still has some pain in the right upper quadrant but it is less severe.  She denies any nausea or vomiting.  She would like to try something to drink.  Her leukocytosis has resolved and she continues on antibiotics.  She is scheduled for dialysis today.  Objective: Vital signs in last 24 hours: Temp:  [98.1 F (36.7 C)-99.1 F (37.3 C)] 98.1 F (36.7 C) (07/27 1129) Pulse Rate:  [73-86] 83 (07/27 1129) Resp:  [16-18] 16 (07/27 1129) BP: (94-141)/(49-85) 135/85 (07/27 1129) SpO2:  [94 %-99 %] 99 % (07/27 1129) FiO2 (%):  [21 %] 21 % (07/26 2249) Weight:  [55.4 kg] 55.4 kg (07/27 0500) Last BM Date : 01/13/24  Intake/Output from previous day: 07/26 0701 - 07/27 0700 In: 1147.5 [I.V.:964.3; IV Piggyback:183.1] Out: 100 [Urine:100] Intake/Output this shift: Total I/O In: 1009 [P.O.:480; I.V.:529] Out: -   Physical exam:  Alert and oriented x 3, normal work of breathing on room air, abdomen is soft, tender to palpation in the right upper quadrant without any rebound tenderness and negative Murphy sign, some tenderness in the right lower quadrant as well.  No jaundice.  Lab Results: CBC  Recent Labs    01/15/24 0203 01/16/24 0623  WBC 13.2* 8.4  HGB 10.6* 9.2*  HCT 34.8* 30.9*  PLT 170 158   BMET Recent Labs    01/15/24 0203 01/16/24 0623  NA 135 137  K 4.0 4.1  CL 94* 101  CO2 27 25  GLUCOSE 204* 95  BUN 31* 46*  CREATININE 5.29* 7.43*  CALCIUM  8.8* 8.6*   PT/INR Recent Labs    01/15/24 0203  LABPROT 20.5*  INR 1.7*   ABG No results for input(s): PHART, HCO3 in the last 72 hours.  Invalid input(s): PCO2, PO2  Studies/Results: CT ABDOMEN PELVIS W CONTRAST Result Date: 01/14/2024 CLINICAL DATA:  Abdomen pain EXAM: CT ABDOMEN AND PELVIS WITH CONTRAST TECHNIQUE: Multidetector CT imaging of the abdomen and pelvis was performed using the standard protocol  following bolus administration of intravenous contrast. RADIATION DOSE REDUCTION: This exam was performed according to the departmental dose-optimization program which includes automated exposure control, adjustment of the mA and/or kV according to patient size and/or use of iterative reconstruction technique. CONTRAST:  75mL OMNIPAQUE  IOHEXOL  350 MG/ML SOLN COMPARISON:  CT 11/25/2022, 07/06/2023 FINDINGS: Lower chest: Lung bases demonstrate no acute airspace disease. Cardiomegaly. Distal esophageal and GE junction thickening. Hepatobiliary: Distended gallbladder. Multiple stones at the gallbladder neck. Diffuse gallbladder wall thickening and or pericholecystic fluid. No biliary dilatation. Atrophic appearing left hepatic lobe as before. Pancreas: Unremarkable. No pancreatic ductal dilatation or surrounding inflammatory changes. Spleen: Normal in size without focal abnormality. Adrenals/Urinary Tract: Adrenal glands are within normal limits. Kidneys show no hydronephrosis. Cyst in the right kidney, no imaging follow-up is recommended. Delayed excretion of contrast. Contrast or hyperdense material in the urinary bladder. Stomach/Bowel: Stomach is decompressed but there may be diffuse gastric wall thickening. Focal lobulated hyperenhancing area at the gastric antrum measuring 15 by 14 by 16 mm on series 2, image 29 and coronal series 6, image 16. There is no dilated small bowel. Negative appendix. No acute bowel wall thickening Vascular/Lymphatic: Advanced aortic atherosclerosis. No aneurysm. No suspicious lymph nodes Reproductive: No suspicious adnexal mass.  Probable uterine fibroids Other: Negative for pelvic effusion or free air Musculoskeletal: No acute or suspicious osseous abnormality. Multilevel Schmorl's nodes IMPRESSION: 1. Distended gallbladder with multiple stones  at the gallbladder neck. Diffuse gallbladder wall thickening and or pericholecystic fluid. Imaging appearance is suspicious for an acute  cholecystitis. Correlation with ultrasound is suggested. 2. Stomach is decompressed but there may be diffuse gastric wall thickening, question gastritis. Distal esophageal and GE junction thickening as well. Focal lobulated hyperenhancing area at the gastric antrum measuring 15 x 14 x 16 mm, possible enhancing mucosal lesion/mass. Recommend further evaluation with endoscopy. 3. Delayed excretion of contrast from the kidneys, correlate with appropriate lab values. There appears to be excreted contrast though within the urinary bladder (? Has the patient had recent contrast administration prior to today ) Aortic Atherosclerosis (ICD10-I70.0). Electronically Signed   By: Luke Bun M.D.   On: 01/14/2024 21:52   CT Angio Chest PE W and/or Wo Contrast Result Date: 01/14/2024 CLINICAL DATA:  Not feeling well syncope EXAM: CT ANGIOGRAPHY CHEST WITH CONTRAST TECHNIQUE: Multidetector CT imaging of the chest was performed using the standard protocol during bolus administration of intravenous contrast. Multiplanar CT image reconstructions and MIPs were obtained to evaluate the vascular anatomy. RADIATION DOSE REDUCTION: This exam was performed according to the departmental dose-optimization program which includes automated exposure control, adjustment of the mA and/or kV according to patient size and/or use of iterative reconstruction technique. CONTRAST:  75mL OMNIPAQUE  IOHEXOL  350 MG/ML SOLN COMPARISON:  CT angiography 11/25/2022 FINDINGS: Cardiovascular: Satisfactory opacification of the pulmonary arteries to the segmental level. No evidence of pulmonary embolism. Moderate aortic atherosclerosis. No aneurysm or dissection. Common origin of the right brachiocephalic and left common carotid arteries. 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 Suspect stenosis at the origin of the left vertebral artery. Status post TAVR. Coronary vascular  calcification. Cardiomegaly. No pericardial effusion Mediastinum/Nodes: Patent trachea. Multiple thyroid  nodules measuring up to 1.5 cm. No suspicious lymph nodes. Esophagus shows hernia and prominent distal esophageal thickening and thickening at the GE junction. Lungs/Pleura: No acute airspace disease, pleural effusion or pneumothorax. Multiple small pulmonary nodules, largest is seen at the left apical lung measuring 5 mm series 5, image 25, previously 5 mm. Upper Abdomen: See separately dictated CT Musculoskeletal: No acute osseous abnormality Review of the MIP images confirms the above findings. IMPRESSION: 1. Negative for acute pulmonary embolus or aortic dissection. 2. Cardiomegaly with interval TAVR. 3. 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 4. Multiple small pulmonary nodules measuring up to 5 mm, stable. No specific imaging follow-up is recommended. 5. Small distal esophageal hernia with prominent distal esophageal thickening and thickening at the GE junction. Endoscopy follow-up if deemed appropriate. 6. Several small thyroid  nodules measuring up to 1.5 cm. Recommend thyroid  US  (ref: J Am Coll Radiol. 2015 Feb;12(2): 143-50).This should be performed non emergently 7. Aortic atherosclerosis. Aortic Atherosclerosis (ICD10-I70.0). Electronically Signed   By: Luke Bun M.D.   On: 01/14/2024 21:39   CT Head Wo Contrast Result Date: 01/14/2024 CLINICAL DATA:  Mental status change, unknown cause EXAM: CT HEAD WITHOUT CONTRAST TECHNIQUE: Contiguous axial images were obtained from the base of the skull through the vertex without intravenous contrast. RADIATION DOSE REDUCTION: This exam was performed according to the departmental dose-optimization program which includes automated exposure control, adjustment of the mA and/or kV according to patient size and/or use of iterative reconstruction technique. COMPARISON:  None Available. FINDINGS:  Brain: Normal anatomic configuration. No abnormal intra or extra-axial mass lesion or fluid collection. No abnormal mass effect or midline  shift. No evidence of acute intracranial hemorrhage or infarct. Ventricular size is normal. Cerebellum unremarkable. Vascular: Unremarkable Skull: Intact Sinuses/Orbits: Paranasal sinuses are clear. Orbits are unremarkable. Other: Mastoid air cells and middle ear cavities are clear. IMPRESSION: 1. Normal CT examination of the brain. Electronically Signed   By: Dorethia Molt M.D.   On: 01/14/2024 19:16    Anti-infectives: Anti-infectives (From admission, onward)    Start     Dose/Rate Route Frequency Ordered Stop   01/15/24 2200  cefTRIAXone  (ROCEPHIN ) 2 g in sodium chloride  0.9 % 100 mL IVPB        2 g 200 mL/hr over 30 Minutes Intravenous Every 24 hours 01/15/24 0100     01/15/24 0800  metroNIDAZOLE  (FLAGYL ) IVPB 500 mg        500 mg 100 mL/hr over 60 Minutes Intravenous Every 12 hours 01/15/24 0100     01/14/24 2300  cefTRIAXone  (ROCEPHIN ) 2 g in sodium chloride  0.9 % 100 mL IVPB        2 g 200 mL/hr over 30 Minutes Intravenous Once 01/14/24 2250 01/14/24 2341   01/14/24 2300  metroNIDAZOLE  (FLAGYL ) IVPB 500 mg        500 mg 100 mL/hr over 60 Minutes Intravenous  Once 01/14/24 2250 01/15/24 0155       Assessment/Plan:  Patient with acute cholecystitis.  Vital signs have all normalized and she is feeling better.  Continue on antibiotics.  We will advance her to a clear liquid diet.  If she tolerates this over the next 24 hours may be able to advance diet as tolerated.  Gust with her that if we are able to treat this with just antibiotics we will bring her back into the office in several weeks and discuss the need for interval cholecystectomy.  I also discussed with her that if she has worsening hemodynamic parameters or pain or leukocytosis then she may need a cholecystostomy tube but I am hopeful that we can get her through this episode without needing  that.  A total of 35 minutes was spent reviewing the patient's chart, performing an interval history and physical and discussing treatment options with the patient  Jayson Endow, M.D.  [facility]

## 2024-01-17 DIAGNOSIS — K810 Acute cholecystitis: Secondary | ICD-10-CM | POA: Diagnosis not present

## 2024-01-17 LAB — RENAL FUNCTION PANEL
Albumin: 2.5 g/dL — ABNORMAL LOW (ref 3.5–5.0)
Anion gap: 17 — ABNORMAL HIGH (ref 5–15)
BUN: 54 mg/dL — ABNORMAL HIGH (ref 8–23)
CO2: 21 mmol/L — ABNORMAL LOW (ref 22–32)
Calcium: 8.6 mg/dL — ABNORMAL LOW (ref 8.9–10.3)
Chloride: 100 mmol/L (ref 98–111)
Creatinine, Ser: 8.95 mg/dL — ABNORMAL HIGH (ref 0.44–1.00)
GFR, Estimated: 4 mL/min — ABNORMAL LOW (ref >60)
Glucose, Bld: 173 mg/dL — ABNORMAL HIGH (ref 70–99)
Phosphorus: 7 mg/dL — ABNORMAL HIGH (ref 2.5–4.6)
Potassium: 4 mmol/L (ref 3.5–5.1)
Sodium: 138 mmol/L (ref 135–145)

## 2024-01-17 LAB — CBC
HCT: 30.8 % — ABNORMAL LOW (ref 36.0–46.0)
Hemoglobin: 9 g/dL — ABNORMAL LOW (ref 12.0–15.0)
MCH: 28.8 pg (ref 26.0–34.0)
MCHC: 29.2 g/dL — ABNORMAL LOW (ref 30.0–36.0)
MCV: 98.4 fL (ref 80.0–100.0)
Platelets: 181 K/uL (ref 150–400)
RBC: 3.13 MIL/uL — ABNORMAL LOW (ref 3.87–5.11)
RDW: 15.8 % — ABNORMAL HIGH (ref 11.5–15.5)
WBC: 6.8 K/uL (ref 4.0–10.5)
nRBC: 0 % (ref 0.0–0.2)

## 2024-01-17 LAB — GLUCOSE, CAPILLARY
Glucose-Capillary: 67 mg/dL — ABNORMAL LOW (ref 70–99)
Glucose-Capillary: 202 mg/dL — ABNORMAL HIGH (ref 70–99)
Glucose-Capillary: 67 mg/dL — ABNORMAL LOW (ref 70–99)
Glucose-Capillary: 64 mg/dL — ABNORMAL LOW (ref 70–99)
Glucose-Capillary: 91 mg/dL (ref 70–99)
Glucose-Capillary: 91 mg/dL (ref 70–99)
Glucose-Capillary: 144 mg/dL — ABNORMAL HIGH (ref 70–99)
Glucose-Capillary: 149 mg/dL — ABNORMAL HIGH (ref 70–99)

## 2024-01-17 LAB — HEPARIN LEVEL (UNFRACTIONATED): Heparin Unfractionated: 0.53 [IU]/mL (ref 0.30–0.70)

## 2024-01-17 MED ORDER — MELATONIN 5 MG PO TABS
5.0000 mg | ORAL_TABLET | Freq: Every day | ORAL | Status: DC
  Administered 2024-01-17: 5 mg via ORAL
  Filled 2024-01-17: qty 1

## 2024-01-17 MED ORDER — ACETAMINOPHEN 325 MG PO TABS
ORAL_TABLET | ORAL | Status: AC
  Filled 2024-01-17: qty 1

## 2024-01-17 MED ORDER — LOSARTAN POTASSIUM 25 MG PO TABS
25.0000 mg | ORAL_TABLET | Freq: Every day | ORAL | Status: DC
  Filled 2024-01-17: qty 1

## 2024-01-17 MED ORDER — CLONIDINE HCL 0.1 MG PO TABS
ORAL_TABLET | ORAL | Status: AC
  Filled 2024-01-17: qty 1

## 2024-01-17 MED ORDER — DEXTROSE 50 % IV SOLN
INTRAVENOUS | Status: AC
  Administered 2024-01-17: 50 mL
  Filled 2024-01-17: qty 50

## 2024-01-17 MED ORDER — LOSARTAN POTASSIUM 50 MG PO TABS
50.0000 mg | ORAL_TABLET | Freq: Every day | ORAL | Status: DC
  Administered 2024-01-18: 50 mg via ORAL
  Filled 2024-01-17: qty 1

## 2024-01-17 MED ORDER — CLONIDINE HCL 0.1 MG PO TABS
0.1000 mg | ORAL_TABLET | Freq: Two times a day (BID) | ORAL | Status: DC
  Administered 2024-01-17 – 2024-01-18 (×3): 0.1 mg via ORAL
  Filled 2024-01-17 (×3): qty 1

## 2024-01-17 MED ORDER — BOOST / RESOURCE BREEZE PO LIQD CUSTOM
1.0000 | Freq: Three times a day (TID) | ORAL | Status: DC
  Administered 2024-01-17 (×3): 1 via ORAL

## 2024-01-17 MED ORDER — APIXABAN 5 MG PO TABS
5.0000 mg | ORAL_TABLET | Freq: Two times a day (BID) | ORAL | Status: DC
  Administered 2024-01-17 – 2024-01-18 (×3): 5 mg via ORAL
  Filled 2024-01-17 (×4): qty 1

## 2024-01-17 NOTE — Progress Notes (Signed)
 Transition of Care [facility]) - Inpatient Brief Assessment   Patient Details  Name: Christy Curry MRN: [id number] Date of Birth: 1956/04/19  [facility] of [facility] CM/SW Contact:    Claribel Sachs C Lilleigh Hechavarria, RN Phone Number: 01/17/2024, 2:45 PM   Clinical Narrative: TOC continuing to follow patient's progress throughout discharge planning.   Transition of Care Asessment: Insurance and Status: Insurance coverage has been reviewed Patient has primary care physician: Yes   Prior level of function:: Independent Prior/Current Home Services: No current home services Social Drivers of Health Review: SDOH reviewed no interventions necessary Readmission risk has been reviewed: Yes Transition of care needs: no transition of care needs at this time

## 2024-01-17 NOTE — Progress Notes (Addendum)
 Progress Note   Patient: Christy Curry DOB: August 10, 1955 DOA: 01/14/2024     3 DOS: the patient was seen and examined on 01/17/2024   Brief hospital course: Christy Curry is a 68 y.o. female with medical history significant of ESRD-HD (MWF), aortic stenosis (s/p of TAVR), DVT on Eliquis , HTN,  HLD, DM, CAD, dCHF, anemia, esophageal thickening, who presents with nausea, vomiting, abdominal pain, fever, dizziness, syncope.  Upon arriving to hospital, patient had temperature of 101.4, heart rate 110, respirate 30.  Lactic acid now normal.  CT scan showed gallstones with cholecystitis.  Patient seen by general surgery, started on Rocephin  and Flagyl .   Principal Problem:   Acute cholecystitis Active Problems:   Severe aortic stenosis   Sepsis (HCC)   Essential hypertension   Syncope and collapse   Hypertensive urgency   CAD (coronary artery disease)   Myocardial injury   Hyperlipidemia   Pulmonary nodules   ESRD on dialysis (HCC)   Hyperkalemia   Esophageal thickening   Type II diabetes mellitus with renal manifestations (HCC)   DVT (deep venous thrombosis) (HCC)   Penetrating ulcer of aorta (HCC)   Thyroid  nodule   Assessment and Plan: Sepsis due to acute cholecystitis Gallstone with cholecystitis.:  pt meets criteria for sepsis with fever 101.4, heart rate up to 110, RR up to 30.  Lactic acid 1.0 --> 1.4.   Patient seen by general surgery, decision was made to treat medically first.  If condition does not improve, may consider cholecystostomy. On Rocephin  and Flagyl .  Due to n.p.o. status, patient continued on low-dose IV fluids. Patient condition appears to be improving, white cell count has normalized.   General surgery has started a liquid diet, condition improved.  Continue antibiotics for now.     Essential hypertension and hypertensive urgency:   Patient is on Coreg , I have restarted the patient's home dose of clonidine .  Blood pressure  still running high.   Syncope and collapse. Severe aortic stenosis status post TAVR. Patient had another episode of syncope 7/27, patient walked 3 laps in the hallway, then started having nausea, heart rate dropped down to 28.  Patient lost consciousness for 5 seconds.  Regained consciousness with heart rate increased to 88. Reviewed the most recent echocardiogram performed in 06/2023, ejection fraction 50 to 55% with severe aortic stenosis, grade 3 diastolic dysfunction.  Patient states that she has had a TEVAR on 07/2023.  I will place patient on telemetry, repeat echocardiogram to look at aortic valve.   CAD (coronary artery disease) and Myocardial injury: Troponin 126 --> 117. - Aspirin , Lipitor  No concern for ACS.   Hyperlipidemia -Lipitor    Pulmonary nodules: This is incidental findings by CTA - Follow up with PCP as outpatient   ESRD on dialysis (MWF) Hyperkalemia secondary to end-stage renal disease. Continue dialysis Monday Wednesday and Friday.     Esophageal thickening: This is chronic issue. -Patient need to follow-up with PCP and GI as outpatient workup   Type II diabetes mellitus with renal manifestations (HCC) With hypoglycemia of 67.: Recent A1c 5.2, well-controlled.  Patient is taking NovoLog  I will discontinue sliding scale insulin  as patient glucose has not been running high.   DVT (deep venous thrombosis) (HCC) -Switched Eliquis  to IV heparin  for possible surgery, monitor CBC.   Penetrating ulcer of aorta (HCC): CTA redemonstrated small penetrating aortic ulcer at the origin of the left subclavian artery, grossly stable. -need to follow-up with PCP and vascular surgeon as outpatient  Subjective:  Patient had a syncope episode yesterday, doing better today.  No abdominal pain or nausea vomiting.  Starting on liquid diet, tolerating.  Physical Exam: Vitals:   01/17/24 1000 01/17/24 1030 01/17/24 1100 01/17/24 1130  BP: (!) 184/60 (!) 178/65 (!) 177/59  (!) 168/80  Pulse: 81 79 78 79  Resp: 17 16 15 15   Temp:      TempSrc:      SpO2: 100% 100% 100% 100%  Weight:      Height:       General exam: Appears calm and comfortable  Respiratory system: Clear to auscultation. Respiratory effort normal. Cardiovascular system: S1 & S2 heard, RRR. No JVD, murmurs, rubs, gallops or clicks. No pedal edema. Gastrointestinal system: Abdomen is nondistended, soft and nontender. No organomegaly or masses felt. Normal bowel sounds heard. Central nervous system: Alert and oriented. No focal neurological deficits. Extremities: Symmetric 5 x 5 power. Skin: No rashes, lesions or ulcers Psychiatry: Judgement and insight appear normal. Mood & affect appropriate.    Data Reviewed:  Lab results reviewed, recent echocardiogram reviewed.  Family Communication: Daughter updated over the phone.  Disposition: Status is: Inpatient Remains inpatient appropriate because: Severity of disease, IV treatment.     Time spent: 55 minutes  Author: Murvin Mana, MD 01/17/2024 11:37 AM  For on call review [email or web address].

## 2024-01-17 NOTE — Progress Notes (Signed)
   01/17/24 0700  Spiritual Encounters  Type of Visit Initial  Care provided to: Patient  Conversation partners present during encounter Nurse  Referral source Nurse (RN/NT/LPN)  Reason for visit Advance directives  OnCall Visit Yes   Chaplain visited patient in response to a [place] Consult for an AD.  Chaplain explained the document that patient had already completed.  Chaplain recognizes some concerns shared by patient and staff and will share those with Chaplain colleague who will work to secure a Conservation officer, nature and 2 witnesses.   Due to the early hour of the day, Chaplain doesn't have a volunteer pool to pull witnesses from and the notary the Chaplain called Mel - Mother[employer]Baby) isn't available.  Chaplain spoke to overnight Nurse and today's Charge Nurse about the situation and shared with patient that this is being worked on.  Patient shared she may be in dialysis at some point today and Chaplain will let colleague know.      Rev. Rana M. Nicholaus, M.Div. Chaplain Resident [facility]

## 2024-01-17 NOTE — Progress Notes (Signed)
   01/17/24 0834  Hepatitis B Pre Treatment Patient Checks  Hepatitis B Surface Antigen Results Unknown  Patient's Immunity Status Unknown  Isolation Initiated Unknown Hepatitis status   Will drawn labs during initial assessment. Will seek out labs from outside clinic. Hepatitis B unknown protocol followed.

## 2024-01-17 NOTE — Progress Notes (Signed)
 Pt glucose checked and resulted 64, pt given apple juice, pt glucose rechecked, resulted 91.

## 2024-01-17 NOTE — Progress Notes (Signed)
 CC: Cholecystitis Subjective: Patient seen on dialysis today.  She reports that she has very minimal pain in the right upper quadrant and it is much improved from when she came into the hospital.  She tolerated a clear liquid diet without increased pain, nausea or vomiting.  Objective: Vital signs in last 24 hours: Temp:  [98.1 F (36.7 C)-98.8 F (37.1 C)] 98.4 F (36.9 C) (07/28 0934) Pulse Rate:  [77-92] 79 (07/28 1130) Resp:  [15-20] 15 (07/28 1130) BP: (141-191)/(55-80) 168/80 (07/28 1130) SpO2:  [97 %-100 %] 100 % (07/28 1130) Weight:  [54.2 kg-57.6 kg] 57.6 kg (07/28 0934) Last BM Date : 01/13/24  Intake/Output from previous day: 07/27 0701 - 07/28 0700 In: 1789.9 [P.O.:720; I.V.:727.7; IV Piggyback:342.1] Out: 300 [Urine:200; Emesis/NG output:100] Intake/Output this shift: No intake/output data recorded.  Physical exam:  Alert and oriented x 3, normal work of breathing on room air, abdomen is soft, still has some minor tenderness in the right upper quadrant without any rebound tenderness or guarding.  There is no more tenderness in the right lower quadrant. Lab Results: CBC  Recent Labs    01/16/24 0623 01/17/24 0450  WBC 8.4 6.8  HGB 9.2* 9.0*  HCT 30.9* 30.8*  PLT 158 181   BMET Recent Labs    01/16/24 0623 01/17/24 0945  NA 137 138  K 4.1 4.0  CL 101 100  CO2 25 21*  GLUCOSE 95 173*  BUN 46* 54*  CREATININE 7.43* 8.95*  CALCIUM  8.6* 8.6*   PT/INR Recent Labs    01/15/24 0203  LABPROT 20.5*  INR 1.7*   ABG No results for input(s): PHART, HCO3 in the last 72 hours.  Invalid input(s): PCO2, PO2  Studies/Results: No results found.   Anti-infectives: Anti-infectives (From admission, onward)    Start     Dose/Rate Route Frequency Ordered Stop   01/15/24 2200  cefTRIAXone  (ROCEPHIN ) 2 g in sodium chloride  0.9 % 100 mL IVPB        2 g 200 mL/hr over 30 Minutes Intravenous Every 24 hours 01/15/24 0100     01/15/24 0800   metroNIDAZOLE  (FLAGYL ) IVPB 500 mg        500 mg 100 mL/hr over 60 Minutes Intravenous Every 12 hours 01/15/24 0100     01/14/24 2300  cefTRIAXone  (ROCEPHIN ) 2 g in sodium chloride  0.9 % 100 mL IVPB        2 g 200 mL/hr over 30 Minutes Intravenous Once 01/14/24 2250 01/14/24 2341   01/14/24 2300  metroNIDAZOLE  (FLAGYL ) IVPB 500 mg        500 mg 100 mL/hr over 60 Minutes Intravenous  Once 01/14/24 2250 01/15/24 0155       Assessment/Plan:  Patient with acute cholecystitis.  Vital signs have all normalized and she is feeling better.  Continue on antibiotics.  She tolerated a clear liquid diet.  We will advance her to a renal diet today.  If she tolerates this hopefully can go home tomorrow.  Recommend a total of 7 days of antibiotics.  I will schedule a follow-up for her with her office in 4 to 6 weeks to discuss potential need for cholecystectomy.  A total of 35 minutes was spent reviewing the patient's chart, performing an interval history and physical and discussing treatment options with the patient  Jayson Endow, M.D. [facility]

## 2024-01-17 NOTE — Plan of Care (Signed)

## 2024-01-17 NOTE — Progress Notes (Signed)
 ANTICOAGULATION CONSULT NOTE  Pharmacy Consult for heparin  infusion Indication: DVT  Allergies  Allergen Reactions   2,4-D Dimethylamine Swelling   Other Swelling   Tetanus Antitoxin Itching   Tetanus Toxoid    Tetanus Toxoids Itching   Patient Measurements: Height: 5' 1 (154.9 cm) Weight: 54.2 kg (119 lb 7.8 oz) IBW/kg (Calculated) : 47.8 HEPARIN  DW (KG): 56.2  Vital Signs: Temp: 98.1 F (36.7 C) (07/27 2346) BP: 151/72 (07/27 2346) Pulse Rate: 77 (07/27 2346)  Labs: Recent Labs    01/15/24 0203 01/15/24 0604 01/15/24 0836 01/15/24 1055 01/15/24 2006 01/16/24 0623 01/17/24 0450  HGB 10.6*  --   --   --   --  9.2* 9.0*  HCT 34.8*  --   --   --   --  30.9* 30.8*  PLT 170  --   --   --   --  158 181  APTT 36  --   --  106* 85*  --   --   LABPROT 20.5*  --   --   --   --   --   --   INR 1.7*  --   --   --   --   --   --   HEPARINUNFRC 0.45  --   --   --   --  0.59 0.53  CREATININE 5.29*  --   --   --   --  7.43*  --   TROPONINIHS 141* 146* 142*  --   --   --   --    Estimated Creatinine Clearance: 5.4 mL/min (A) (by C-G formula based on SCr of 7.43 mg/dL (H)).  Medical History: Past Medical History:  Diagnosis Date   Anemia of chronic disease    Aortic stenosis    Diabetes mellitus with complication (HCC)    Diabetic angiopathy (HCC)    Diabetic retinopathy (HCC)    ESRD (end stage renal disease) (HCC)    HLD (hyperlipidemia)    Hypertension    RBBB    Medications:  PTA Meds: Eliquis  5 mg BID, last dose 01/14/24 in AM  Assessment: Pt is a 68 yo female with hx of DVT on Eliquis  presenting to ED, now has cholecystisis, may need procedure.   Labs:  7/26 1055   aPTT 106/HL 0.45 - not correlating  7/26 2006   aPTT 85/HL 0.45 - correlating 7/27 0623  HL 0.59, therapeutic x 2 7/28 0450  HL 0.53, therapeutic x 3   Goal of Therapy:  Heparin  level 0.3-0.7 units/ml aPTT 66-102 seconds Monitor platelets by anticoagulation protocol: Yes   Plan:  Continue  heparin  infusion at current rate of 850 units/hr  Recheck HL daily w/ AM labs while therapeutic  Monitor CBC daily while on heparin   Rankin CANDIE Dills, PharmD, [facility] 01/17/2024 6:05 AM

## 2024-01-17 NOTE — Progress Notes (Signed)
 . [facility]  ROUNDING NOTE   Subjective:   Patient well-known to us  as we follow her for outpatient hemodialysis treatments. She came in with presyncopal symptoms as well as nausea and vomiting.  Patient is known to our practice and receives outpatient dialysis treatments at [facility] on a MWF schedule, supervised by Dr. Marcelino.  Patient seen and evaluated during dialysis   HEMODIALYSIS FLOWSHEET:  Blood Flow Rate (mL/min): 349 mL/min Arterial Pressure (mmHg): -273.12 mmHg Venous Pressure (mmHg): 155.14 mmHg TMP (mmHg): 15.55 mmHg Ultrafiltration Rate (mL/min): 284 mL/min Dialysate Flow Rate (mL/min): 299 ml/min Dialysis Fluid Bolus: Normal Saline Bolus Amount (mL): 300 mL  Experienced some hypoglycemia this morning States she feels better than yesterday.   Objective:  Vital signs in last 24 hours:  Temp:  [98.1 F (36.7 C)-98.8 F (37.1 C)] 98.4 F (36.9 C) (07/28 0934) Pulse Rate:  [77-92] 79 (07/28 1130) Resp:  [15-20] 15 (07/28 1130) BP: (141-191)/(55-80) 168/80 (07/28 1130) SpO2:  [97 %-100 %] 100 % (07/28 1130) Weight:  [54.2 kg-57.6 kg] 57.6 kg (07/28 0934)  Weight change: -1.184 kg Filed Weights   01/16/24 0500 01/17/24 0500 01/17/24 0934  Weight: 55.4 kg 54.2 kg 57.6 kg    Intake/Output: I/O last 3 completed shifts: In: 2396.7 [P.O.:720; I.V.:1241; IV Piggyback:435.6] Out: 300 [Urine:200; Emesis/NG output:100]   Intake/Output this shift:  No intake/output data recorded.  Physical Exam: General: No acute distress, frail  Head: Normocephalic, atraumatic. Moist oral mucosal membranes  Neck: Supple  Lungs:  Diminished  Heart: S1S2 no rubs  Abdomen:  Soft, nontender, bowel sounds present  Extremities: Trace peripheral edema.  Neurologic: Awake, alert, following commands  Skin: No acute rash  Access: Left upper extremity AV graft    Basic Metabolic Panel: Recent Labs  Lab 01/14/24 0426 01/15/24 0203 01/16/24 0623  01/17/24 0945  NA 142 135 137 138  K 5.2* 4.0 4.1 4.0  CL 102 94* 101 100  CO2 29 27 25  21*  GLUCOSE 159* 204* 95 173*  BUN 45* 31* 46* 54*  CREATININE 7.21* 5.29* 7.43* 8.95*  CALCIUM  9.4 8.8* 8.6* 8.6*  PHOS  --   --   --  7.0*    Liver Function Tests: Recent Labs  Lab 01/14/24 0426 01/17/24 0945  AST 35  --   ALT 12  --   ALKPHOS 114  --   BILITOT 1.0  --   PROT 6.1*  --   ALBUMIN 3.2* 2.5*   No results for input(s): LIPASE, AMYLASE in the last 168 hours. No results for input(s): AMMONIA in the last 168 hours.  CBC: Recent Labs  Lab 01/14/24 0426 01/15/24 0203 01/16/24 0623 01/17/24 0450  WBC 10.2 13.2* 8.4 6.8  NEUTROABS 8.1*  --   --   --   HGB 10.7* 10.6* 9.2* 9.0*  HCT 36.3 34.8* 30.9* 30.8*  MCV 98.4 95.9 97.2 98.4  PLT 180 170 158 181    Cardiac Enzymes: No results for input(s): CKTOTAL, CKMB, CKMBINDEX, TROPONINI in the last 168 hours.  BNP: Invalid input(s): POCBNP  CBG: Recent Labs  Lab 01/16/24 1632 01/16/24 2139 01/17/24 0803 01/17/24 0843 01/17/24 0914  GLUCAP 106* 159* 67* 67* 202*    Microbiology: Results for orders placed or performed during the hospital encounter of 01/14/24  Urine Culture     Status: Abnormal   Collection Time: 01/14/24  9:53 PM   Specimen: Urine, Random  Result Value Ref Range Status   Specimen Description  Final    URINE, RANDOM Performed at [facility], [address] [place], [place] [address]    Special Requests   Final    NONE Reflexed from [id number] Performed at [facility], [address] [place], [place] [address]    Culture MULTIPLE SPECIES PRESENT, SUGGEST RECOLLECTION (A)  Final   Report Status 01/16/2024 FINAL  Final  Blood Culture (routine x 2)     Status: None (Preliminary result)   Collection Time: 01/14/24 11:32 PM   Specimen: BLOOD  Result Value Ref Range Status   Specimen Description BLOOD BLOOD RIGHT HAND  Final   Special Requests   Final     BOTTLES DRAWN AEROBIC AND ANAEROBIC Blood Culture adequate volume   Culture   Final    NO GROWTH 3 DAYS Performed at [facility], [address], [place], [place] [address]    Report Status PENDING  Incomplete  Blood Culture (routine x 2)     Status: None (Preliminary result)   Collection Time: 01/14/24 11:32 PM   Specimen: BLOOD  Result Value Ref Range Status   Specimen Description BLOOD BLOOD RIGHT HAND  Final   Special Requests   Final    BOTTLES DRAWN AEROBIC AND ANAEROBIC Blood Culture results may not be optimal due to an inadequate volume of blood received in culture bottles   Culture   Final    NO GROWTH 3 DAYS Performed at [facility], [address] [place], [place] [address]    Report Status PENDING  Incomplete    Coagulation Studies: Recent Labs    01/15/24 0203  LABPROT 20.5*  INR 1.7*    Urinalysis: Recent Labs    01/14/24 2153  COLORURINE YELLOW*  LABSPEC 1.018  PHURINE 9.0*  GLUCOSEU 150*  HGBUR NEGATIVE  BILIRUBINUR NEGATIVE  KETONESUR NEGATIVE  PROTEINUR >=300*  NITRITE NEGATIVE  LEUKOCYTESUR TRACE*      Imaging: No results found.    Medications:    cefTRIAXone  (ROCEPHIN )  IV 2 g (01/16/24 2150)   metronidazole  500 mg (01/16/24 2032)   promethazine  (PHENERGAN ) injection (IM or IVPB) 12.5 mg (01/16/24 2331)    apixaban   5 mg Oral BID   aspirin  EC  81 mg Oral Daily   atorvastatin   80 mg Oral QHS   carvedilol   25 mg Oral BID WC   Chlorhexidine  Gluconate Cloth  6 each Topical Q0600   cloNIDine   0.1 mg Oral BID   feeding supplement  1 Container Oral TID BM   lidocaine   1 patch Transdermal Daily   losartan   25 mg Oral Daily   multivitamin  1 tablet Oral QHS   ondansetron  (ZOFRAN ) IV  4 mg Intravenous Once   pantoprazole   40 mg Intravenous Daily   acetaminophen , albuterol , bisacodyl , dextromethorphan-guaiFENesin , haloperidol  lactate, hydrALAZINE , morphine  injection, ondansetron  (ZOFRAN ) IV, oxyCODONE -acetaminophen ,  promethazine  (PHENERGAN ) injection (IM or IVPB), traZODone   Assessment/ Plan:  68 y.o. female with past medical history of ESRD on HD, hypertension, diabetes mellitus type 2, DVT, aortic stenosis status post TAVR 08/19/2023, anemia of chronic disease, secondary hyperparathyroidism who presented with presyncopal symptoms and severe hypertension.  [facility]/MWF/left AVG  1.  ESRD on HD MWF.  Receiving Alysis today, low to no UF.  Next treatment scheduled for Wednesday.  2.  Hypertension.  Systolic blood pressure was 220 upon admission.  Blood pressure 168/80 during dialysis.  Will schedule losartan  in the evening.  3.  Anemia of chronic kidney disease.  Hemoglobin stable, 9.0.  Patient receives Mircera at outpatient clinic.  Due to elevated blood pressure, will hold ESA today.  4.  Hyperkalemia.  Serum potassium 4.0, will manage with dialysis.  5.  Septic, acute cholecystitis.  Currently receiving medical management per general surgery.  If condition does not improve, will consider cholecystectomy.  Currently responding appropriately to antibiotics.   LOS: 3 Jakeim Sedore 7/28/202512:01 PM

## 2024-01-18 ENCOUNTER — Inpatient Hospital Stay (HOSPITAL_COMMUNITY)
Admit: 2024-01-18 | Discharge: 2024-01-18 | Disposition: A | Discharge status: Home / Self Care | Attending: Internal Medicine

## 2024-01-18 DIAGNOSIS — A419 Sepsis, unspecified organism: Secondary | ICD-10-CM | POA: Diagnosis not present

## 2024-01-18 DIAGNOSIS — R55 Syncope and collapse: Secondary | ICD-10-CM | POA: Diagnosis not present

## 2024-01-18 DIAGNOSIS — K810 Acute cholecystitis: Secondary | ICD-10-CM | POA: Diagnosis not present

## 2024-01-18 LAB — CBC
HCT: 31.4 % — ABNORMAL LOW (ref 36.0–46.0)
Hemoglobin: 9.6 g/dL — ABNORMAL LOW (ref 12.0–15.0)
MCH: 29.2 pg (ref 26.0–34.0)
MCHC: 30.6 g/dL (ref 30.0–36.0)
MCV: 95.4 fL (ref 80.0–100.0)
Platelets: 174 K/uL (ref 150–400)
RBC: 3.29 MIL/uL — ABNORMAL LOW (ref 3.87–5.11)
RDW: 15.5 % (ref 11.5–15.5)
WBC: 5 K/uL (ref 4.0–10.5)
nRBC: 0 % (ref 0.0–0.2)

## 2024-01-18 LAB — GLUCOSE, CAPILLARY
Glucose-Capillary: 115 mg/dL — ABNORMAL HIGH (ref 70–99)
Glucose-Capillary: 154 mg/dL — ABNORMAL HIGH (ref 70–99)
Glucose-Capillary: 188 mg/dL — ABNORMAL HIGH (ref 70–99)

## 2024-01-18 LAB — ECHOCARDIOGRAM COMPLETE
AR max vel: 1.35 cm2
AV Area VTI: 1.6 cm2
AV Area mean vel: 1.41 cm2
AV Mean grad: 10.3 mmHg
AV Peak grad: 18.8 mmHg
Ao pk vel: 2.17 m/s
Area-P 1/2: 3.87 cm2
Height: 61 in
MV VTI: 2.07 cm2
S' Lateral: 2.8 cm
Weight: 2134.05 [oz_av]

## 2024-01-18 MED ORDER — NEPRO/CARBSTEADY PO LIQD
237.0000 mL | Freq: Two times a day (BID) | ORAL | Status: DC
  Administered 2024-01-18 (×2): 237 mL via ORAL

## 2024-01-18 MED ORDER — AMOXICILLIN-POT CLAVULANATE 500-125 MG PO TABS: 1.0000 | ORAL_TABLET | Freq: Every day | ORAL | 0 refills | Status: DC

## 2024-01-18 MED ORDER — CLOPIDOGREL BISULFATE 75 MG PO TABS: 75.0000 mg | ORAL_TABLET | Freq: Every day | ORAL | Status: DC

## 2024-01-18 MED ORDER — CLONIDINE HCL 0.2 MG PO TABS: 0.1000 mg | ORAL_TABLET | Freq: Two times a day (BID) | ORAL | 0 refills | Status: DC

## 2024-01-18 MED ORDER — PANTOPRAZOLE SODIUM 40 MG PO TBEC: 40.0000 mg | DELAYED_RELEASE_TABLET | Freq: Every day | ORAL | 0 refills | Status: AC

## 2024-01-18 MED ORDER — PANTOPRAZOLE SODIUM 40 MG PO TBEC: 40.0000 mg | DELAYED_RELEASE_TABLET | Freq: Every day | ORAL | Status: DC

## 2024-01-18 NOTE — Progress Notes (Signed)
 Pt alert and oriented. Discharge instructions reviewed with pt. Pt verbalized understanding. Pt verbalized understanding of need to schedule/attend follow up appointments. Pt verbalized understanding of need to pick up prescriptions from pharmacy. IV removed. IV site WNL. Pt left unit in wheelchair with NT. Pt left hospital with family.

## 2024-01-18 NOTE — Progress Notes (Signed)
 . [facility]  ROUNDING NOTE   Subjective:   Patient well-known to us  as we follow her for outpatient hemodialysis treatments. She came in with presyncopal symptoms as well as nausea and vomiting.  Patient is known to our practice and receives outpatient dialysis treatments at [facility] on a MWF schedule, supervised by Dr. Marcelino.  Update Patient sitting up in bed Tolerating small meals.  Denies discomfort at this time  Objective:  Vital signs in last 24 hours:  Temp:  [97.9 F (36.6 C)-99.3 F (37.4 C)] 98.6 F (37 C) (07/29 1213) Pulse Rate:  [72-88] 86 (07/29 1213) Resp:  [18-20] 20 (07/29 1213) BP: (142-182)/(50-92) 147/87 (07/29 1213) SpO2:  [99 %-100 %] 100 % (07/29 1213) FiO2 (%):  [21 %] 21 % (07/28 2228) Weight:  [60.5 kg] 60.5 kg (07/29 0326)  Weight change: 3.4 kg Filed Weights   01/17/24 0934 01/17/24 1333 01/18/24 0326  Weight: 57.6 kg 57.8 kg 60.5 kg    Intake/Output: I/O last 3 completed shifts: In: 1038.7 [P.O.:240; I.V.:142; IV Piggyback:656.8] Out: 475 [Urine:375; Emesis/NG output:100]   Intake/Output this shift:  Total I/O In: -  Out: 150 [Urine:150]  Physical Exam: General: No acute distress, frail  Head: Normocephalic, atraumatic. Moist oral mucosal membranes  Neck: Supple  Lungs:  Diminished  Heart: S1S2 no rubs  Abdomen:  Soft, nontender, bowel sounds present  Extremities: Trace peripheral edema.  Neurologic: Awake, alert, following commands  Skin: No acute rash  Access: Left upper extremity AV graft    Basic Metabolic Panel: Recent Labs  Lab 01/14/24 0426 01/15/24 0203 01/16/24 0623 01/17/24 0945  NA 142 135 137 138  K 5.2* 4.0 4.1 4.0  CL 102 94* 101 100  CO2 29 27 25  21*  GLUCOSE 159* 204* 95 173*  BUN 45* 31* 46* 54*  CREATININE 7.21* 5.29* 7.43* 8.95*  CALCIUM  9.4 8.8* 8.6* 8.6*  PHOS  --   --   --  7.0*    Liver Function Tests: Recent Labs  Lab 01/14/24 0426 01/17/24 0945  AST 35  --    ALT 12  --   ALKPHOS 114  --   BILITOT 1.0  --   PROT 6.1*  --   ALBUMIN 3.2* 2.5*   No results for input(s): LIPASE, AMYLASE in the last 168 hours. No results for input(s): AMMONIA in the last 168 hours.  CBC: Recent Labs  Lab 01/14/24 0426 01/15/24 0203 01/16/24 0623 01/17/24 0450 01/18/24 0620  WBC 10.2 13.2* 8.4 6.8 5.0  NEUTROABS 8.1*  --   --   --   --   HGB 10.7* 10.6* 9.2* 9.0* 9.6*  HCT 36.3 34.8* 30.9* 30.8* 31.4*  MCV 98.4 95.9 97.2 98.4 95.4  PLT 180 170 158 181 174    Cardiac Enzymes: No results for input(s): CKTOTAL, CKMB, CKMBINDEX, TROPONINI in the last 168 hours.  BNP: Invalid input(s): POCBNP  CBG: Recent Labs  Lab 01/17/24 1445 01/17/24 1716 01/17/24 2126 01/18/24 0717 01/18/24 1113  GLUCAP 91 144* 149* 115* 154*    Microbiology: Results for orders placed or performed during the hospital encounter of 01/14/24  Urine Culture     Status: Abnormal   Collection Time: 01/14/24  9:53 PM   Specimen: Urine, Random  Result Value Ref Range Status   Specimen Description   Final    URINE, RANDOM Performed at [facility], [address] [place], [place] [address]    Special Requests   Final  NONE Reflexed from [id number] Performed at [facility], [address] [place], [place] [address]    Culture MULTIPLE SPECIES PRESENT, SUGGEST RECOLLECTION (A)  Final   Report Status 01/16/2024 FINAL  Final  Blood Culture (routine x 2)     Status: None (Preliminary result)   Collection Time: 01/14/24 11:32 PM   Specimen: BLOOD  Result Value Ref Range Status   Specimen Description BLOOD BLOOD RIGHT HAND  Final   Special Requests   Final    BOTTLES DRAWN AEROBIC AND ANAEROBIC Blood Culture adequate volume   Culture   Final    NO GROWTH 4 DAYS Performed at [facility], [address], [place], [place] [address]    Report Status PENDING  Incomplete  Blood Culture (routine x 2)     Status: None (Preliminary  result)   Collection Time: 01/14/24 11:32 PM   Specimen: BLOOD  Result Value Ref Range Status   Specimen Description BLOOD BLOOD RIGHT HAND  Final   Special Requests   Final    BOTTLES DRAWN AEROBIC AND ANAEROBIC Blood Culture results may not be optimal due to an inadequate volume of blood received in culture bottles   Culture   Final    NO GROWTH 4 DAYS Performed at [facility], [address] [place], [place] [address]    Report Status PENDING  Incomplete    Coagulation Studies: No results for input(s): LABPROT, INR in the last 72 hours.   Urinalysis: No results for input(s): COLORURINE, LABSPEC, PHURINE, GLUCOSEU, HGBUR, BILIRUBINUR, KETONESUR, PROTEINUR, UROBILINOGEN, NITRITE, LEUKOCYTESUR in the last 72 hours.  Invalid input(s): APPERANCEUR     Imaging: No results found.    Medications:    cefTRIAXone  (ROCEPHIN )  IV Stopped (01/17/24 2232)   metronidazole  500 mg (01/18/24 0910)   promethazine  (PHENERGAN ) injection (IM or IVPB) Stopped (01/16/24 2352)    apixaban   5 mg Oral BID   atorvastatin   80 mg Oral QHS   carvedilol   25 mg Oral BID WC   Chlorhexidine  Gluconate Cloth  6 each Topical Q0600   cloNIDine   0.1 mg Oral BID   [START ON 01/19/2024] clopidogrel   75 mg Oral Daily   feeding supplement (NEPRO CARB STEADY)  237 mL Oral BID BM   lidocaine   1 patch Transdermal Daily   losartan   50 mg Oral Daily   melatonin  5 mg Oral QHS   multivitamin  1 tablet Oral QHS   ondansetron  (ZOFRAN ) IV  4 mg Intravenous Once   [START ON 01/19/2024] pantoprazole   40 mg Oral Daily   acetaminophen , albuterol , bisacodyl , dextromethorphan-guaiFENesin , haloperidol  lactate, hydrALAZINE , ondansetron  (ZOFRAN ) IV, oxyCODONE -acetaminophen , promethazine  (PHENERGAN ) injection (IM or IVPB), traZODone   Assessment/ Plan:  68 y.o. female with past medical history of ESRD on HD, hypertension, diabetes mellitus type 2, DVT, aortic stenosis status post TAVR  08/19/2023, anemia of chronic disease, secondary hyperparathyroidism who presented with presyncopal symptoms and severe hypertension.  [facility]/MWF/left AVG  1.  ESRD on HD MWF. Next treatment scheduled for Wednesday.  2.  Hypertension.  Systolic blood pressure was 220 upon admission. Prescribed Carvedilol , clonidine  and losartan   Blood pressure 147/87  3.  Anemia of chronic kidney disease.  Hemoglobin 9.6.  With improved blood pressure control, can consider ESA when needed.  4.  Hyperkalemia.  Corrected with dialysis  5.  Septic, acute cholecystitis.  Currently receiving medical management per general surgery.  If condition does not improve, will consider cholecystectomy.  Currently responding appropriately to antibiotics  and supportive measures.   LOS: 4 Christy Curry 7/29/20252:28 PM

## 2024-01-18 NOTE — Progress Notes (Signed)
*  PRELIMINARY RESULTS* Echocardiogram 2D Echocardiogram has been performed.  Floydene Harder 01/18/2024, 8:55 AM

## 2024-01-18 NOTE — Progress Notes (Signed)
 CC: cholecystitis Subjective: Feels well main complaint is Right dised chest wall pain, no abd pain. Nml wbc  Objective: Vital signs in last 24 hours: Temp:  [97.6 F (36.4 C)-99.3 F (37.4 C)] 98.2 F (36.8 C) (07/29 0714) Pulse Rate:  [72-88] 80 (07/29 0714) Resp:  [14-20] 20 (07/29 0714) BP: (142-207)/(49-92) 151/92 (07/29 0714) SpO2:  [99 %-100 %] 100 % (07/29 0714) FiO2 (%):  [21 %] 21 % (07/28 2228) Weight:  [57.6 kg-60.5 kg] 60.5 kg (07/29 0326) Last BM Date : 01/13/24  Intake/Output from previous day: 07/28 0701 - 07/29 0700 In: 636.3 [P.O.:240; I.V.:81.6; IV Piggyback:314.6] Out: 175 [Urine:175] Intake/Output this shift: No intake/output data recorded.  Physical exam:  NAD Chronically ill Alert and oriented x 3 Abdomen is soft, some minor tenderness Right chest wall w/o abdominal tenderness, without any rebound tenderness or guarding.  No masses Ext: warm and well perfused   Lab Results: CBC  Recent Labs    01/17/24 0450 01/18/24 0620  WBC 6.8 5.0  HGB 9.0* 9.6*  HCT 30.8* 31.4*  PLT 181 174   BMET Recent Labs    01/16/24 0623 01/17/24 0945  NA 137 138  K 4.1 4.0  CL 101 100  CO2 25 21*  GLUCOSE 95 173*  BUN 46* 54*  CREATININE 7.43* 8.95*  CALCIUM  8.6* 8.6*   PT/INR No results for input(s): LABPROT, INR in the last 72 hours. ABG No results for input(s): PHART, HCO3 in the last 72 hours.  Invalid input(s): PCO2, PO2  Studies/Results: No results found.  Anti-infectives: Anti-infectives (From admission, onward)    Start     Dose/Rate Route Frequency Ordered Stop   01/15/24 2200  cefTRIAXone  (ROCEPHIN ) 2 g in sodium chloride  0.9 % 100 mL IVPB        2 g 200 mL/hr over 30 Minutes Intravenous Every 24 hours 01/15/24 0100     01/15/24 0800  metroNIDAZOLE  (FLAGYL ) IVPB 500 mg        500 mg 100 mL/hr over 60 Minutes Intravenous Every 12 hours 01/15/24 0100     01/14/24 2300  cefTRIAXone  (ROCEPHIN ) 2 g in sodium chloride  0.9 %  100 mL IVPB        2 g 200 mL/hr over 30 Minutes Intravenous Once 01/14/24 2250 01/14/24 2341   01/14/24 2300  metroNIDAZOLE  (FLAGYL ) IVPB 500 mg        500 mg 100 mL/hr over 60 Minutes Intravenous  Once 01/14/24 2250 01/15/24 0155       Assessment/Plan: Cholecystitis responding to a/bs , may complete a/bs as outpt and we will see her back in the outpt setting no need for surgical intervention I personally spent a total of 35 minutes in the care of the patient today including performing a medically appropriate exam/evaluation, counseling and educating, placing orders, referring and communicating with other health care professionals, documenting clinical information in the EHR, independently interpreting and reviewing images studies and coordinating care.     Laneta Luna, MD, [facility]  01/18/2024

## 2024-01-18 NOTE — Discharge Summary (Signed)
 Physician Discharge Summary   Patient: Christy Curry MRN: [id number] DOB: 29-Nov-1955  Admit date:     01/14/2024  Discharge date: 01/18/24  Discharge Physician: Murvin Mana   PCP: Gwenyth Barnacle, MD   Recommendations at discharge:   With PCP in 1 week. Follow-up with cardiology in [facility] as scheduled. Follow-up with general surgery as scheduled.  Discharge Diagnoses: Principal Problem:   Cholecystitis, acute Active Problems:   Severe aortic stenosis   Sepsis (HCC)   Essential hypertension   Syncope and collapse   Hypertensive urgency   CAD (coronary artery disease)   Myocardial injury   Hyperlipidemia   Pulmonary nodules   ESRD on dialysis (HCC)   Hyperkalemia   Esophageal thickening   Type II diabetes mellitus with renal manifestations (HCC)   DVT (deep venous thrombosis) (HCC)   Penetrating ulcer of aorta (HCC)   Thyroid  nodule  Resolved Problems:   * No resolved hospital problems. *  Hospital Course: Christy Curry is a 68 y.o. female with medical history significant of ESRD-HD (MWF), aortic stenosis (s/p of TAVR), DVT on Eliquis , HTN,  HLD, DM, CAD, dCHF, anemia, esophageal thickening, who presents with nausea, vomiting, abdominal pain, fever, dizziness, syncope.  Upon arriving to hospital, patient had temperature of 101.4, heart rate 110, respirate 30.  Lactic acid now normal.  CT scan showed gallstones with cholecystitis.  Patient seen by general surgery, started on Rocephin  and Flagyl . Patient has improved, patient no longer having fever, abdominal pain resolved.  Patient medically stable for discharge.  Continue complete course with Augmentin . Assessment and Plan:  Sepsis due to acute cholecystitis Gallstone with cholecystitis.:  pt meets criteria for sepsis with fever 101.4, heart rate up to 110, RR up to 30.  Lactic acid 1.0 --> 1.4.   Patient seen by general surgery, decision was made to treat medically first.  If condition does not improve,  may consider cholecystostomy. On Rocephin  and Flagyl .  Due to n.p.o. status, patient continued on low-dose IV fluids. Patient condition appears to be improving, white cell count has normalized.   Neurosurgery cleared patient for discharge, patient has been tolerating diet.  Medically stable for discharge.  Will continue Augmentin  to finish her course.     Essential hypertension and hypertensive urgency:   Home treatment.   Syncope and collapse secondary to vasovagal reaction. Severe aortic stenosis status post TAVR. Patient had another episode of syncope 7/27, patient walked 3 laps in the hallway, then started having nausea, heart rate dropped down to 28.  Patient lost consciousness for 5 seconds.  Regained consciousness with heart rate increased to 88. Reviewed the most recent echocardiogram performed in 06/2023, ejection fraction 50 to 55% with severe aortic stenosis, grade 3 diastolic dysfunction.  Patient states that she has had a TAVR on 07/2023.  Repeat echocardiogram showed normal ejection fraction, no aortic stenosis anymore.  Patient will follow-up with the [facility] cardiology as outpatient.     CAD (coronary artery disease) and Myocardial injury: Troponin 126 --> 117. - Aspirin , Lipitor  No concern for ACS.   Hyperlipidemia -Lipitor    Pulmonary nodules: This is incidental findings by CTA - Follow up with PCP as outpatient   ESRD on dialysis (MWF) Hyperkalemia secondary to end-stage renal disease. Continue dialysis Monday Wednesday and Friday.     Esophageal thickening: This is chronic issue. -Patient need to follow-up with PCP and GI as outpatient workup   Type II diabetes mellitus with renal manifestations (HCC) With hypoglycemia of 67.: Recent A1c 5.2,  well-controlled.  Patient is taking NovoLog  I will discontinue sliding scale insulin  as patient glucose has not been running high.   DVT (deep venous thrombosis) (HCC) -Switched Eliquis  to IV heparin  for possible surgery,  monitor CBC.   Penetrating ulcer of aorta [facility]): CTA redemonstrated small penetrating aortic ulcer at the origin of the left subclavian artery, grossly stable. -need to follow-up with PCP and vascular surgeon as outpatient          Consultants: General surgery Procedures performed: None  Disposition: Home Diet recommendation:  Discharge Diet Orders (From admission, onward)     Start     Ordered   01/18/24 0000  Diet - low sodium heart healthy        01/18/24 1502           Cardiac diet DISCHARGE MEDICATION: Allergies as of 01/18/2024       Reactions   2,4-d Dimethylamine Swelling   Other Swelling   Tetanus Antitoxin Itching   Tetanus Toxoid    Tetanus Toxoids Itching        Medication List     STOP taking these medications    bisacodyl  10 MG suppository Commonly known as: DULCOLAX   hydrALAZINE  20 MG/ML injection Commonly known as: APRESOLINE    insulin  aspart 100 UNIT/ML injection Commonly known as: novoLOG    labetalol  5 MG/ML injection Commonly known as: NORMODYNE    piperacillin -tazobactam 2-0.25 GM/50ML IVPB Commonly known as: ZOSYN    traZODone  50 MG tablet Commonly known as: DESYREL        TAKE these medications    acetaminophen  325 MG tablet Commonly known as: TYLENOL  Take 2 tablets (650 mg total) by mouth every 6 (six) hours as needed for fever or headache.   amoxicillin -clavulanate 500-125 MG tablet Commonly known as: Augmentin  Take 1 tablet by mouth daily. Take after hemodialysis on dialysis days.   aspirin  EC 81 MG tablet Take 1 tablet (81 mg total) by mouth daily. Swallow whole.   atorvastatin  80 MG tablet Commonly known as: LIPITOR  Take 1 tablet (80 mg total) by mouth at bedtime.   carvedilol  25 MG tablet Commonly known as: COREG  Take 25 mg by mouth 2 (two) times daily with a meal.   Chlorhexidine  Gluconate Cloth 2 % Pads Apply 6 each topically daily at 6 (six) AM.   cloNIDine  0.2 MG tablet Commonly known as:  CATAPRES  Take 0.2 mg by mouth daily.   clopidogrel  75 MG tablet Commonly known as: PLAVIX  Take 75 mg by mouth daily.   docusate sodium  100 MG capsule Commonly known as: COLACE Take 1 capsule (100 mg total) by mouth 2 (two) times daily as needed for mild constipation.   Eliquis  5 MG Tabs tablet Generic drug: apixaban  Take 5 mg by mouth 2 (two) times daily.   feeding supplement (NEPRO CARB STEADY) Liqd Take 237 mLs by mouth 2 (two) times daily.   heparin  5000 UNIT/ML injection Inject 1 mL (5,000 Units total) into the skin every 8 (eight) hours.   ipratropium-albuterol  0.5-2.5 (3) MG/3ML Soln Commonly known as: DUONEB Take 3 mLs by nebulization every 6 (six) hours as needed. What changed: Another medication with the same name was removed. Continue taking this medication, and follow the directions you see here.   ipratropium-albuterol  0.5-2.5 (3) MG/3ML Soln Commonly known as: DUONEB Take 3 mLs by nebulization 4 (four) times daily. What changed: Another medication with the same name was removed. Continue taking this medication, and follow the directions you see here.   losartan  25 MG tablet Commonly  known as: COZAAR  Take 1 tablet by mouth daily.   menthol -cetylpyridinium 3 MG lozenge Commonly known as: CEPACOL Take 1 lozenge (3 mg total) by mouth as needed for sore throat.   Multi-Vitamin tablet Take 1 tablet by mouth daily.   multivitamin Tabs tablet Take 1 tablet by mouth at bedtime.   ondansetron  4 MG/2ML Soln injection Commonly known as: ZOFRAN  Inject 2 mLs (4 mg total) into the vein every 6 (six) hours as needed for nausea or vomiting.   pantoprazole  40 MG injection Commonly known as: PROTONIX  Inject 40 mg into the vein daily.   polyethylene glycol 17 g packet Commonly known as: MIRALAX  / GLYCOLAX  Take 17 g by mouth daily as needed for moderate constipation.   sevelamer  carbonate 0.8 g Pack packet Commonly known as: RENVELA  Take 0.8 g by mouth 3 (three)  times daily with meals.   Zetia  10 MG tablet Generic drug: ezetimibe  Take 10 mg by mouth daily.        Follow-up Information     Marinda Jayson KIDD, MD Follow up in 2 week(s).   Specialty: General Surgery Contact information: [address] [place] [address] [phone]         Gwenyth Barnacle, MD Follow up in 1 week(s).   Specialty: Internal Medicine Contact information: [address] [place] [address] [phone]                Discharge Exam: Filed Weights   01/17/24 0934 01/17/24 1333 01/18/24 0326  Weight: 57.6 kg 57.8 kg 60.5 kg   General exam: Appears calm and comfortable  Respiratory system: Clear to auscultation. Respiratory effort normal. Cardiovascular system: S1 & S2 heard, RRR. No JVD, murmurs, rubs, gallops or clicks. No pedal edema. Gastrointestinal system: Abdomen is nondistended, soft and nontender. No organomegaly or masses felt. Normal bowel sounds heard. Central nervous system: Alert and oriented. No focal neurological deficits. Extremities: Symmetric 5 x 5 power. Skin: No rashes, lesions or ulcers Psychiatry: Judgement and insight appear normal. Mood & affect appropriate.    Condition at discharge: good  The results of significant diagnostics from this hospitalization (including imaging, microbiology, ancillary and laboratory) are listed below for reference.   Imaging Studies: ECHOCARDIOGRAM COMPLETE Result Date: 01/18/2024    ECHOCARDIOGRAM REPORT   Patient Name:   STORMEE DUDA Date of Exam: 01/18/2024 Medical Rec #:  [id number]               Height:       61.0 in Accession #:    [id number]              Weight:       133.4 lb Date of Birth:  June 20, 1956                BSA:          1.590 m Patient Age:    68 years                BP:           142/72 mmHg Patient Gender: F                       HR:           74 bpm. Exam Location:  [facility] Procedure: 2D Echo, Cardiac Doppler, Color Doppler and Strain Analysis (Both             Spectral and Color Flow Doppler were utilized during  procedure). Indications:     Syncope R55  History:         Patient has prior history of Echocardiogram examinations, most                  recent 07/06/2023. Risk Factors:Diabetes and Hypertension.                  Aortic stenosis, S/P TAVR, End stage renal disease.  Sonographer:     Christopher Furnace Referring Phys:  [id number] MURVIN MANA Diagnosing Phys: Deatrice Cage MD  Sonographer Comments: Global longitudinal strain was attempted. IMPRESSIONS  1. Left ventricular ejection fraction, by estimation, is 60 to 65%. The left ventricle has normal function. The left ventricle has no regional wall motion abnormalities. There is moderate left ventricular hypertrophy. Left ventricular diastolic parameters are consistent with Grade II diastolic dysfunction (pseudonormalization).  2. Right ventricular systolic function is normal. The right ventricular size is normal. Tricuspid regurgitation signal is inadequate for assessing PA pressure.  3. The mitral valve is normal in structure. Mild mitral valve regurgitation. No evidence of mitral stenosis. Moderate mitral annular calcification.  4. The aortic valve has been repaired/replaced. Aortic valve regurgitation is not visualized. No aortic stenosis is present. Echo findings are consistent with normal structure and function of the aortic valve prosthesis. Aortic valve mean gradient measures 10.3 mmHg. FINDINGS  Left Ventricle: Left ventricular ejection fraction, by estimation, is 60 to 65%. The left ventricle has normal function. The left ventricle has no regional wall motion abnormalities. Global longitudinal strain performed but not reported based on interpreter judgement due to suboptimal tracking. 3D ejection fraction reviewed and evaluated as part of the interpretation. Alternate measurement of EF is felt to be most reflective of LV function. The left ventricular internal cavity size was normal in  size. There is moderate  left ventricular hypertrophy. Left ventricular diastolic parameters are consistent with Grade II diastolic dysfunction (pseudonormalization). Right Ventricle: The right ventricular size is normal. No increase in right ventricular wall thickness. Right ventricular systolic function is normal. Tricuspid regurgitation signal is inadequate for assessing PA pressure. Left Atrium: Left atrial size was normal in size. Right Atrium: Right atrial size was normal in size. Pericardium: There is no evidence of pericardial effusion. Mitral Valve: The mitral valve is normal in structure. Moderate mitral annular calcification. Mild mitral valve regurgitation. No evidence of mitral valve stenosis. MV peak gradient, 8.9 mmHg. The mean mitral valve gradient is 5.0 mmHg. Tricuspid Valve: The tricuspid valve is normal in structure. Tricuspid valve regurgitation is not demonstrated. No evidence of tricuspid stenosis. Aortic Valve: The aortic valve has been repaired/replaced. Aortic valve regurgitation is not visualized. No aortic stenosis is present. Aortic valve mean gradient measures 10.3 mmHg. Aortic valve peak gradient measures 18.8 mmHg. Aortic valve area, by VTI measures 1.60 cm. Echo findings are consistent with normal structure and function of the aortic valve prosthesis. Pulmonic Valve: The pulmonic valve was normal in structure. Pulmonic valve regurgitation is not visualized. No evidence of pulmonic stenosis. Aorta: The aortic root is normal in size and structure. Venous: The inferior vena cava was not well visualized. IAS/Shunts: No atrial level shunt detected by color flow Doppler.  LEFT VENTRICLE PLAX 2D LVIDd:         3.90 cm   Diastology LVIDs:         2.80 cm   LV e' medial:    6.96 cm/s LV PW:         0.90 cm  LV E/e' medial:  17.4 LV IVS:        1.90 cm   LV e' lateral:   5.55 cm/s LVOT diam:     2.00 cm   LV E/e' lateral: 21.8 LV SV:         75 LV SV Index:   47 LVOT Area:     3.14 cm  RIGHT VENTRICLE RV Basal  diam:  3.30 cm RV Mid diam:    3.20 cm RV S prime:     11.60 cm/s TAPSE (M-mode): 2.0 cm LEFT ATRIUM             Index        RIGHT ATRIUM           Index LA diam:        3.50 cm 2.20 cm/m   RA Area:     10.70 cm LA Vol (A2C):   31.5 ml 19.81 ml/m  RA Volume:   21.40 ml  13.46 ml/m LA Vol (A4C):   44.6 ml 28.05 ml/m LA Biplane Vol: 37.5 ml 23.58 ml/m  AORTIC VALVE AV Area (Vmax):    1.35 cm AV Area (Vmean):   1.41 cm AV Area (VTI):     1.60 cm AV Vmax:           217.00 cm/s AV Vmean:          148.000 cm/s AV VTI:            0.473 m AV Peak Grad:      18.8 mmHg AV Mean Grad:      10.3 mmHg LVOT Vmax:         93.40 cm/s LVOT Vmean:        66.500 cm/s LVOT VTI:          0.240 m LVOT/AV VTI ratio: 0.51  AORTA Ao Root diam: 2.60 cm MITRAL VALVE                TRICUSPID VALVE MV Area (PHT): 3.87 cm     TR Peak grad:   17.5 mmHg MV Area VTI:   2.07 cm     TR Vmax:        209.00 cm/s MV Peak grad:  8.9 mmHg MV Mean grad:  5.0 mmHg     SHUNTS MV Vmax:       1.49 m/s     Systemic VTI:  0.24 m MV Vmean:      104.0 cm/s   Systemic Diam: 2.00 cm MV Decel Time: 196 msec MV E velocity: 121.00 cm/s MV A velocity: 117.00 cm/s MV E/A ratio:  1.03 Deatrice Cage MD Electronically signed by Deatrice Cage MD Signature Date/Time: 01/18/2024/2:31:12 PM    Final    CT ABDOMEN PELVIS W CONTRAST Result Date: 01/14/2024 CLINICAL DATA:  Abdomen pain EXAM: CT ABDOMEN AND PELVIS WITH CONTRAST TECHNIQUE: Multidetector CT imaging of the abdomen and pelvis was performed using the standard protocol following bolus administration of intravenous contrast. RADIATION DOSE REDUCTION: This exam was performed according to the departmental dose-optimization program which includes automated exposure control, adjustment of the mA and/or kV according to patient size and/or use of iterative reconstruction technique. CONTRAST:  75mL OMNIPAQUE  IOHEXOL  350 MG/ML SOLN COMPARISON:  CT 11/25/2022, 07/06/2023 FINDINGS: Lower chest: Lung bases demonstrate  no acute airspace disease. Cardiomegaly. Distal esophageal and GE junction thickening. Hepatobiliary: Distended gallbladder. Multiple stones at the gallbladder neck. Diffuse gallbladder wall thickening and or pericholecystic fluid. No biliary dilatation. Atrophic appearing  left hepatic lobe as before. Pancreas: Unremarkable. No pancreatic ductal dilatation or surrounding inflammatory changes. Spleen: Normal in size without focal abnormality. Adrenals/Urinary Tract: Adrenal glands are within normal limits. Kidneys show no hydronephrosis. Cyst in the right kidney, no imaging follow-up is recommended. Delayed excretion of contrast. Contrast or hyperdense material in the urinary bladder. Stomach/Bowel: Stomach is decompressed but there may be diffuse gastric wall thickening. Focal lobulated hyperenhancing area at the gastric antrum measuring 15 by 14 by 16 mm on series 2, image 29 and coronal series 6, image 16. There is no dilated small bowel. Negative appendix. No acute bowel wall thickening Vascular/Lymphatic: Advanced aortic atherosclerosis. No aneurysm. No suspicious lymph nodes Reproductive: No suspicious adnexal mass.  Probable uterine fibroids Other: Negative for pelvic effusion or free air Musculoskeletal: No acute or suspicious osseous abnormality. Multilevel Schmorl's nodes IMPRESSION: 1. Distended gallbladder with multiple stones at the gallbladder neck. Diffuse gallbladder wall thickening and or pericholecystic fluid. Imaging appearance is suspicious for an acute cholecystitis. Correlation with ultrasound is suggested. 2. Stomach is decompressed but there may be diffuse gastric wall thickening, question gastritis. Distal esophageal and GE junction thickening as well. Focal lobulated hyperenhancing area at the gastric antrum measuring 15 x 14 x 16 mm, possible enhancing mucosal lesion/mass. Recommend further evaluation with endoscopy. 3. Delayed excretion of contrast from the kidneys, correlate with  appropriate lab values. There appears to be excreted contrast though within the urinary bladder (? Has the patient had recent contrast administration prior to today ) Aortic Atherosclerosis (ICD10-I70.0). Electronically Signed   By: Luke Bun M.D.   On: 01/14/2024 21:52   CT Angio Chest PE W and/or Wo Contrast Result Date: 01/14/2024 CLINICAL DATA:  Not feeling well syncope EXAM: CT ANGIOGRAPHY CHEST WITH CONTRAST TECHNIQUE: Multidetector CT imaging of the chest was performed using the standard protocol during bolus administration of intravenous contrast. Multiplanar CT image reconstructions and MIPs were obtained to evaluate the vascular anatomy. RADIATION DOSE REDUCTION: This exam was performed according to the departmental dose-optimization program which includes automated exposure control, adjustment of the mA and/or kV according to patient size and/or use of iterative reconstruction technique. CONTRAST:  75mL OMNIPAQUE  IOHEXOL  350 MG/ML SOLN COMPARISON:  CT angiography 11/25/2022 FINDINGS: Cardiovascular: Satisfactory opacification of the pulmonary arteries to the segmental level. No evidence of pulmonary embolism. Moderate aortic atherosclerosis. No aneurysm or dissection. Common origin of the right brachiocephalic and left common carotid arteries. 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 Suspect stenosis at the origin of the left vertebral artery. Status post TAVR. Coronary vascular calcification. Cardiomegaly. No pericardial effusion Mediastinum/Nodes: Patent trachea. Multiple thyroid  nodules measuring up to 1.5 cm. No suspicious lymph nodes. Esophagus shows hernia and prominent distal esophageal thickening and thickening at the GE junction. Lungs/Pleura: No acute airspace disease, pleural effusion or pneumothorax. Multiple small pulmonary nodules, largest is seen at the left apical lung measuring 5 mm series 5,  image 25, previously 5 mm. Upper Abdomen: See separately dictated CT Musculoskeletal: No acute osseous abnormality Review of the MIP images confirms the above findings. IMPRESSION: 1. Negative for acute pulmonary embolus or aortic dissection. 2. Cardiomegaly with interval TAVR. 3. 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 4. Multiple small pulmonary nodules measuring up to 5 mm, stable. No specific imaging follow-up is recommended. 5. Small distal esophageal hernia with prominent distal esophageal thickening and thickening at the GE junction.  Endoscopy follow-up if deemed appropriate. 6. Several small thyroid  nodules measuring up to 1.5 cm. Recommend thyroid  US  (ref: J Am Coll Radiol. 2015 Feb;12(2): 143-50).This should be performed non emergently 7. Aortic atherosclerosis. Aortic Atherosclerosis (ICD10-I70.0). Electronically Signed   By: Luke Bun M.D.   On: 01/14/2024 21:39   CT Head Wo Contrast Result Date: 01/14/2024 CLINICAL DATA:  Mental status change, unknown cause EXAM: CT HEAD WITHOUT CONTRAST TECHNIQUE: Contiguous axial images were obtained from the base of the skull through the vertex without intravenous contrast. RADIATION DOSE REDUCTION: This exam was performed according to the departmental dose-optimization program which includes automated exposure control, adjustment of the mA and/or kV according to patient size and/or use of iterative reconstruction technique. COMPARISON:  None Available. FINDINGS: Brain: Normal anatomic configuration. No abnormal intra or extra-axial mass lesion or fluid collection. No abnormal mass effect or midline shift. No evidence of acute intracranial hemorrhage or infarct. Ventricular size is normal. Cerebellum unremarkable. Vascular: Unremarkable Skull: Intact Sinuses/Orbits: Paranasal sinuses are clear. Orbits are unremarkable. Other: Mastoid air cells and middle ear cavities are clear. IMPRESSION: 1.  Normal CT examination of the brain. Electronically Signed   By: Dorethia Molt M.D.   On: 01/14/2024 19:16   DG Chest Port 1 View Result Date: 01/14/2024 CLINICAL DATA:  Dizziness, chest pain, syncopal episode. EXAM: PORTABLE CHEST 1 VIEW COMPARISON:  Portable chest 07/08/2023. FINDINGS: The heart is moderately enlarged. Interval TAVR. Stable mediastinum. The aorta is tortuous and calcified. There is central vascular prominence and mild flow cephalization but there is no overt edema. The lungs are clear of infiltrates. No pleural effusion is seen. A left axillary vascular stent is again noted. Advanced thoracic spondylosis. IMPRESSION: 1. Cardiomegaly with central vascular prominence and mild flow cephalization but no overt edema. 2. Interval TAVR. 3. Aortic atherosclerosis. Electronically Signed   By: Francis Quam M.D.   On: 01/14/2024 04:48    Microbiology: Results for orders placed or performed during the hospital encounter of 01/14/24  Urine Culture     Status: Abnormal   Collection Time: 01/14/24  9:53 PM   Specimen: Urine, Random  Result Value Ref Range Status   Specimen Description   Final    URINE, RANDOM Performed at [facility], [address], [place], [place] [address]    Special Requests   Final    NONE Reflexed from [id number] Performed at [facility], [address] [place], [place] [address]    Culture MULTIPLE SPECIES PRESENT, SUGGEST RECOLLECTION (A)  Final   Report Status 01/16/2024 FINAL  Final  Blood Culture (routine x 2)     Status: None (Preliminary result)   Collection Time: 01/14/24 11:32 PM   Specimen: BLOOD  Result Value Ref Range Status   Specimen Description BLOOD BLOOD RIGHT HAND  Final   Special Requests   Final    BOTTLES DRAWN AEROBIC AND ANAEROBIC Blood Culture adequate volume   Culture   Final    NO GROWTH 4 DAYS Performed at [facility], [address] [place], [place] [address]    Report Status PENDING  Incomplete   Blood Culture (routine x 2)     Status: None (Preliminary result)   Collection Time: 01/14/24 11:32 PM   Specimen: BLOOD  Result Value Ref Range Status   Specimen Description BLOOD BLOOD RIGHT HAND  Final   Special Requests   Final    BOTTLES DRAWN AEROBIC AND ANAEROBIC Blood Culture results may not be optimal due to  an inadequate volume of blood received in culture bottles   Culture   Final    NO GROWTH 4 DAYS Performed at [facility], [address] [place], [place] [address]    Report Status PENDING  Incomplete    Labs: CBC: Recent Labs  Lab 01/14/24 0426 01/15/24 0203 01/16/24 0623 01/17/24 0450 01/18/24 0620  WBC 10.2 13.2* 8.4 6.8 5.0  NEUTROABS 8.1*  --   --   --   --   HGB 10.7* 10.6* 9.2* 9.0* 9.6*  HCT 36.3 34.8* 30.9* 30.8* 31.4*  MCV 98.4 95.9 97.2 98.4 95.4  PLT 180 170 158 181 174   Basic Metabolic Panel: Recent Labs  Lab 01/14/24 0426 01/15/24 0203 01/16/24 0623 01/17/24 0945  NA 142 135 137 138  K 5.2* 4.0 4.1 4.0  CL 102 94* 101 100  CO2 29 27 25  21*  GLUCOSE 159* 204* 95 173*  BUN 45* 31* 46* 54*  CREATININE 7.21* 5.29* 7.43* 8.95*  CALCIUM  9.4 8.8* 8.6* 8.6*  PHOS  --   --   --  7.0*   Liver Function Tests: Recent Labs  Lab 01/14/24 0426 01/17/24 0945  AST 35  --   ALT 12  --   ALKPHOS 114  --   BILITOT 1.0  --   PROT 6.1*  --   ALBUMIN 3.2* 2.5*   CBG: Recent Labs  Lab 01/17/24 1445 01/17/24 1716 01/17/24 2126 01/18/24 0717 01/18/24 1113  GLUCAP 91 144* 149* 115* 154*    Discharge time spent: greater than 30 minutes.  Signed: Murvin Mana, MD [facility] 01/18/2024

## 2024-01-18 NOTE — Plan of Care (Signed)
   Problem: Education: Goal: Knowledge of General Education information will improve Description Including pain rating scale, medication(s)/side effects and non-pharmacologic comfort measures Outcome: Progressing

## 2024-01-19 LAB — CULTURE, BLOOD (ROUTINE X 2)
Culture: NO GROWTH
Culture: NO GROWTH
Special Requests: ADEQUATE

## 2024-02-18 NOTE — Progress Notes (Signed)
 [facility] Gastroenterology Initial Consultation Visit    Reason for Visit: gastric polyp Referring Provider: Rolland Mardeen Moats  Primary Care Provider: Gwenyth Elida Macintosh, MD Reason for visit: New consultation requested by Rolland Mardeen Moats, MD for evaluation of Gastric polyp [K31.7]  Assessment/Plan:  Christy Curry is a 68 y.o. female with a PMHx of ESRD on iHD, HFrEF, CAD s/p PCI (07/2023) on eliquis  and clopidogrel , AS s/p TAVR (07/2023), HTN, T2DM, and hyperplastic gastric polyp who presents for evaluation of gastric polyp.  CT AP (06/2023) demonstrated a 1.5 cm hyperattenuating polypoid lesion in the gastric antrum wall (re-demonstrated on subsequent CT imaging since this time). Given size of lesion, endoscopic evaluation is recommended.  Of note, patient has a history of a hyperplastic gastric polyp (6mm) resected via hot snare on EGD 08/2019 (pathology negative for metaplasia or dysplasia). She also has a history of colon polyps (2 tubular adenomas) which were resected on colonoscopy from 08/2019.   At this time, recommend non-urgent EGD/colo for assessment of gastric polyp and colon cancer screening. Patient will likely need to be off of anticoagulation/anti-platelet therapy for several days prior to EGD due to bleeding risk associated with resection of gastric polyps. We will reach out to patient's cardiologist to determine appropriate timing for procedure and AC/AP interruption.   Gastric polyp H/o colon polyps - Plan for non-urgent EGD/colonoscopy when appropriate from cardiovascular standpoint (patient will need to be off eliquis /plavix  for several days due to bleeding risk_ - We will reach out to patient's cardiologist (Dr. Vavalle) regarding anti-coagulation/anti-platelet therapy and optimal timing of procedure  No follow-ups on file.  Subjective History of Present Illness  Accompanied by: N/A (unaccompanied)  68 y.o. female with ESRD on iHD, HFrEF, CAD s/p  PCI (07/2023) on eliquis  and clopidogrel , AS s/p TAVR (07/2023), HTN, and T2DM who presents for evaluation of gastric polyp.  Patient was hospitalized 2/17 - 08/24/23 for symptomatic CAD and severe aortic stenosis. She underwent RCA PCI 2/17 and TAVR 2/27. GI was consulted for a 1.5cm polypoid lesion in the stomach seen on CT AP from January 2024. Given the size of the lesion, endoscopic assessment was recommended, however, this was deferred given her multiple co-morbidities and need for uninterrupted anti-coagulation/anti-platelet therapy.   Of note, patient has a known history of hyperplastic gastric polyp. She underwent EGD/colo 08/2019 at [facility]. EGD demonstrated a 6 mm sessile polyp in the gastric antrum, removed with a hot snare. Colonoscopy showed 2 polyps (tubular adenomas) which were resected. She was recommended to undergo repeat colonoscopy in 2024 for surveillance. This has not yet been performed.  She is currently on apixaban  5mg  BID and plavix  75mg  daily.   Objective Physical Exam  Vital Signs: BP 197/77 (BP Site: R Arm, BP Position: Sitting, BP Cuff Size: Medium)   Pulse 75   Ht 154.9 cm (5' 1)   Wt 54.7 kg (120 lb 11.2 oz)   BMI 22.81 kg/m  Constitutional: She is in no apparent distress Eyes: Anicteric sclerae Cardiovascular: No peripheral edema Gastrointestinal: Soft, nontender abdomen without hepatosplenomegaly, hernias, or masses Neurologic: Awake, alert, and oriented to person, place, and time with normal speech  CT AP 12/06/23 Findings: There is an indeterminate 1.7 x 1.5 cm polypoid intraluminal mass arising from the gastric antrum (2:66). Diffuse mural thickening of the distal esophagus and stomach. No findings of bowel obstruction or acute inflammation.  Normal appendix (2:111).   Impression:  Diffuse mural thickening of the distal esophagus and stomach may reflect gastroesophagitis,  possibly secondary to gastroesophageal reflux disease.Similar intraluminal polypoid mass  arising the gastric antrum, indeterminate whether this is a benign or malignant lesion. Recommend Gastroenterology consult for upper GI endoscopic evaluation.  EGD 08/2019:     Colonoscopy 08/2019   Path 08/2019: DIAGNOSIS   A.  Stomach, endoscopic biopsy:   Gastric oxyntic mucosa with mild chronic inactive gastritis. Gastric antral mucosa with reactive gastropathy.  Negative for evidence of Helicobacter pylori on routine H&E stain.   B.  Gastric polyps, endoscopic polypectomy:   Hyperplastic polyp. Negative for intestinal metaplasia and dysplasia.    C.  Ascending colon polyps, endoscopic polypectomy:   Tubular adenoma, five fragments.    D.  Colon polyps, endoscopic polypectomy:   Tubular adenoma, three fragments.     I reviewed external notes from the following unique sources: [place], [place]. These are summarized within the history of present illness.

## 2024-02-18 NOTE — Progress Notes (Signed)
I saw and evaluated the patient, participating in the key portions of the service.  I reviewed the resident???s note.  I agree with the resident???s findings and plan. Liane Comber, MD

## 2024-02-22 ENCOUNTER — Other Ambulatory Visit

## 2024-02-28 NOTE — Progress Notes (Unsigned)
 Cardiology Office Note  Date:  02/29/2024   ID:  Christy Curry, DOB 1955/12/04, MRN [id number]  PCP:  Gwenyth Barnacle, MD   Chief Complaint  Patient presents with   [facility] follow up     Patient was at [facility] with syncope; no further spells of syncope. Patient c/o chest tightness and shortness of breath.     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  Reports she is slowly getting her strength back Lives by herself, Daughter and son help with ADLs Trying to get license back, family reluctant  Denies significant shortness of breath or chest pain No significant leg swelling Labile blood pressure Reports taking her medications this morning, blood pressure in the office ranging from 150 up to 180 systolic Negative orthostatics  loss of her daughter 2 year ago,  loss of spouse 1 year ago  EKG performed May 26, 2023 EKG Interpretation Date/Time:  Tuesday February 29 2024 12:10:58 EDT Ventricular Rate:  78 PR Interval:  142 QRS Duration:  120 QT Interval:  430 QTC Calculation: 490 R Axis:   2  Text Interpretation: Normal sinus rhythm  Possible Left atrial enlargement RSR' or QR pattern in V1 suggests right ventricular conduction delay Nonspecific ST abnormality When compared with ECG of 14-Jan-2024 04:05, No significant change was found Confirmed by Perla Lye [phone]) on 02/29/2024 12:24:08 PM   Other past medical history reviewed Admitted to the hospital in June 2024 with abdominal pain, BP elevated 200 systolic  Troponin negative CT scan chest showing moderate-sized hiatal hernia, gallstones 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 Coronary artery calcification, mild aortic atherosclerosis Home   PMH:   has a past medical history of Anemia of chronic disease, Aortic stenosis, Diabetes mellitus with complication (HCC), Diabetic angiopathy (HCC), Diabetic retinopathy (HCC), ESRD (end stage renal disease) (HCC), HLD (hyperlipidemia), Hypertension, and RBBB.  PSH:    Past Surgical History:  Procedure Laterality Date   av fistula Left     Current Outpatient Medications  Medication Sig Dispense Refill   acetaminophen  (TYLENOL ) 325 MG tablet Take 2 tablets (650 mg total) by mouth every 6 (six) hours as needed for fever or headache.     amLODipine  (NORVASC ) 2.5 MG tablet Take 2.5 mg by mouth daily.     aspirin  EC 81 MG tablet Take 1 tablet (81 mg total) by mouth daily. Swallow whole.     atorvastatin  (LIPITOR ) 80 MG tablet Take 1 tablet (80 mg total) by mouth at bedtime.     carvedilol  (COREG ) 25 MG tablet Take 25 mg by mouth 2 (two) times daily with a meal.     Chlorhexidine  Gluconate Cloth 2 % PADS Apply 6 each topically daily at 6 (  six) AM.     cloNIDine  (CATAPRES ) 0.2 MG tablet Take 0.5 tablets (0.1 mg total) by mouth 2 (two) times daily. 30 tablet 0   clopidogrel  (PLAVIX ) 75 MG tablet Take 75 mg by mouth daily.     ELIQUIS  5 MG TABS tablet Take 5 mg by mouth 2 (two) times daily.     ezetimibe  (ZETIA ) 10 MG tablet Take 10 mg by mouth daily.     famotidine (PEPCID) 20 MG  tablet Take 20 mg by mouth daily as needed.     furosemide  (LASIX ) 80 MG tablet Take 80 mg by mouth daily.     losartan  (COZAAR ) 25 MG tablet Take 1 tablet by mouth daily.     menthol -cetylpyridinium (CEPACOL) 3 MG lozenge Take 1 lozenge (3 mg total) by mouth as needed for sore throat.     methocarbamol (ROBAXIN) 500 MG tablet Take 500 mg by mouth 2 (two) times daily.     Multiple Vitamin (MULTI-VITAMIN) tablet Take 1 tablet by mouth daily.     multivitamin (RENA-VIT) TABS tablet Take 1 tablet by mouth at bedtime.     Nutritional Supplements (FEEDING SUPPLEMENT, NEPRO CARB STEADY,) LIQD Take 237 mLs by mouth 2 (two) times daily.     ondansetron  (ZOFRAN ) 4 MG/2ML SOLN injection Inject 2 mLs (4 mg total) into the vein every 6 (six) hours as needed for nausea or vomiting.     pantoprazole  (PROTONIX ) 40 MG tablet Take 1 tablet (40 mg total) by mouth daily. 30 tablet 0   sevelamer  carbonate (RENVELA ) 0.8 g PACK packet Take 0.8 g by mouth 3 (three) times daily with meals.     ipratropium-albuterol  (DUONEB) 0.5-2.5 (3) MG/3ML SOLN Take 3 mLs by nebulization every 6 (six) hours as needed. (Patient not taking: Reported on 02/29/2024)     ipratropium-albuterol  (DUONEB) 0.5-2.5 (3) MG/3ML SOLN Take 3 mLs by nebulization 4 (four) times daily. (Patient not taking: Reported on 02/29/2024)     polyethylene glycol (MIRALAX  / GLYCOLAX ) 17 g packet Take 17 g by mouth daily as needed for moderate constipation. (Patient not taking: Reported on 02/29/2024)     No current facility-administered medications for this visit.    Allergies:   2,4-d dimethylamine; Other; Tetanus-diphtheria toxoids td; Diphtheria toxoid-containing vaccines; Diphtheria-tetanus toxoids dt; Tetanus toxoid; Tetanus toxoid-containing vaccines; and Tetanus antitoxin   Social History:  The patient  reports that she has never smoked. She has never used smokeless tobacco. She reports that she does not drink alcohol and does not use drugs.   Family  History:   family history includes Diabetes in her father; Kidney disease in her mother; Peripheral Artery Disease (age of onset: 1) in her daughter.    Review of Systems: Review of Systems  Constitutional: Negative.   HENT: Negative.    Respiratory: Negative.    Cardiovascular: Negative.   Gastrointestinal: Negative.   Musculoskeletal: Negative.   Neurological: Negative.   Psychiatric/Behavioral: Negative.    All other systems reviewed and are negative.   PHYSICAL EXAM: VS:  BP (!) 158/68 (BP Location: Left Arm, Patient Position: Sitting, Cuff Size: Normal)   Pulse 78   Ht 5' 1 (1.549 m)   Wt 121 lb 8 oz (55.1 kg)   LMP 06/22/2016   SpO2 99%   BMI 22.96 kg/m  , BMI Body mass index is 22.96 kg/m. Constitutional:  oriented to person, place, and time. No distress.  HENT:  Head: Normocephalic and atraumatic.  Eyes:  no discharge. No scleral icterus.  Neck:  Normal range of motion. Neck supple. No JVD present.  Cardiovascular: Normal rate, regular rhythm, normal heart sounds and intact distal pulses. Exam reveals no gallop and no friction rub. No edema 2/6 SEM RSB Pulmonary/Chest: Effort normal and breath sounds normal. No stridor. No respiratory distress.  no wheezes.  no rales.  no tenderness.  Abdominal: Soft.  no distension.  no tenderness.  Musculoskeletal: Normal range of motion.  no  tenderness or deformity.  Neurological:  normal muscle tone. Coordination normal. No atrophy Skin: Skin is warm and dry. No rash noted. not diaphoretic.  Psychiatric:  normal mood and affect. behavior is normal. Thought content normal.   Recent Labs: 07/05/2023: TSH 2.373 07/12/2023: Magnesium 2.4 01/14/2024: ALT 12; B Natriuretic Peptide 1,533.1 01/17/2024: BUN 54; Creatinine, Ser 8.95; Potassium 4.0; Sodium 138 01/18/2024: Hemoglobin 9.6; Platelets 174    Lipid Panel Lab Results  Component Value Date   TRIG 91 07/07/2023      Wt Readings from Last 3 Encounters:  02/29/24 121 lb 8  oz (55.1 kg)  01/18/24 133 lb 6.1 oz (60.5 kg)  07/12/23 135 lb 5.8 oz (61.4 kg)    ASSESSMENT AND PLAN:  Problem List Items Addressed This Visit       Cardiology Problems   Essential hypertension   Relevant Medications   amLODipine  (NORVASC ) 2.5 MG tablet   furosemide  (LASIX ) 80 MG tablet   Other Relevant Orders   EKG 12-Lead (Completed)   Coronary artery calcification - Primary   Relevant Medications   amLODipine  (NORVASC ) 2.5 MG tablet   furosemide  (LASIX ) 80 MG tablet   Other Relevant Orders   EKG 12-Lead (Completed)   Atherosclerotic peripheral vascular disease with ulceration (HCC)   Relevant Medications   amLODipine  (NORVASC ) 2.5 MG tablet   furosemide  (LASIX ) 80 MG tablet   Other Relevant Orders   EKG 12-Lead (Completed)     Other   Diabetes mellitus type 2, noninsulin dependent (HCC)   ESRD on dialysis (HCC)   Anemia due to chronic kidney disease   Other Visit Diagnoses       Aortic valve stenosis, etiology of cardiac valve disease unspecified       Relevant Medications   amLODipine  (NORVASC ) 2.5 MG tablet   furosemide  (LASIX ) 80 MG tablet   Other Relevant Orders   EKG 12-Lead (Completed)       Coronary disease with stable angina Multivessel coronary disease catheterization August 10, 2023 [facility] -Stent placed RCA February 2025 [facility] -On Plavix , Eliquis ,  off aspirin  -Currently with no symptoms of angina. No further workup at this time. Continue current medication regimen.  Aortic atherosclerosis/penetrating ulcer Plaque noted on CT scan Stressed the importance of aggressive cholesterol management Continue Lipitor  80 daily  End-stage renal disease on hemodialysis Monday Wednesday Friday On Lasix  80 daily  Essential hypertension Blood pressure elevated, reports she has taken her morning medications  Diabetes type 2 Managed by primary care  Aortic valve stenosis, status post TAVR Followed at [facility] Recent echocardiogram reviewed, mean gradient 10  mmHg.  No notable perivalvular leak  Signed, Velinda Lunger, M.D., Ph.D. [facility] [place], [place] [phone]

## 2024-02-29 ENCOUNTER — Ambulatory Visit

## 2024-02-29 ENCOUNTER — Ambulatory Visit: Attending: Cardiovascular Disease | Admitting: Cardiovascular Disease

## 2024-02-29 VITALS — BP 158/68 | HR 78 | Ht 61.0 in | Wt 121.5 lb

## 2024-02-29 DIAGNOSIS — I10 Essential (primary) hypertension: Secondary | ICD-10-CM

## 2024-02-29 DIAGNOSIS — E119 Type 2 diabetes mellitus without complications: Secondary | ICD-10-CM

## 2024-02-29 DIAGNOSIS — I350 Nonrheumatic aortic (valve) stenosis: Secondary | ICD-10-CM | POA: Diagnosis not present

## 2024-02-29 DIAGNOSIS — I2510 Atherosclerotic heart disease of native coronary artery without angina pectoris: Secondary | ICD-10-CM | POA: Diagnosis not present

## 2024-02-29 DIAGNOSIS — Z992 Dependence on renal dialysis: Secondary | ICD-10-CM

## 2024-02-29 DIAGNOSIS — N189 Chronic kidney disease, unspecified: Secondary | ICD-10-CM

## 2024-02-29 DIAGNOSIS — D631 Anemia in chronic kidney disease: Secondary | ICD-10-CM

## 2024-02-29 DIAGNOSIS — I7025 Atherosclerosis of native arteries of other extremities with ulceration: Secondary | ICD-10-CM | POA: Diagnosis not present

## 2024-02-29 DIAGNOSIS — N186 End stage renal disease: Secondary | ICD-10-CM

## 2024-02-29 MED ORDER — CLONIDINE HCL 0.1 MG PO TABS: 0.1000 mg | ORAL_TABLET | Freq: Two times a day (BID) | ORAL | 3 refills | Status: AC

## 2024-02-29 NOTE — Patient Instructions (Signed)

## 2024-04-06 NOTE — Progress Notes (Signed)
 I spent a total of 60 minutes in both face-to-face and non-face-to-face activities for this visit on the date of this encounter.   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 Pseudophakia OU PRP OD #1 06/04/20 PRP OS #1 07/11/20 Fill-in PRP OD#2 08/08/20 s/p PPV/MP/EL/posterior capsulotomy/PSTK/Avastin OD (10/15/20 Chen/Justin)  s/p PPV/MP/EL OS (09/13/2020, Deaner/Chen) s/p 25gPPV/EL/Avastin OS (12/26/20 Chen/Feng) s/p 25g PPV/EL/avastin OS 03/16/22 (Hsu/Chen)   04/06/2024: initial low vision rehab evaluation/driving evaluation Unfortunately patient's visual field test today does NOT meet [place] DMV vision requirements for driving, pt must discontinue driving and use alternative transportation options Updated NVO glasses Rx provided Referral placed to [facility] for OT home visit RTC vision rehab prn  Resources explained and information provided to patient and visit companion(s). Disease progression and prognosis for function discussed. Questions from patient and companion(s) addressed.  Recommended Devices: Apps for visual impairment  Windows accessibility  Lighting Filters  Non-optical devices: writing aids/signature guides, large print items, and tactile markers  Recommending Training:  computer accessibility , device usage , writing aids/guides , medication management , cooking aids , proper lighting , glare control , environmental modification, and fall prevention techniques   Refer To:  OT for: evaluation and training to meet visual goals  and Refer to OT for home visit and evaluation  Return to low vision rehabilitation clinic: as needed Continue care with Ophthalmologist (Dr. Laurence) as directed  The Chief Complaint, HPI, ROS and PFSHx as documented were reviewed and I agree as documented, or revised as indicated.    [name], OD, FAAO Assistant Professor of Ophthalmology Division of Vision  Rehabilitation & Performance

## 2024-04-06 NOTE — Progress Notes (Signed)
 [facility]:  Occupational Therapy Evaluation & Treatment Note  The following treatment plan is directly associated with the referring physician's (OD/MD) evaluation and treatment plan, and the OT treatment plan is based on the referral request. This client's medical record and optometric reports have been reviewed prior to development of the OT POC, treatment and intervention process.   Date of OT orders:   04/06/2024 Date of OT service:  04/06/2024 Start Time: 3:19 End Time:   4:34                      (Total Time:   75 minutes) Procedure(s):          - 02834 - OT Evaluation, Low Complexity (30 minutes)        - 97535 - Self-care X  3 Units (38- 52 minutes):   45 minutes  Referring Provider: VRP: Madison Dunning, OD  Reason for Referral:  - See referring provider POC/note  - Low Vision Evaluation   - Lighting & Filters - Address ADL/IADL concerns secondary to vision impairment  - Assistive & Access Technology Evaluation & Training  DIAGNOSES Primary Diagnoses/Impairment:  Low vision right eye category 1, low vision left eye category 1  (primary encounter diagnosis)  Visual field constriction, bilateral  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 Type 2 diabetes mellitus, without long-term current use of insulin  (CMS/HHS-HCC)  . Back pain  . Achilles tendon pain  . Knee pain  . Ankle pain  . Falls  . Presence of intraocular lens  . Disorder of refraction  . Diabetes mellitus due  to underlying condition with left eye affected by proliferative retinopathy without macular edema, with long-term current use of insulin  (CMS/HHS-HCC)  . ESRD on hemodialysis (CMS/HHS-HCC)  . Mild OSA (Overall AHI 9/hr)  . Microscopic hematuria  . Family history of bladder cancer  . Volume overload  . Essential hypertension  . End stage renal disease (CMS/HHS-HCC)  . History of 2019 novel coronavirus disease (COVID-19)  . Limitation of joint motion of hand, left  . Ingrowing toenail of right foot  . Pain in toe of right foot  . Hammer toes of both feet  . Head trauma  . Vitreous hemorrhage of left eye (CMS-HCC)  . PAD (peripheral artery disease)  . Gastroesophageal reflux disease without esophagitis  . Diabetic neuropathy (CMS/HHS-HCC)  . Osteoarthritis of multiple joints  . Anemia due to chronic kidney disease  . Aortic stenosis  . Coronary artery calcification  . COVID-19  . Esophageal thickening  . Hyperlipidemia  . Multiple thyroid  nodules  . Pulmonary nodules  . Stage 5 chronic kidney disease on chronic dialysis (CMS/HHS-HCC)   OCCUPATIONAL PROFILE Client is a 68 y.o. female who reports increased difficulty with occupational performance secondary to visual impairment. Prefers to go by [employer].  This client is joined today by friend and driver, Lavada Mini.  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  Leisure Activities: - Reading - Challenges in most participation Home Environment: This client lives in a 1 1/2 Story Single Family Home [facility])  with 1 step without   rail(s) to enter.  Devices / Diplomatic Services Operational Officer: Phone: Android  Tablet: None / NA Computer: Emergency Planning/management Officer: Sports Coach: uses magnifier on phone OTC Readers: NA / None currently uses PAL Rx that has a deep frame and has to lift up her glasses to see very small print Sunglasses:  NA  Current Level of Function ADLs: Modified Independent for most basic ADLs, requires assistance with heavy cleaning, shopping                Instrumental ADLs: Modified Independent for most IADLs (reading, writing, home tasks, medication management, financial management, computer use, phone use), requires assistance with driving, shopping Driving & Community Mobility: Does not drive or stopped driving and stopped driving in 7980; she was interested in resuming driving but does not meet DMV requirements due to VF limitations OU. Rest / Sleep: Reports no difficulty or trouble sleeping.  Leisure: Unable or no longer able to participate in valued leisure activities.  Social Participation: Reports mild challenges in engaging in activities that result in successful interaction at the community level (recognizing people, seeing facial expressions, reading signs and navigating independently in unfamiliar environments).  Sensory Function Pain:   0 / 10 in eye(s) Hearing:  [facility] for conversation in quiet room Touch:  no reports of sensory loss in hands; mobility limited with decreased LE sensation, LE weakness  Visual Acuity   Date Right Dist [place] Right Dist cc Left Dist [place] Left Dist cc Both Dist [place] Both Dist cc   04/06/2024 20/50 20/40 20/80  20/60 20/50 20/40      Near VA: 0.5 M @ 23 cm with added task lighting Visual Fields [place]:    OD: constricted peripherally    OS: constricted peripherally  Global Mental Function     WFL; alert and oriented X 3; good historian; demonstrates appropriate judgment         Physical Function   Falls in Last 90 Days: None Range of Motion (ROM): Impaired: LE weakness Strength:  Impaired: LE weakness Coordination: WFL Posture: WFL Balance: Impaired: sensory impairment related to diabetes Mobility: Quad cane ; arrived to clinic in [facility] wheelchair  Summary Client will benefit from occupational therapy to address compensatory strategies and device use for self-care goals. The patient and/or caregiver(s) present agree with following OT treatment plan of care.   TREATMENT PLAN OF CARE Treatment Goals: Today's session focus on establishing plan of care.  Lighting: The client will demonstrate appropriate selection and positioning of task lighting to decrease glare and increase ease with near tasks after instruction. Intervention and Goal Status: Goal Initiated: Instruction provided; practice offered. Patient demonstrates appropriate understanding and skill in use of the recommended device or strategy.   Filters: The client will complete trial of use of recommended filters to decrease indoor and outdoor glare and/or photosensitivity. Intervention and Goal Status: Goal Initiated:  Instruction provided; practice offered. Patient demonstrates appropriate understanding and skill in use of the recommended device or strategy. Maximize Vision: The client will verbalize understanding of BLOCC strategies to improve clarity of reading material and to reduce glare. Intervention  and Goal Status: Goal Initiated: Instruction handout provided. Patient demonstrates appropriate understanding and skill in use of BLOCC strategies (brightness/glare control, lighting, object size, color contrast, visual clutter) as practiced in clinic following demonstration in context of occupation and environmental applications. Electronic Magnification: The client will demonstrate use of assistive technology with electronic magnification (CCTV, Matingly Mouse, portable electronic devices, etc) with independence with increased time.  Intervention and Goal Status: Goal Initiated:  Instruction provided; practice offered. Patient demonstrates appropriate initial  understanding and skill in use of the recommended device or strategy but will benefit from additional practice to master skill and comfort with use.  Device Accessibility & Apps: The client will demonstrate understanding of accessibility options for maximizing visual navigation of computer systems  (Apple, Windows operating system) and apps for tablet or smartphone systems (Apple, Android) with independence with increased time. Intervention and Goal Status: Goal Initiated:  Instruction provided; practice offered. Patient demonstrates appropriate initial understanding and skill in use of the recommended device or strategy but will benefit from additional practice to master skill and comfort with use.  Falls & Injury Prevention: The client will verbalize understanding of strategies or home modifications to decrease risk of falls and/or injury. Intervention and Goal Status: Goal Initiated:  Instruction provided. Patient demonstrates appropriate initial understanding of the recommendations discussed but needs additional practice to master skill and comfort with use.  Home Visit: The client will participate in a home evaluation to increase awareness and use of home modifications, organizational strategies, and vision impairment compensatory strategies to maximize home and self-care independence, and mobility safety.  Intervention and Goal Status:  Goal Initiated This patient has been referred by the OD to [facility] for home visit(s) with Occupational Therapy to address the home evaluation needs. Client educated on environmental modification techniques. Safety Strategies: The client will verbalize understanding of safety strategies and activity modifications to improve safety while performing household, kitchen and grooming related tasks.   Intervention and Goal Status: Goal Initiated:  Instruction provided. Patient needs additional practice to master skill and comfort with use. Follow-up/Carry-over:  Written information has been provided on all topics discussed. The client and/or caregiver(s) verbalized that POC was presented and they agree with POC.  They verbalize understanding of all verbal information and handout information provided. Transportation and access to clinic will be provided by the client if appropriate or by caregivers.   Rehabilitation Potential: Good OT Intervention Today:  Lighting:  Demonstrate and educated on proper selection and positioning of prescribed lighting options to help maximize functional vision and occupational performance. Ordering information provided based off the following client preferences: Style: LED gooseneck table lamp and Side light flashlight Brightness: >1000 lumens Color Temperature / Light Appearance: 5000 - 6000 K (Cool or Daylight) and Variable / Adjustable  Filters:  Demonstrated and educated on proper selection of filters and filter options to help address individual needs. Ordering information provided based off the following client preferences for: Color: Amber / Hazelnut Style: fitover sunglasses Brand: Cocoons Size: L (Pilot) for current glasses Educated on: Application of blue light blocking for glare reduction., Indications and contraindications for use of polarized lenses.  , and Strategies to manage photostress when transitioning between environments with different levels of brightness.   Conventional Magnifiers: currently uses a non-illum hand held mag, approx 2.5X round Demonstrated use of electronic video magnifier / phone mag app to compare to hand held mag. She prefers her phone app.  Electronic Video Magnifiers & OCR Devices:  EVM: Demo and practice with portable video magnifier options available in clinic. Client expressed greatest appreciation for use of video  magnifier app on smartphone in place of dedicated device   OCR:  Demo and practice with OCR scanner options available in clinic. Client expressed greatest appreciation for Seeing AI app on phone, downloaded. Smartphone / Tablet Accessibility:   Accessibility: Demo, practice, and handout provided. Client expressed greatest appreciation for enlarged font , bold font, high contrast font, and dark mode for increased contrast for letters. Modifications enabled on personal device with consent based off client preference.  Apps: Demo, practice, and handout provided. Client expressed greatest appreciation for WeZoom, Seeing AI, Libby, and [employer] Mobility, Falls & Injury Prevention:  - Fall prevention strategies reviewed related to lighting, glare control, depth perception, and environmental modifications (clutter & contrast).  - Recommended organizational strategies and tools to help reduce hazards in home environment.  - Reviewed and recommended organized search and scan strategies.   - Demo and practice offered with strategies to mark and highlight outside steps with high contrast, anti-skid flagging tape and appropriate lighting solutions (e.g. motion sensor flood lights or solar powered walkway lights). Patient Education & Discussion:  - Review of BLOCC strategies to maximize use of remaining vision.  - Discussed strategies to reduce onset and severity of visual fatigue and eye strain including modified 202020 rule.  Compensatory Techniques:  - Writing: reviewed use of writing aids, high contrast bold or felt tip markers, bold lined paper. Client expressed appreciation for activity modification for check writing. Signature guide dispensed.   - LP: review and demo of large print items including landline phones, check registers, universal remotes, computer keyboards, etc. - Tactile: demo and review of high contrast, tactile marking techniques (bump dots, puff paint, rubber bands, safety  pins, identi-buttons) for product and setting selection.  - Medication Management: demo and review of medication management devices and activity modifications.  Resources for Visual Impairment Provided & Reviewed:  - BLOCC strategies: brightness, lighting, object size, contrast, visual clutter reviewed.     - Low vision aides catalog provided for devices (LS&S, MaxiAids, Independent Living Aids) - home based OT services  Plan of Care: - Client to call and ask questions as needed. - Refer to home-based low vision occupational therapy services: [facility] ([facility]) Date Plan established: 04/06/2024  Frequency:  1 visit   1-2 Time(s) / 4 to 6 months Alva PARAS. Tripp, MS, OTR/L, CLVT, CDRS [facility] Occupational Therapy [facility]   If client does not return for follow up visit(s) related to this episode of care, this note will serve as their discharge note from occupational therapy.  If patient returns to clinic with variance in plan of care, then it may be attributable to one or more of the following factors: preferred clinician availability, appointment time request availability, major holiday with clinic closure, caregiver availability, patient transportation, conflicting medical appointment, inclement weather, patient illness, and/or scheduling error. Clinical Notes on MyChart: Progress notes documented by your healthcare team are available on the [facility] MyChart portal.  We believe that patients should be an integral part of the healthcare team.  We encourage you to review notes after visits and in preparation for upcoming appointments.  This provides the opportunity to review recommendations as well as to prepare questions for your healthcare team to address during your next visit.  If you identify discrepancies in the documentation or have specific questions related to the notes,  please bring them to your next scheduled visit to discuss with  your Occupational Therapist (OT).  With increased transparency, our hope is that we create more trust, better communication, more shared decision-making, and increased satisfaction.

## 2024-04-11 NOTE — Addendum Note (Signed)
 Addended by: ROARK NEITHER on: 04/11/2024 12:40 AM  Modules accepted: Orders

## 2024-06-04 ENCOUNTER — Other Ambulatory Visit: Payer: Self-pay | Admitting: Cardiovascular Disease

## 2024-07-19 ENCOUNTER — Other Ambulatory Visit: Payer: Self-pay | Admitting: Internal Medicine

## 2024-07-19 DIAGNOSIS — Z1231 Encounter for screening mammogram for malignant neoplasm of breast: Secondary | ICD-10-CM

## 2024-07-20 ENCOUNTER — Other Ambulatory Visit: Payer: Self-pay | Admitting: Internal Medicine

## 2024-07-20 DIAGNOSIS — Z13820 Encounter for screening for osteoporosis: Secondary | ICD-10-CM
